# Patient Record
Sex: Female | Born: 1945 | Race: Black or African American | Hispanic: No | Smoking: Former smoker
Health system: Southern US, Community
[De-identification: ages and names within clinical notes are randomized; demographics above are authoritative.]

## PROBLEM LIST (undated history)

## (undated) DIAGNOSIS — M069 Rheumatoid arthritis, unspecified: Secondary | ICD-10-CM

## (undated) DIAGNOSIS — M0520 Rheumatoid vasculitis with rheumatoid arthritis of unspecified site: Secondary | ICD-10-CM

## (undated) DIAGNOSIS — I10 Essential (primary) hypertension: Secondary | ICD-10-CM

## (undated) DIAGNOSIS — M1990 Unspecified osteoarthritis, unspecified site: Secondary | ICD-10-CM

## (undated) DIAGNOSIS — I779 Disorder of arteries and arterioles, unspecified: Secondary | ICD-10-CM

## (undated) DIAGNOSIS — I739 Peripheral vascular disease, unspecified: Secondary | ICD-10-CM

## (undated) DIAGNOSIS — K922 Gastrointestinal hemorrhage, unspecified: Secondary | ICD-10-CM

## (undated) DIAGNOSIS — N329 Bladder disorder, unspecified: Secondary | ICD-10-CM

## (undated) DIAGNOSIS — L309 Dermatitis, unspecified: Secondary | ICD-10-CM

## (undated) DIAGNOSIS — I509 Heart failure, unspecified: Secondary | ICD-10-CM

## (undated) DIAGNOSIS — K635 Polyp of colon: Secondary | ICD-10-CM

## (undated) DIAGNOSIS — I2510 Atherosclerotic heart disease of native coronary artery without angina pectoris: Secondary | ICD-10-CM

## (undated) DIAGNOSIS — E7800 Pure hypercholesterolemia, unspecified: Secondary | ICD-10-CM

## (undated) HISTORY — DX: Dermatitis, unspecified: L30.9

## (undated) HISTORY — PX: CAROTID ENDARTERECTOMY: SUR193

## (undated) HISTORY — PX: CORONARY ANGIOPLASTY WITH STENT PLACEMENT: SHX49

---

## 2008-01-17 ENCOUNTER — Emergency Department (HOSPITAL_BASED_OUTPATIENT_CLINIC_OR_DEPARTMENT_OTHER): Admission: EM | Admit: 2008-01-17 | Discharge: 2008-01-17 | Payer: Self-pay | Admitting: Emergency Medicine

## 2008-06-09 ENCOUNTER — Emergency Department (HOSPITAL_BASED_OUTPATIENT_CLINIC_OR_DEPARTMENT_OTHER): Admission: EM | Admit: 2008-06-09 | Discharge: 2008-06-09 | Payer: Self-pay | Admitting: Emergency Medicine

## 2009-03-05 ENCOUNTER — Emergency Department (HOSPITAL_BASED_OUTPATIENT_CLINIC_OR_DEPARTMENT_OTHER): Admission: EM | Admit: 2009-03-05 | Discharge: 2009-03-05 | Payer: Self-pay | Admitting: Emergency Medicine

## 2009-09-29 ENCOUNTER — Emergency Department (HOSPITAL_BASED_OUTPATIENT_CLINIC_OR_DEPARTMENT_OTHER)
Admission: EM | Admit: 2009-09-29 | Discharge: 2009-09-29 | Payer: Self-pay | Source: Home / Self Care | Admitting: Emergency Medicine

## 2010-06-30 ENCOUNTER — Ambulatory Visit (INDEPENDENT_AMBULATORY_CARE_PROVIDER_SITE_OTHER)
Admission: RE | Admit: 2010-06-30 | Discharge: 2010-06-30 | Payer: Self-pay | Source: Home / Self Care | Attending: Obstetrics and Gynecology | Admitting: Obstetrics and Gynecology

## 2010-06-30 DIAGNOSIS — Z1231 Encounter for screening mammogram for malignant neoplasm of breast: Secondary | ICD-10-CM

## 2010-08-19 LAB — COMPREHENSIVE METABOLIC PANEL
ALT: 27 U/L (ref 0–35)
BUN: 16 mg/dL (ref 6–23)
CO2: 26 mEq/L (ref 19–32)
Calcium: 9.4 mg/dL (ref 8.4–10.5)
Creatinine, Ser: 0.9 mg/dL (ref 0.4–1.2)
GFR calc non Af Amer: >60 mL/min (ref >60)
Glucose, Bld: 83 mg/dL (ref 70–99)
Sodium: 142 mEq/L (ref 135–145)

## 2010-08-19 LAB — DIFFERENTIAL
Eosinophils Absolute: 0.1 10*3/uL (ref 0.0–0.7)
Lymphs Abs: 1.4 10*3/uL (ref 0.7–4.0)
Neutro Abs: 3 10*3/uL (ref 1.7–7.7)
Neutrophils Relative %: 59 % (ref 43–77)

## 2010-08-19 LAB — URINALYSIS, ROUTINE W REFLEX MICROSCOPIC
Bilirubin Urine: NEGATIVE
Glucose, UA: NEGATIVE mg/dL
Hgb urine dipstick: NEGATIVE
Ketones, ur: NEGATIVE mg/dL
Specific Gravity, Urine: 1.009 (ref 1.005–1.030)
pH: 7 (ref 5.0–8.0)

## 2010-08-19 LAB — CBC
HCT: 37.2 % (ref 36.0–46.0)
Hemoglobin: 12.4 g/dL (ref 12.0–15.0)
MCHC: 33.3 g/dL (ref 30.0–36.0)
MCV: 92.2 fL (ref 78.0–100.0)
RBC: 4.03 MIL/uL (ref 3.87–5.11)
RDW: 14.3 % (ref 11.5–15.5)

## 2010-08-19 LAB — POCT CARDIAC MARKERS
CKMB, poc: 2.8 ng/mL (ref 1.0–8.0)
Myoglobin, poc: 80.1 ng/mL (ref 12–200)

## 2010-09-04 LAB — URINE CULTURE
Colony Count: NO GROWTH
Culture: NO GROWTH

## 2010-09-04 LAB — URINALYSIS, ROUTINE W REFLEX MICROSCOPIC
Glucose, UA: NEGATIVE mg/dL
Hgb urine dipstick: NEGATIVE
Ketones, ur: NEGATIVE mg/dL
Protein, ur: NEGATIVE mg/dL

## 2010-09-04 LAB — URINE MICROSCOPIC-ADD ON

## 2011-05-21 ENCOUNTER — Other Ambulatory Visit: Payer: Self-pay | Admitting: Obstetrics and Gynecology

## 2011-05-21 ENCOUNTER — Other Ambulatory Visit (HOSPITAL_COMMUNITY)
Admission: RE | Admit: 2011-05-21 | Discharge: 2011-05-21 | Disposition: A | Discharge status: Home / Self Care | Payer: Medicare Other | Source: Ambulatory Visit | Attending: Obstetrics and Gynecology | Admitting: Obstetrics and Gynecology

## 2011-05-21 DIAGNOSIS — Z1231 Encounter for screening mammogram for malignant neoplasm of breast: Secondary | ICD-10-CM

## 2011-05-21 DIAGNOSIS — Z124 Encounter for screening for malignant neoplasm of cervix: Secondary | ICD-10-CM | POA: Insufficient documentation

## 2011-07-02 ENCOUNTER — Inpatient Hospital Stay (HOSPITAL_BASED_OUTPATIENT_CLINIC_OR_DEPARTMENT_OTHER): Admission: RE | Admit: 2011-07-02 | Payer: Medicare Other | Source: Ambulatory Visit

## 2011-07-08 ENCOUNTER — Ambulatory Visit (HOSPITAL_BASED_OUTPATIENT_CLINIC_OR_DEPARTMENT_OTHER)
Admission: RE | Admit: 2011-07-08 | Discharge: 2011-07-08 | Disposition: A | Discharge status: Home / Self Care | Payer: Medicare Other | Source: Ambulatory Visit | Attending: Obstetrics and Gynecology | Admitting: Obstetrics and Gynecology

## 2011-07-08 DIAGNOSIS — Z1231 Encounter for screening mammogram for malignant neoplasm of breast: Secondary | ICD-10-CM

## 2011-08-05 ENCOUNTER — Ambulatory Visit (HOSPITAL_BASED_OUTPATIENT_CLINIC_OR_DEPARTMENT_OTHER): Payer: Medicare Other

## 2011-10-28 ENCOUNTER — Encounter (HOSPITAL_BASED_OUTPATIENT_CLINIC_OR_DEPARTMENT_OTHER): Payer: Self-pay

## 2011-10-28 ENCOUNTER — Emergency Department (HOSPITAL_BASED_OUTPATIENT_CLINIC_OR_DEPARTMENT_OTHER)
Admission: EM | Admit: 2011-10-28 | Discharge: 2011-10-28 | Disposition: A | Discharge status: Home / Self Care | Payer: Medicare Other | Attending: Emergency Medicine | Admitting: Emergency Medicine

## 2011-10-28 ENCOUNTER — Emergency Department (HOSPITAL_BASED_OUTPATIENT_CLINIC_OR_DEPARTMENT_OTHER): Payer: Medicare Other

## 2011-10-28 DIAGNOSIS — I10 Essential (primary) hypertension: Secondary | ICD-10-CM | POA: Insufficient documentation

## 2011-10-28 DIAGNOSIS — I779 Disorder of arteries and arterioles, unspecified: Secondary | ICD-10-CM | POA: Insufficient documentation

## 2011-10-28 DIAGNOSIS — Z9861 Coronary angioplasty status: Secondary | ICD-10-CM | POA: Insufficient documentation

## 2011-10-28 DIAGNOSIS — J329 Chronic sinusitis, unspecified: Secondary | ICD-10-CM | POA: Insufficient documentation

## 2011-10-28 HISTORY — DX: Disorder of arteries and arterioles, unspecified: I77.9

## 2011-10-28 HISTORY — DX: Pure hypercholesterolemia, unspecified: E78.00

## 2011-10-28 HISTORY — DX: Essential (primary) hypertension: I10

## 2011-10-28 HISTORY — DX: Peripheral vascular disease, unspecified: I73.9

## 2011-10-28 NOTE — ED Notes (Signed)
C/o blurred vision, dizzy x 6-7 min this am 1130am-HA started after s/s-denies visual disturbance at present

## 2011-10-28 NOTE — ED Notes (Signed)
Patient transported to CT 

## 2011-10-28 NOTE — ED Notes (Signed)
Pt denies dizziness at present "my head just hurts"-fast paced steady gait to tx area-EDP Ray in to see pt upon arrival to tx #11

## 2011-10-28 NOTE — ED Provider Notes (Signed)
History     CSN: [id number]  Arrival date & time 10/28/11  1552   First MD Initiated Contact with Patient 10/28/11 1601      Chief Complaint  Patient presents with  . Headache    (Consider location/radiation/quality/duration/timing/severity/associated sxs/prior treatment) HPI  Patient with episode of blurred vision describes as being slightly off balance lasted about 6 minutes today at 1100.  Patient then began having headache behind forehead at about 6/10 without radiation.  Headache improved now to 3 without intervention.  No associated symptoms.  Patient had a similar episode last year but didn't find anything wrong.  PMD Dr. Jari Favre at [facility].  Patient has history of sinusitis, patient has had recent congestion and nasal discharge.  No fever.  Takes zyrtec daily.   Past Medical History  Diagnosis Date  . Carotid arterial disease   . Hypertension   . High cholesterol     Past Surgical History  Procedure Date  . Coronary angioplasty with stent placement     No family history on file.  History  Substance Use Topics  . Smoking status: Never Smoker   . Smokeless tobacco: Not on file  . Alcohol Use: No    OB History    Grav Para Term Preterm Abortions TAB SAB Ect Mult Living                  Review of Systems  Constitutional: Negative.   HENT: Positive for congestion.   Respiratory: Negative.   Cardiovascular: Negative.   Gastrointestinal: Negative.   Genitourinary: Negative.   Musculoskeletal: Negative.   Neurological: Positive for dizziness and headaches. Negative for tremors, seizures, syncope, facial asymmetry, speech difficulty, weakness, light-headedness and numbness.  Hematological: Negative.   Psychiatric/Behavioral: Negative.   All other systems reviewed and are negative.    Allergies  Review of patient's allergies indicates no known allergies.  Home Medications  No current outpatient prescriptions on file.  BP 132/50  Pulse 56   Temp(Src) 98.6 F (37 C) (Oral)  Resp 18  Ht 5\' 8"  (1.727 m)  Wt 175 lb (79.379 kg)  BMI 26.61 kg/m2  SpO2 99%  Physical Exam  Nursing note and vitals reviewed. Constitutional: She is oriented to person, place, and time. She appears well-developed and well-nourished.  HENT:  Head: Normocephalic and atraumatic.  Eyes: Conjunctivae and EOM are normal. Pupils are equal, round, and reactive to light.  Neck: Normal range of motion. Neck supple.       Bilateral bruits  Cardiovascular: Normal rate, regular rhythm, normal heart sounds and intact distal pulses.   Pulmonary/Chest: Effort normal and breath sounds normal.  Abdominal: Soft. Bowel sounds are normal.  Musculoskeletal: Normal range of motion.  Neurological: She is alert and oriented to person, place, and time. She has normal strength and normal reflexes. No sensory deficit. She displays a negative Romberg sign. GCS eye subscore is 4. GCS verbal subscore is 5. GCS motor subscore is 6.  Skin: Skin is warm and dry.  Psychiatric: She has a normal mood and affect. Thought content normal.    ED Course  Procedures (including critical care time)  Labs Reviewed - No data to display Ct Head Wo Contrast  10/28/2011  *RADIOLOGY REPORT*  Clinical Data: Frontal headache  CT HEAD WITHOUT CONTRAST  Technique:  Contiguous axial images were obtained from the base of the skull through the vertex without contrast.  Comparison: None.  Findings: No mass effect, midline shift, or acute intracranial hemorrhage.  Mild  chronic ischemic changes in the periventricular white matter.  Mild global atrophy.  Mastoid air cells are clear. Cranium is intact.  Visualized paranasal sinuses are clear.  IMPRESSION: No acute intracranial pathology.  Original Report Authenticated By: Donavan Burnet, M.D.     No diagnosis found.    MDM   Patient with known carotid disease. She is followed for this. Symptoms today do not appear to been consistent with stroke. They  resolved after 6 minutes and she has a normal neurological exam at this time. Symptoms associated with headache but without acute sinusitis on scan. I think her headache is likely associated with some sinus inflammation from her seasonal allergies. Patient is advised to return or she has any return of symptoms. CT scan results discussed with patient. She will call her primary care Dr.      Hilario Quarry, MD 10/28/11 [phone]

## 2011-10-28 NOTE — Discharge Instructions (Signed)
Headache and Allergies  The relationship between allergies and headaches is unclear. Many people with allergic or infectious nasal problems also have headaches (migraines or sinus headaches). However, sometimes allergies can cause pressure that feels like a headache, and sometimes headaches can cause allergy-like symptoms. It is not always clear whether your symptoms are caused by allergies or by a headache.  CAUSES    Migraine: The cause of a migraine is not always known.   Sinus Headache: The cause of a sinus headache may be a sinus infection. Other conditions that may be related to sinus headaches include:   Hay fever (allergic rhinitis).   Deviation of the nasal septum.   Swelling or clogging of the nasal passages.  SYMPTOMS   Migraine headache symptoms (which often last 4 to 72 hours) include:   Intense, throbbing pain on one or both sides of the head.   Nausea.   Vomiting.   Being extra sensitive to light.   Being extra sensitive to sound.   Nervous system reactions that appear similar to an allergic reaction:   Stuffy nose.   Runny nose.   Tearing.  Sinus headaches are felt as facial pain or pressure.   DIAGNOSIS   Because there is some overlap in symptoms, sinus and migraine headaches are often misdiagnosed. For example, a person with migraines may also feel facial pressure. Likewise, many people with hay fever may get migraine headaches rather than sinus headaches. These migraines can be triggered by the histamine release during an allergic reaction. An antihistamine medicine can eliminate this pain.  There are standard criteria that help clarify the difference between these headaches and related allergy or allergy-like symptoms. Your caregiver can use these criteria to determine the proper diagnosis and provide you the best care.  TREATMENT   Migraine medicine may help people who have persistent migraine headaches even though their hay fever is controlled. For some people, anti-inflammatory  treatments do not work to relieve migraines. Medicines called triptans (such as sumatriptan) can be helpful for those people.  Document Released: 08/08/2003 Document Revised: 05/07/2011 Document Reviewed: 08/30/2009  [employer] Patient Information 2012 [employer].

## 2011-12-01 ENCOUNTER — Emergency Department (HOSPITAL_BASED_OUTPATIENT_CLINIC_OR_DEPARTMENT_OTHER): Payer: Medicare Other

## 2011-12-01 ENCOUNTER — Emergency Department (HOSPITAL_BASED_OUTPATIENT_CLINIC_OR_DEPARTMENT_OTHER)
Admission: EM | Admit: 2011-12-01 | Discharge: 2011-12-01 | Disposition: A | Discharge status: Home / Self Care | Payer: Medicare Other | Attending: Emergency Medicine | Admitting: Emergency Medicine

## 2011-12-01 ENCOUNTER — Encounter (HOSPITAL_BASED_OUTPATIENT_CLINIC_OR_DEPARTMENT_OTHER): Payer: Self-pay | Admitting: Emergency Medicine

## 2011-12-01 DIAGNOSIS — I10 Essential (primary) hypertension: Secondary | ICD-10-CM | POA: Insufficient documentation

## 2011-12-01 DIAGNOSIS — E7800 Pure hypercholesterolemia, unspecified: Secondary | ICD-10-CM | POA: Insufficient documentation

## 2011-12-01 DIAGNOSIS — M25569 Pain in unspecified knee: Secondary | ICD-10-CM | POA: Insufficient documentation

## 2011-12-01 DIAGNOSIS — Z79899 Other long term (current) drug therapy: Secondary | ICD-10-CM | POA: Insufficient documentation

## 2011-12-01 DIAGNOSIS — M25562 Pain in left knee: Secondary | ICD-10-CM

## 2011-12-01 MED ORDER — HYDROCODONE-ACETAMINOPHEN 5-500 MG PO TABS: 1.0000 | ORAL_TABLET | Freq: Four times a day (QID) | ORAL | Status: AC | PRN

## 2011-12-01 NOTE — ED Provider Notes (Signed)
History     CSN: [id number]  Arrival date & time 12/01/11  0900   First MD Initiated Contact with Patient 12/01/11 [phone]      Chief Complaint  Patient presents with  . Knee Pain    (Consider location/radiation/quality/duration/timing/severity/associated sxs/prior treatment) HPI Comments: Started yesterday with knee pain.  No injury or trauma.  Does part-time work as in Microbiologist.  Went to work yesterday and started then.  Patient is a 66 y.o. female presenting with knee pain. The history is provided by the patient.  Knee Pain This is a new problem. The current episode started yesterday. The problem occurs constantly. The problem has been gradually worsening. The symptoms are aggravated by walking (range of motion). Nothing relieves the symptoms. Treatments tried: aleve. The treatment provided mild relief.    Past Medical History  Diagnosis Date  . Carotid arterial disease   . Hypertension   . High cholesterol     Past Surgical History  Procedure Date  . Coronary angioplasty with stent placement     No family history on file.  History  Substance Use Topics  . Smoking status: Never Smoker   . Smokeless tobacco: Not on file  . Alcohol Use: No    OB History    Grav Para Term Preterm Abortions TAB SAB Ect Mult Living                  Review of Systems  All other systems reviewed and are negative.    Allergies  Review of patient's allergies indicates no known allergies.  Home Medications   Current Outpatient Rx  Name Route Sig Dispense Refill  . CELECOXIB 50 MG PO CAPS Oral Take 50 mg by mouth daily.    . ATORVASTATIN CALCIUM 80 MG PO TABS Oral Take 80 mg by mouth daily.    Marland Kitchen ZYRTEC PO Oral Take 1 tablet by mouth daily as needed. Patient is using this medication for her allergies.    Marland Kitchen CLOPIDOGREL BISULFATE 75 MG PO TABS Oral Take 75 mg by mouth daily.    Marland Kitchen EZETIMIBE 10 MG PO TABS Oral Take 10 mg by mouth daily.    . ISOSORBIDE MONONITRATE ER 30 MG PO  TB24 Oral Take 30 mg by mouth daily.    Marland Kitchen METOPROLOL SUCCINATE ER 50 MG PO TB24 Oral Take 50 mg by mouth daily. Take with or immediately following a meal.      BP 150/62  Pulse 55  Temp 98.2 F (36.8 C) (Oral)  Resp 16  SpO2 100%  Physical Exam  Nursing note and vitals reviewed. Constitutional: She is oriented to person, place, and time. She appears well-developed and well-nourished. No distress.  HENT:  Head: Normocephalic and atraumatic.  Neck: Normal range of motion. Neck supple.  Musculoskeletal:       The left knee appears grossly normal.  There is no deformity or effusion.  There is pain with range of motion but no crepitus.  It is stable ap and laterally.  Neurological: She is alert and oriented to person, place, and time.  Skin: Skin is warm and dry. She is not diaphoretic.    ED Course  Procedures (including critical care time)  Labs Reviewed - No data to display No results found.   No diagnosis found.    MDM  The xrays show mild djd.  Will discharge to home with nsaids, lortab.  To follow up if not improving in the next week.  Geoffery Lyons, MD 12/01/11 1013

## 2011-12-01 NOTE — Discharge Instructions (Signed)
Knee Pain The knee is the complex joint between your thigh and your lower leg. It is made up of bones, tendons, ligaments, and cartilage. The bones that make up the knee are:  The femur in the thigh.   The tibia and fibula in the lower leg.   The patella or kneecap riding in the groove on the lower femur.  CAUSES  Knee pain is a common complaint with many causes. A few of these causes are:  Injury, such as:   A ruptured ligament or tendon injury.   Torn cartilage.   Medical conditions, such as:   Gout   Arthritis   Infections   Overuse, over training or overdoing a physical activity.  Knee pain can be minor or severe. Knee pain can accompany debilitating injury. Minor knee problems often respond well to self-care measures or get well on their own. More serious injuries may need medical intervention or even surgery. SYMPTOMS The knee is complex. Symptoms of knee problems can vary widely. Some of the problems are:  Pain with movement and weight bearing.   Swelling and tenderness.   Buckling of the knee.   Inability to straighten or extend your knee.   Your knee locks and you cannot straighten it.   Warmth and redness with pain and fever.   Deformity or dislocation of the kneecap.  DIAGNOSIS  Determining what is wrong may be very straight forward such as when there is an injury. It can also be challenging because of the complexity of the knee. Tests to make a diagnosis may include:  Your caregiver taking a history and doing a physical exam.   Routine X-rays can be used to rule out other problems. X-rays will not reveal a cartilage tear. Some injuries of the knee can be diagnosed by:   Arthroscopy a surgical technique by which a small video camera is inserted through tiny incisions on the sides of the knee. This procedure is used to examine and repair internal knee joint problems. Tiny instruments can be used during arthroscopy to repair the torn knee cartilage  (meniscus).   Arthrography is a radiology technique. A contrast liquid is directly injected into the knee joint. Internal structures of the knee joint then become visible on X-ray film.   An MRI scan is a non x-ray radiology procedure in which magnetic fields and a computer produce two- or three-dimensional images of the inside of the knee. Cartilage tears are often visible using an MRI scanner. MRI scans have largely replaced arthrography in diagnosing cartilage tears of the knee.   Blood work.   Examination of the fluid that helps to lubricate the knee joint (synovial fluid). This is done by taking a sample out using a needle and a syringe.  TREATMENT The treatment of knee problems depends on the cause. Some of these treatments are:  Depending on the injury, proper casting, splinting, surgery or physical therapy care will be needed.   Give yourself adequate recovery time. Do not overuse your joints. If you begin to get sore during workout routines, back off. Slow down or do fewer repetitions.   For repetitive activities such as cycling or running, maintain your strength and nutrition.   Alternate muscle groups. For example if you are a weight lifter, work the upper body on one day and the lower body the next.   Either tight or weak muscles do not give the proper support for your knee. Tight or weak muscles do not absorb the stress placed   on the knee joint. Keep the muscles surrounding the knee strong.   Take care of mechanical problems.   If you have flat feet, orthotics or special shoes may help. See your caregiver if you need help.   Arch supports, sometimes with wedges on the inner or outer aspect of the heel, can help. These can shift pressure away from the side of the knee most bothered by osteoarthritis.   A brace called an "unloader" brace also may be used to help ease the pressure on the most arthritic side of the knee.   If your caregiver has prescribed crutches, braces,  wraps or ice, use as directed. The acronym for this is PRICE. This means protection, rest, ice, compression and elevation.   Nonsteroidal anti-inflammatory drugs (NSAID's), can help relieve pain. But if taken immediately after an injury, they may actually increase swelling. Take NSAID's with food in your stomach. Stop them if you develop stomach problems. Do not take these if you have a history of ulcers, stomach pain or bleeding from the bowel. Do not take without your caregiver's approval if you have problems with fluid retention, heart failure, or kidney problems.   For ongoing knee problems, physical therapy may be helpful.   Glucosamine and chondroitin are over-the-counter dietary supplements. Both may help relieve the pain of osteoarthritis in the knee. These medicines are different from the usual anti-inflammatory drugs. Glucosamine may decrease the rate of cartilage destruction.   Injections of a corticosteroid drug into your knee joint may help reduce the symptoms of an arthritis flare-up. They may provide pain relief that lasts a few months. You may have to wait a few months between injections. The injections do have a small increased risk of infection, water retention and elevated blood sugar levels.   Hyaluronic acid injected into damaged joints may ease pain and provide lubrication. These injections may work by reducing inflammation. A series of shots may give relief for as long as 6 months.   Topical painkillers. Applying certain ointments to your skin may help relieve the pain and stiffness of osteoarthritis. Ask your pharmacist for suggestions. Many over the-counter products are approved for temporary relief of arthritis pain.   In some countries, doctors often prescribe topical NSAID's for relief of chronic conditions such as arthritis and tendinitis. A review of treatment with NSAID creams found that they worked as well as oral medications but without the serious side effects.    PREVENTION  Maintain a healthy weight. Extra pounds put more strain on your joints.   Get strong, stay limber. Weak muscles are a common cause of knee injuries. Stretching is important. Include flexibility exercises in your workouts.   Be smart about exercise. If you have osteoarthritis, chronic knee pain or recurring injuries, you may need to change the way you exercise. This does not mean you have to stop being active. If your knees ache after jogging or playing basketball, consider switching to swimming, water aerobics or other low-impact activities, at least for a few days a week. Sometimes limiting high-impact activities will provide relief.   Make sure your shoes fit well. Choose footwear that is right for your sport.   Protect your knees. Use the proper gear for knee-sensitive activities. Use kneepads when playing volleyball or laying carpet. Buckle your seat belt every time you drive. Most shattered kneecaps occur in car accidents.   Rest when you are tired.  SEEK MEDICAL CARE IF:  You have knee pain that is continual and does not   seem to be getting better.  SEEK IMMEDIATE MEDICAL CARE IF:  Your knee joint feels hot to the touch and you have a high fever. MAKE SURE YOU:   Understand these instructions.   Will watch your condition.   Will get help right away if you are not doing well or get worse.  Document Released: 03/15/2007 Document Revised: 05/07/2011 Document Reviewed: 03/15/2007 [employer] Patient Information 2012 [employer]. 

## 2011-12-01 NOTE — ED Notes (Signed)
Pt c/o LT knee pain since yest; no injury

## 2012-05-27 ENCOUNTER — Emergency Department (HOSPITAL_BASED_OUTPATIENT_CLINIC_OR_DEPARTMENT_OTHER)
Admission: EM | Admit: 2012-05-27 | Discharge: 2012-05-27 | Disposition: A | Discharge status: Home / Self Care | Payer: Medicare Other | Attending: Emergency Medicine | Admitting: Emergency Medicine

## 2012-05-27 ENCOUNTER — Encounter (HOSPITAL_BASED_OUTPATIENT_CLINIC_OR_DEPARTMENT_OTHER): Payer: Self-pay | Admitting: *Deleted

## 2012-05-27 ENCOUNTER — Emergency Department (HOSPITAL_BASED_OUTPATIENT_CLINIC_OR_DEPARTMENT_OTHER): Payer: Medicare Other

## 2012-05-27 DIAGNOSIS — R6883 Chills (without fever): Secondary | ICD-10-CM | POA: Insufficient documentation

## 2012-05-27 DIAGNOSIS — R5381 Other malaise: Secondary | ICD-10-CM | POA: Insufficient documentation

## 2012-05-27 DIAGNOSIS — R05 Cough: Secondary | ICD-10-CM | POA: Insufficient documentation

## 2012-05-27 DIAGNOSIS — J329 Chronic sinusitis, unspecified: Secondary | ICD-10-CM

## 2012-05-27 DIAGNOSIS — R059 Cough, unspecified: Secondary | ICD-10-CM | POA: Insufficient documentation

## 2012-05-27 DIAGNOSIS — Z7902 Long term (current) use of antithrombotics/antiplatelets: Secondary | ICD-10-CM | POA: Insufficient documentation

## 2012-05-27 DIAGNOSIS — N390 Urinary tract infection, site not specified: Secondary | ICD-10-CM

## 2012-05-27 DIAGNOSIS — E860 Dehydration: Secondary | ICD-10-CM | POA: Insufficient documentation

## 2012-05-27 DIAGNOSIS — J3489 Other specified disorders of nose and nasal sinuses: Secondary | ICD-10-CM | POA: Insufficient documentation

## 2012-05-27 DIAGNOSIS — J029 Acute pharyngitis, unspecified: Secondary | ICD-10-CM | POA: Insufficient documentation

## 2012-05-27 DIAGNOSIS — Z79899 Other long term (current) drug therapy: Secondary | ICD-10-CM | POA: Insufficient documentation

## 2012-05-27 DIAGNOSIS — I6529 Occlusion and stenosis of unspecified carotid artery: Secondary | ICD-10-CM | POA: Insufficient documentation

## 2012-05-27 DIAGNOSIS — E7800 Pure hypercholesterolemia, unspecified: Secondary | ICD-10-CM | POA: Insufficient documentation

## 2012-05-27 DIAGNOSIS — Z9861 Coronary angioplasty status: Secondary | ICD-10-CM | POA: Insufficient documentation

## 2012-05-27 DIAGNOSIS — R42 Dizziness and giddiness: Secondary | ICD-10-CM | POA: Insufficient documentation

## 2012-05-27 LAB — CBC WITH DIFFERENTIAL/PLATELET
Basophils Relative: 0 % (ref 0–1)
Eosinophils Absolute: 0.1 10*3/uL (ref 0.0–0.7)
MCH: 29.2 pg (ref 26.0–34.0)
MCHC: 32.2 g/dL (ref 30.0–36.0)
Neutrophils Relative %: 53 % (ref 43–77)
Platelets: 218 10*3/uL (ref 150–400)
RDW: 15.5 % (ref 11.5–15.5)

## 2012-05-27 LAB — URINALYSIS, ROUTINE W REFLEX MICROSCOPIC
Glucose, UA: NEGATIVE mg/dL
Ketones, ur: NEGATIVE mg/dL
pH: 6.5 (ref 5.0–8.0)

## 2012-05-27 LAB — COMPREHENSIVE METABOLIC PANEL
ALT: 45 U/L — ABNORMAL HIGH (ref 0–35)
Albumin: 3.1 g/dL — ABNORMAL LOW (ref 3.5–5.2)
Alkaline Phosphatase: 61 U/L (ref 39–117)
BUN: 17 mg/dL (ref 6–23)
Potassium: 4 mEq/L (ref 3.5–5.1)
Sodium: 137 mEq/L (ref 135–145)
Total Protein: 6.5 g/dL (ref 6.0–8.3)

## 2012-05-27 LAB — URINE MICROSCOPIC-ADD ON

## 2012-05-27 MED ORDER — SODIUM CHLORIDE 0.9 % IV BOLUS (SEPSIS)
1000.0000 mL | Freq: Once | INTRAVENOUS | Status: AC
  Administered 2012-05-27: 1000 mL via INTRAVENOUS

## 2012-05-27 MED ORDER — SODIUM CHLORIDE 0.9 % IV BOLUS (SEPSIS): 1000.0000 mL | Freq: Once | INTRAVENOUS | Status: DC

## 2012-05-27 MED ORDER — AMOXICILLIN-POT CLAVULANATE 875-125 MG PO TABS: 1.0000 | ORAL_TABLET | Freq: Two times a day (BID) | ORAL | Status: DC

## 2012-05-27 NOTE — ED Notes (Signed)
Pt amb to triage with quick steady gait in nad. Pt reports she checked her bp at home on her machine and it was 70/30. Pt states she has had a cold this week for which her pcp rx antibiotics, and that she has a headache.

## 2012-05-27 NOTE — ED Notes (Signed)
Pt. Has no complaints of nausea or vomiting or diarrhea.  Pt. Has cool dry skin and is in no distress.

## 2012-05-27 NOTE — ED Notes (Signed)
Pt. Daughter left to go out for food.  Pt. Daughter aware of Pt. Needs for fluid IV and will return for the Pt. Later..  Pt. Daughter given info for her to call and check on her mother if needed.

## 2012-05-27 NOTE — ED Notes (Signed)
Patient to restroom without difficulty or assistance

## 2012-05-27 NOTE — ED Provider Notes (Addendum)
History     CSN: [id number]  Arrival date & time 05/27/12  1521   First MD Initiated Contact with Patient 05/27/12 1534      Chief Complaint  Patient presents with  . Hypotension    (Consider location/radiation/quality/duration/timing/severity/associated sxs/prior treatment) Patient is a 66 y.o. female presenting with weakness. The history is provided by the patient.  Weakness The primary symptoms include dizziness. Primary symptoms do not include nausea or vomiting. The symptoms began 6 to 12 hours ago. The symptoms are improving. The neurological symptoms are diffuse. Context: States she was sitting in a chair today when she started feeling dizzy. She took her blood pressure and it was 70/30. She did take her Imdur were and Toprol-XL today like normal.  She describes the dizziness as faintness. The dizziness began today. The dizziness has been gradually improving since its onset. It is a new problem. Dizziness also occurs with weakness. Dizziness does not occur with blurred vision, nausea or vomiting.  Additional symptoms include weakness. Additional symptoms do not include loss of balance. Medical issues also include hypertension. Associated medical issues comments: States she took her blood pressure medications today but this has happened in the past when her medications were too high. She states over last week she's had URI symptoms and was placed on penicillin.    Past Medical History  Diagnosis Date  . Carotid arterial disease   . Hypertension   . High cholesterol     Past Surgical History  Procedure Date  . Coronary angioplasty with stent placement     History reviewed. No pertinent family history.  History  Substance Use Topics  . Smoking status: Never Smoker   . Smokeless tobacco: Not on file  . Alcohol Use: No    OB History    Grav Para Term Preterm Abortions TAB SAB Ect Mult Living                  Review of Systems  Constitutional: Positive for chills.  Negative for appetite change.  HENT: Positive for congestion, sore throat, rhinorrhea and sinus pressure.   Eyes: Negative for blurred vision.  Respiratory: Positive for cough. Negative for shortness of breath.   Gastrointestinal: Negative for nausea and vomiting.  Neurological: Positive for dizziness and weakness. Negative for loss of balance.  All other systems reviewed and are negative.    Allergies  Review of patient's allergies indicates no known allergies.  Home Medications   Current Outpatient Rx  Name  Route  Sig  Dispense  Refill  . ATORVASTATIN CALCIUM 80 MG PO TABS   Oral   Take 80 mg by mouth daily.         . CELECOXIB 50 MG PO CAPS   Oral   Take 50 mg by mouth daily.         Marland Kitchen ZYRTEC PO   Oral   Take 1 tablet by mouth daily as needed. Patient is using this medication for her allergies.         Marland Kitchen CLOPIDOGREL BISULFATE 75 MG PO TABS   Oral   Take 75 mg by mouth daily.         Marland Kitchen EZETIMIBE 10 MG PO TABS   Oral   Take 10 mg by mouth daily.         . ISOSORBIDE MONONITRATE ER 30 MG PO TB24   Oral   Take 30 mg by mouth daily.         Marland Kitchen METOPROLOL SUCCINATE  ER 50 MG PO TB24   Oral   Take 50 mg by mouth daily. Take with or immediately following a meal.           BP 92/63  Pulse 62  Temp 99 F (37.2 C) (Oral)  Resp 18  SpO2 99%  Physical Exam  Nursing note and vitals reviewed. Constitutional: She is oriented to person, place, and time. She appears well-developed and well-nourished. No distress.  HENT:  Head: Normocephalic and atraumatic.  Nose: Mucosal edema and rhinorrhea present.  Mouth/Throat: Oropharynx is clear and moist.  Eyes: Conjunctivae normal and EOM are normal. Pupils are equal, round, and reactive to light.  Neck: Normal range of motion. Neck supple.  Cardiovascular: Normal rate, regular rhythm and intact distal pulses.   Murmur heard.  Crescendo systolic murmur is present with a grade of 2/6  Pulmonary/Chest: Effort  normal and breath sounds normal. No respiratory distress. She has no wheezes. She has no rales.  Abdominal: Soft. She exhibits no distension. There is no tenderness. There is no rebound and no guarding.  Musculoskeletal: Normal range of motion. She exhibits no edema and no tenderness.  Neurological: She is alert and oriented to person, place, and time.  Skin: Skin is warm and dry. No rash noted. No erythema.  Psychiatric: She has a normal mood and affect. Her behavior is normal.    ED Course  Procedures (including critical care time)  Labs Reviewed  CBC WITH DIFFERENTIAL - Abnormal; Notable for the following:    Monocytes Relative 16 (*)     All other components within normal limits  COMPREHENSIVE METABOLIC PANEL - Abnormal; Notable for the following:    Albumin 3.1 (*)     ALT 45 (*)     GFR calc non Af Amer 51 (*)     GFR calc Af Amer 59 (*)     All other components within normal limits  URINALYSIS, ROUTINE W REFLEX MICROSCOPIC - Abnormal; Notable for the following:    APPearance CLOUDY (*)     Leukocytes, UA SMALL (*)     All other components within normal limits  URINE MICROSCOPIC-ADD ON - Abnormal; Notable for the following:    Squamous Epithelial / LPF FEW (*)     All other components within normal limits  LACTIC ACID, PLASMA   Dg Chest 2 View  05/27/2012  *RADIOLOGY REPORT*  Clinical Data: Cough and fever.  CHEST - 2 VIEW  Comparison: No priors.  Findings: Lung volumes are normal.  No consolidative airspace disease.  No pleural effusions.  No pneumothorax.  No pulmonary nodule or mass noted.  Pulmonary vasculature and the cardiomediastinal silhouette are within normal limits. Atherosclerosis in the thoracic aorta.  IMPRESSION: 1. No radiographic evidence of acute cardiopulmonary disease. 2.  Atherosclerosis.   Original Report Authenticated By: Trudie Reed, M.D.      1. Dehydration   2. Sinusitis   3. UTI (lower urinary tract infection)       MDM   Patient  states today that she has felt dizzy and checked her blood pressure and it was 70/30. She states for the last week she's had URI symptoms with cough, nasal congestion. She denies fever, shortness of breath, chest pain, abdominal pain, vomiting or diarrhea.  She is well-appearing on exam with a blood pressure of 92/63 with a normal pulse and respiratory rate. She has a low-grade temp of 99.  Patient has taken both of her blood pressure medications today which is most  likely the reason for her hypotension and she has not been drinking as much as she normally does. CBC, CMP, UA, lactic acid, chest x-ray pending. Patient given 1 L of IV fluid to see if blood pressure response and improvement in symptoms. Patient is asymptomatic while laying in bed and was able to walk in without any issues.  5:59 PM Labs are within normal limits. Except for a UA that shows an early urinary tract infection to 3-6 white blood cells and small leukocytes. Patient feels much better after IV fluids and blood pressure has not changed significantly. She has a normal lactate and she is well-appearing. Will switch from penicillin to Augmentin to cover her sinus symptoms as well as urinary tract infection. Will have patient hold her dose of metoprolol tonight and hold all blood pressure medications until tomorrow and then have her check her blood pressure before she takes them. Will have her followup with her PCP on Monday      Gwyneth Sprout, MD 05/27/12 1800  Gwyneth Sprout, MD 05/27/12 1844

## 2012-05-31 ENCOUNTER — Other Ambulatory Visit: Payer: Self-pay | Admitting: Obstetrics and Gynecology

## 2012-05-31 DIAGNOSIS — Z1231 Encounter for screening mammogram for malignant neoplasm of breast: Secondary | ICD-10-CM

## 2012-07-11 ENCOUNTER — Ambulatory Visit (HOSPITAL_BASED_OUTPATIENT_CLINIC_OR_DEPARTMENT_OTHER)
Admission: RE | Admit: 2012-07-11 | Discharge: 2012-07-11 | Disposition: A | Discharge status: Home / Self Care | Payer: Medicare Other | Source: Ambulatory Visit | Attending: Obstetrics and Gynecology | Admitting: Obstetrics and Gynecology

## 2012-07-11 DIAGNOSIS — Z1231 Encounter for screening mammogram for malignant neoplasm of breast: Secondary | ICD-10-CM

## 2012-09-20 ENCOUNTER — Emergency Department (HOSPITAL_BASED_OUTPATIENT_CLINIC_OR_DEPARTMENT_OTHER)
Admission: EM | Admit: 2012-09-20 | Discharge: 2012-09-20 | Disposition: A | Discharge status: Home / Self Care | Payer: Medicare Other | Attending: Emergency Medicine | Admitting: Emergency Medicine

## 2012-09-20 ENCOUNTER — Emergency Department (HOSPITAL_BASED_OUTPATIENT_CLINIC_OR_DEPARTMENT_OTHER): Payer: Medicare Other

## 2012-09-20 ENCOUNTER — Encounter (HOSPITAL_BASED_OUTPATIENT_CLINIC_OR_DEPARTMENT_OTHER): Payer: Self-pay

## 2012-09-20 DIAGNOSIS — I10 Essential (primary) hypertension: Secondary | ICD-10-CM | POA: Insufficient documentation

## 2012-09-20 DIAGNOSIS — E7800 Pure hypercholesterolemia, unspecified: Secondary | ICD-10-CM | POA: Insufficient documentation

## 2012-09-20 DIAGNOSIS — Z7902 Long term (current) use of antithrombotics/antiplatelets: Secondary | ICD-10-CM | POA: Insufficient documentation

## 2012-09-20 DIAGNOSIS — Z8679 Personal history of other diseases of the circulatory system: Secondary | ICD-10-CM | POA: Insufficient documentation

## 2012-09-20 DIAGNOSIS — I951 Orthostatic hypotension: Secondary | ICD-10-CM | POA: Insufficient documentation

## 2012-09-20 DIAGNOSIS — R42 Dizziness and giddiness: Secondary | ICD-10-CM | POA: Insufficient documentation

## 2012-09-20 DIAGNOSIS — R51 Headache: Secondary | ICD-10-CM | POA: Insufficient documentation

## 2012-09-20 DIAGNOSIS — Z79899 Other long term (current) drug therapy: Secondary | ICD-10-CM | POA: Insufficient documentation

## 2012-09-20 LAB — CBC
HCT: 34.2 % — ABNORMAL LOW (ref 36.0–46.0)
MCH: 29.3 pg (ref 26.0–34.0)
MCV: 90.2 fL (ref 78.0–100.0)
Platelets: 224 10*3/uL (ref 150–400)
RBC: 3.79 MIL/uL — ABNORMAL LOW (ref 3.87–5.11)
WBC: 5.5 10*3/uL (ref 4.0–10.5)

## 2012-09-20 LAB — URINALYSIS, ROUTINE W REFLEX MICROSCOPIC
Bilirubin Urine: NEGATIVE
Glucose, UA: NEGATIVE mg/dL
Hgb urine dipstick: NEGATIVE
Ketones, ur: NEGATIVE mg/dL
Nitrite: NEGATIVE
Specific Gravity, Urine: 1.017 (ref 1.005–1.030)
pH: 6 (ref 5.0–8.0)

## 2012-09-20 LAB — BASIC METABOLIC PANEL
BUN: 14 mg/dL (ref 6–23)
CO2: 22 mEq/L (ref 19–32)
Calcium: 9.3 mg/dL (ref 8.4–10.5)
Chloride: 104 mEq/L (ref 96–112)
Creatinine, Ser: 1.1 mg/dL (ref 0.50–1.10)
Glucose, Bld: 126 mg/dL — ABNORMAL HIGH (ref 70–99)

## 2012-09-20 LAB — PRO B NATRIURETIC PEPTIDE: Pro B Natriuretic peptide (BNP): 110.5 pg/mL (ref 0–125)

## 2012-09-20 LAB — TROPONIN I: Troponin I: <0.3 ng/mL (ref <0.30)

## 2012-09-20 MED ORDER — ASPIRIN 81 MG PO CHEW: 324.0000 mg | CHEWABLE_TABLET | Freq: Once | ORAL | Status: DC

## 2012-09-20 MED ORDER — SODIUM CHLORIDE 0.9 % IV BOLUS (SEPSIS)
1000.0000 mL | Freq: Once | INTRAVENOUS | Status: AC
  Administered 2012-09-20: 1000 mL via INTRAVENOUS

## 2012-09-20 MED ORDER — ASPIRIN 325 MG PO TABS: 325.0000 mg | ORAL_TABLET | ORAL | Status: DC

## 2012-09-20 MED ORDER — ASPIRIN 81 MG PO CHEW
CHEWABLE_TABLET | ORAL | Status: AC
  Filled 2012-09-20: qty 4

## 2012-09-20 MED ORDER — IOHEXOL 350 MG/ML SOLN
100.0000 mL | Freq: Once | INTRAVENOUS | Status: AC | PRN
Start: 2012-09-20 — End: 2012-09-20
  Administered 2012-09-20: 100 mL via INTRAVENOUS

## 2012-09-20 MED ORDER — SODIUM CHLORIDE 0.9 % IV BOLUS (SEPSIS): 1000.0000 mL | Freq: Once | INTRAVENOUS | Status: DC

## 2012-09-20 NOTE — ED Notes (Signed)
Dr. Manus Gunning was in the room and knows about the low blood pressure.

## 2012-09-20 NOTE — ED Notes (Signed)
Patient transported to X-ray 

## 2012-09-20 NOTE — ED Notes (Signed)
MD at bedside. 

## 2012-09-20 NOTE — ED Notes (Signed)
Pt states that she been feeling dizzy since Saturday like she was about to pass out.  Pt states that she has also been experiencing low BPs at home (95/42 at one point).  Pt states that she became dizzy and diaphoretic about 1 hour ago.

## 2012-09-20 NOTE — ED Provider Notes (Signed)
History  This chart was scribed for Glynn Octave, MD by Bennett Scrape, ED Scribe. This patient was seen in room MH09/MH09 and the patient's care was started at 3:01 PM.  CSN: [id number]  Arrival date & time 09/20/12  1435   First MD Initiated Contact with Patient 09/20/12 1501      Chief Complaint  Patient presents with  . Near Syncope     The history is provided by the patient. No language interpreter was used.    Sara Villanueva is a 67 y.o. female who presents to the Emergency Department complaining of 2 episodes of near syncope with associated dizziness described as the room spinning and lightheadedness that both occured with changing positons. She states that 3 days ago she felt dizzy and lightheaded while walking around in her bedroom. She sat down with resolvement in the symptoms and had no recurrence of symptoms until about one hour ago. She states that the dizziness is worse with going from sitting position to standing but improves with standing for a few minutes. She denies feeling dizzy or lightheaded currently. She states that she has experienced one prior episode earlier this week and was told by her PCP to decrease her daily HTN medications when she experiences the symptoms. She reports that she has been eating and drinking normally since the onset. She denies fever, neck pain, sore throat, visual disturbance, CP, cough, SOB, abdominal pain, nausea, emesis, diarrhea, urinary symptoms, back pain, HA, weakness, numbness and rash as associated symptoms. She has a h/o CAD with 2 stents (2003, 2007), bilateral carotid blockages (50% on one side, 65% on the other), HTN and HLD.  She denies any prior diagnoses of CHF. Last stress was last year and pt reports that it was normal. She denies smoking and alcohol use.  PCP is Dr. Katrinka Blazing with [facility] 6 months for the carotid blockages  Past Medical History  Diagnosis Date  . Carotid arterial  disease   . Hypertension   . High cholesterol     Past Surgical History  Procedure Laterality Date  . Coronary angioplasty with stent placement      History reviewed. No pertinent family history.  History  Substance Use Topics  . Smoking status: Never Smoker   . Smokeless tobacco: Never Used  . Alcohol Use: No    No OB history provided.  Review of Systems  A complete 10 system review of systems was obtained and all systems are negative except as noted in the HPI and PMH.   Allergies  Review of patient's allergies indicates no known allergies.  Home Medications   Current Outpatient Rx  Name  Route  Sig  Dispense  Refill  . atorvastatin (LIPITOR) 80 MG tablet   Oral   Take 80 mg by mouth daily.         . Cetirizine HCl (ZYRTEC PO)   Oral   Take 1 tablet by mouth daily as needed. Patient is using this medication for her allergies.         Marland Kitchen clopidogrel (PLAVIX) 75 MG tablet   Oral   Take 75 mg by mouth daily.         Marland Kitchen ezetimibe (ZETIA) 10 MG tablet   Oral   Take 10 mg by mouth daily.         . isosorbide mononitrate (IMDUR) 30 MG 24 hr tablet   Oral   Take 30 mg by mouth daily.         Marland Kitchen  METOPROLOL SUCCINATE ER PO   Oral   Take 30 mg by mouth daily.         Marland Kitchen amoxicillin-clavulanate (AUGMENTIN) 875-125 MG per tablet   Oral   Take 1 tablet by mouth every 12 (twelve) hours.   14 tablet   0   . celecoxib (CELEBREX) 50 MG capsule   Oral   Take 50 mg by mouth daily.         . metoprolol succinate (TOPROL-XL) 50 MG 24 hr tablet   Oral   Take 50 mg by mouth daily. Take with or immediately following a meal.           Triage Vitals: BP 105/62  Pulse 88  Temp(Src) 98.3 F (36.8 C) (Oral)  Resp 20  Ht 5\' 8"  (1.727 m)  Wt 175 lb (79.379 kg)  BMI 26.61 kg/m2  SpO2 98%  Physical Exam  Nursing note and vitals reviewed. Constitutional: She is oriented to person, place, and time. She appears well-developed and well-nourished. No  distress.  HENT:  Head: Normocephalic and atraumatic.  Mouth/Throat: Oropharynx is clear and moist.  Eyes: Conjunctivae and EOM are normal. Pupils are equal, round, and reactive to light.  Neck: Neck supple. Carotid bruit is present (right). No tracheal deviation present.  Cardiovascular: Normal rate and regular rhythm.   No murmur heard. Pulmonary/Chest: Effort normal and breath sounds normal. No respiratory distress.  Abdominal: Soft. There is no tenderness.  Musculoskeletal: Normal range of motion. She exhibits no edema.  Neurological: She is alert and oriented to person, place, and time.  No ataxia on finger to nose. Equal grip strengths. 5/5 strength in bilateral lower extremities. Ankle plantar and dorsiflexion intact. Great toe extension intact bilaterally. +2 DP and PT pulses. +2 patellar reflexes bilaterally. Normal gait. Negative Romberg's. No pronator's drift.    Skin: Skin is warm and dry.  Psychiatric: She has a normal mood and affect. Her behavior is normal.    ED Course  Procedures (including critical care time)  Medications  sodium chloride 0.9 % bolus 1,000 mL (1,000 mLs Intravenous New Bag/Given 09/20/12 1647)  sodium chloride 0.9 % bolus 1,000 mL (not administered)  sodium chloride 0.9 % bolus 1,000 mL (0 mLs Intravenous Stopped 09/20/12 1656)  iohexol (OMNIPAQUE) 350 MG/ML injection 100 mL (100 mLs Intravenous Contrast Given 09/20/12 1549)    DIAGNOSTIC STUDIES: Oxygen Saturation is 98% on RA, normal by my interpretation.    COORDINATION OF CARE: 3:17 PM-When pt stood up from a resting position, HR increased to 102. BP was 89/66. Discussed treatment plan which includes CXR, CBC panel, BMP, ASA and troponin with pt at bedside and pt agreed to plan.   4:13 PM-Consult complete with Dr. Katrinka Blazing, pt's PCP. Patient case explained and discussed. Dr. Katrinka Blazing agrees to the symptoms are most likely orthostasis. Advised to have pt call for an appointment tomorrow. Call ended at  4:16 PM.  Labs Reviewed  CBC - Abnormal; Notable for the following:    RBC 3.79 (*)    Hemoglobin 11.1 (*)    HCT 34.2 (*)    All other components within normal limits  BASIC METABOLIC PANEL - Abnormal; Notable for the following:    Glucose, Bld 126 (*)    GFR calc non Af Amer 50 (*)    GFR calc Af Amer 58 (*)    All other components within normal limits  D-DIMER, QUANTITATIVE - Abnormal; Notable for the following:    D-Dimer, Quant 2.49 (*)  All other components within normal limits  TROPONIN I  URINALYSIS, ROUTINE W REFLEX MICROSCOPIC  PRO B NATRIURETIC PEPTIDE  TROPONIN I   Dg Chest 2 View  09/20/2012  *RADIOLOGY REPORT*  Clinical Data: Near syncopal episodes.  Dizziness and diaphoresis.  CHEST - 2 VIEW  Comparison: Two-view chest 05/27/2012  Findings: The heart size is normal.  The lungs are clear.  Mild hyperexpansion is stable.  There is some interstitial coarsening which appears chronic.  The visualized soft tissues and bony thorax are unremarkable.  Atherosclerotic changes are noted.  IMPRESSION:  1.  No acute cardiopulmonary disease or significant interval change. 2.  Stable mild emphysematous disease.   Original Report Authenticated By: Marin Roberts, M.D.    Ct Angio Chest Pe W/cm &/or Wo Cm  09/20/2012  *RADIOLOGY REPORT*  Clinical Data: Chest pain and shortness of breath  CT ANGIOGRAPHY CHEST  Technique:  Multidetector CT imaging of the chest using the standard protocol during bolus administration of intravenous contrast. Multiplanar reconstructed images including MIPs were obtained and reviewed to evaluate the vascular anatomy.  Contrast: OMNIPAQUE IOHEXOL 350 MG/ML SOLN  Comparison: None.  Findings: The lungs are well-aerated bilaterally and demonstrate some emphysematous changes.  No focal infiltrate or sizable effusion is seen.  The thoracic aorta and origins of the great vessels are within normal limits.  No dissection is seen.  The pulmonary artery  demonstrates a normal branching pattern and shows no evidence of pulmonary emboli. Some lymph nodes are noted in the aorticopulmonary window but appeared have normal fatty hila.  The largest of these measures 13 mm in short axis best seen on image number 104 of series 5.  No significant hilar adenopathy is noted. Heavy coronary calcifications are seen  The visualized upper abdomen is within normal limits.  IMPRESSION: No evidence of pulmonary emboli.  Emphysematous changes.   Original Report Authenticated By: Alcide Clever, M.D.      No diagnosis found.    MDM  2 near syncopal episodes the last 3 days with lightheadedness, dizziness diaphoresis and vision darkening. No chest pain or shortness of breath. History of CAD with stents, known carotid occlusions, hypertension and hyperlipidemia.  Labs reviewed unremarkable. Positive orthostatics.  EKG normal sinus rhythm and unchanged. Troponin negative. Initial hypoxia appears to be spurious. CT negative for pulmonary embolism.  Case discussed with patient's PCP Dr. Jari Favre. He agrees she is having orthostasis. Recommends holding her blood pressure medications until tomorrow. She is instructed to call first thing in the morning for an appointment.  After fluid hydration in the ED her blood pressure has much improved to the 130s. Ambulatory in the ED without assistance and tolerating PO.   Date: 09/20/2012  Rate: 82  Rhythm: normal sinus rhythm  QRS Axis: normal  Intervals: normal  ST/T Wave abnormalities: normal  Conduction Disutrbances:none  Narrative Interpretation:   Old EKG Reviewed: unchanged   Date: 09/20/2012  Rate: 70  Rhythm: normal sinus rhythm  QRS Axis: normal  Intervals: normal  ST/T Wave abnormalities: normal  Conduction Disutrbances:none  Narrative Interpretation:   Old EKG Reviewed: unchanged     I personally performed the services described in this documentation, which was scribed in my presence. The recorded  information has been reviewed and is accurate.    Glynn Octave, MD 09/21/12 [phone]

## 2012-09-20 NOTE — ED Notes (Signed)
Attempt blood draw for repeat troponin x 2 RN-M Lottie Rater, RN at [facility]

## 2013-03-21 ENCOUNTER — Emergency Department (HOSPITAL_BASED_OUTPATIENT_CLINIC_OR_DEPARTMENT_OTHER)
Admission: EM | Admit: 2013-03-21 | Discharge: 2013-03-21 | Disposition: A | Discharge status: Home / Self Care | Payer: Medicare Other | Attending: Emergency Medicine | Admitting: Emergency Medicine

## 2013-03-21 ENCOUNTER — Encounter (HOSPITAL_BASED_OUTPATIENT_CLINIC_OR_DEPARTMENT_OTHER): Payer: Self-pay | Admitting: Emergency Medicine

## 2013-03-21 DIAGNOSIS — E7800 Pure hypercholesterolemia, unspecified: Secondary | ICD-10-CM | POA: Insufficient documentation

## 2013-03-21 DIAGNOSIS — Z9861 Coronary angioplasty status: Secondary | ICD-10-CM | POA: Insufficient documentation

## 2013-03-21 DIAGNOSIS — Y939 Activity, unspecified: Secondary | ICD-10-CM | POA: Insufficient documentation

## 2013-03-21 DIAGNOSIS — X12XXXA Contact with other hot fluids, initial encounter: Secondary | ICD-10-CM | POA: Insufficient documentation

## 2013-03-21 DIAGNOSIS — T23209A Burn of second degree of unspecified hand, unspecified site, initial encounter: Secondary | ICD-10-CM | POA: Insufficient documentation

## 2013-03-21 DIAGNOSIS — I10 Essential (primary) hypertension: Secondary | ICD-10-CM | POA: Insufficient documentation

## 2013-03-21 DIAGNOSIS — Z79899 Other long term (current) drug therapy: Secondary | ICD-10-CM | POA: Insufficient documentation

## 2013-03-21 DIAGNOSIS — T23202A Burn of second degree of left hand, unspecified site, initial encounter: Secondary | ICD-10-CM

## 2013-03-21 DIAGNOSIS — R55 Syncope and collapse: Secondary | ICD-10-CM | POA: Insufficient documentation

## 2013-03-21 DIAGNOSIS — Y929 Unspecified place or not applicable: Secondary | ICD-10-CM | POA: Insufficient documentation

## 2013-03-21 DIAGNOSIS — R42 Dizziness and giddiness: Secondary | ICD-10-CM | POA: Insufficient documentation

## 2013-03-21 LAB — URINALYSIS, ROUTINE W REFLEX MICROSCOPIC
Bilirubin Urine: NEGATIVE
Hgb urine dipstick: NEGATIVE
Nitrite: NEGATIVE
Specific Gravity, Urine: 1.011 (ref 1.005–1.030)
Urobilinogen, UA: 0.2 mg/dL (ref 0.0–1.0)
pH: 6 (ref 5.0–8.0)

## 2013-03-21 LAB — CBC WITH DIFFERENTIAL/PLATELET
Eosinophils Relative: 3 % (ref 0–5)
Hemoglobin: 11.1 g/dL — ABNORMAL LOW (ref 12.0–15.0)
Lymphocytes Relative: 23 % (ref 12–46)
Lymphs Abs: 1.1 10*3/uL (ref 0.7–4.0)
MCV: 91 fL (ref 78.0–100.0)
Monocytes Absolute: 0.8 10*3/uL (ref 0.1–1.0)
Monocytes Relative: 17 % — ABNORMAL HIGH (ref 3–12)
Neutro Abs: 2.7 10*3/uL (ref 1.7–7.7)
Neutrophils Relative %: 57 % (ref 43–77)
RBC: 3.79 MIL/uL — ABNORMAL LOW (ref 3.87–5.11)
RDW: 15.1 % (ref 11.5–15.5)

## 2013-03-21 LAB — BASIC METABOLIC PANEL
BUN: 16 mg/dL (ref 6–23)
Chloride: 107 mEq/L (ref 96–112)
Creatinine, Ser: 1.1 mg/dL (ref 0.50–1.10)
GFR calc Af Amer: 58 mL/min — ABNORMAL LOW (ref >90)
Glucose, Bld: 90 mg/dL (ref 70–99)
Potassium: 3.6 mEq/L (ref 3.5–5.1)

## 2013-03-21 MED ORDER — TRAMADOL HCL 50 MG PO TABS: 50.0000 mg | ORAL_TABLET | Freq: Four times a day (QID) | ORAL | Status: DC | PRN

## 2013-03-21 MED ORDER — SODIUM CHLORIDE 0.9 % IV BOLUS (SEPSIS)
1000.0000 mL | Freq: Once | INTRAVENOUS | Status: AC
  Administered 2013-03-21: 1000 mL via INTRAVENOUS

## 2013-03-21 NOTE — ED Notes (Signed)
Pt to ED via EMS with c/o near syncope at home. Family was helping her to bathroom when she became "lethargic;" per family pt never lost consciousness. Pt A & O x 4 upon arrival.
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
## 2013-03-21 NOTE — ED Notes (Signed)
Pt drinking water in order to provide urine sample
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
## 2013-06-28 ENCOUNTER — Other Ambulatory Visit: Payer: Self-pay | Admitting: Obstetrics and Gynecology

## 2013-06-28 DIAGNOSIS — Z1231 Encounter for screening mammogram for malignant neoplasm of breast: Secondary | ICD-10-CM

## 2013-07-07 ENCOUNTER — Encounter (HOSPITAL_BASED_OUTPATIENT_CLINIC_OR_DEPARTMENT_OTHER): Payer: Self-pay | Admitting: Emergency Medicine

## 2013-07-07 ENCOUNTER — Emergency Department (HOSPITAL_BASED_OUTPATIENT_CLINIC_OR_DEPARTMENT_OTHER)
Admission: EM | Admit: 2013-07-07 | Discharge: 2013-07-07 | Disposition: A | Discharge status: Home / Self Care | Payer: Medicare Other | Attending: Emergency Medicine | Admitting: Emergency Medicine

## 2013-07-07 DIAGNOSIS — E7800 Pure hypercholesterolemia, unspecified: Secondary | ICD-10-CM | POA: Insufficient documentation

## 2013-07-07 DIAGNOSIS — Z7982 Long term (current) use of aspirin: Secondary | ICD-10-CM | POA: Insufficient documentation

## 2013-07-07 DIAGNOSIS — Z79899 Other long term (current) drug therapy: Secondary | ICD-10-CM | POA: Insufficient documentation

## 2013-07-07 DIAGNOSIS — Z7902 Long term (current) use of antithrombotics/antiplatelets: Secondary | ICD-10-CM | POA: Insufficient documentation

## 2013-07-07 DIAGNOSIS — Z9861 Coronary angioplasty status: Secondary | ICD-10-CM | POA: Insufficient documentation

## 2013-07-07 DIAGNOSIS — I10 Essential (primary) hypertension: Secondary | ICD-10-CM | POA: Insufficient documentation

## 2013-07-07 LAB — BASIC METABOLIC PANEL
BUN: 15 mg/dL (ref 6–23)
CO2: 23 meq/L (ref 19–32)
CREATININE: 0.9 mg/dL (ref 0.50–1.10)
Calcium: 9.2 mg/dL (ref 8.4–10.5)
Chloride: 106 mEq/L (ref 96–112)
GFR calc Af Amer: 74 mL/min — ABNORMAL LOW (ref >90)
GFR calc non Af Amer: 64 mL/min — ABNORMAL LOW (ref >90)
GLUCOSE: 126 mg/dL — AB (ref 70–99)
Potassium: 4 mEq/L (ref 3.7–5.3)
SODIUM: 142 meq/L (ref 137–147)

## 2013-07-07 MED ORDER — METOPROLOL TARTRATE 50 MG PO TABS
25.0000 mg | ORAL_TABLET | Freq: Once | ORAL | Status: AC
  Administered 2013-07-07: 25 mg via ORAL
  Filled 2013-07-07: qty 1

## 2013-07-07 NOTE — ED Provider Notes (Signed)
CSN: [id number]     Arrival date & time 07/07/13  2117 History  This chart was scribed for Shanna CiscoMegan E Riordan Walle, MD by Nicholos Johnsenise Iheanachor, ED scribe. This patient was seen in room MH10/MH10 and the patient's care was started at 10:23 PM.  Chief Complaint  Patient presents with  . Hypertension   Patient is a 68 y.o. female presenting with hypertension. The history is provided by the patient. No language interpreter was used.  Hypertension This is a chronic problem. The current episode started 2 days ago. The problem has not changed since onset.Pertinent negatives include no chest pain, no abdominal pain, no headaches and no shortness of breath. The symptoms are relieved by medications.  HPI Comments: Sara Villanueva is a 68 y.o. female who presents to the Emergency Department complaining of high BP for the past couple of days. Pt has been taking Fosinopril and Metoprolol for the last couple of years and has been consistent in taking them. Denies any changes in regular routine. No changes in diet; eating or drink normal. States she is not having trouble sleeping. Pt denies any bowel/bladder incontinence, HA, blurry vision, numbness, tingling, leg swelling, trouble walking.  PCP: Dr. Theodoro Gristho   Past Medical History  Diagnosis Date  . Carotid arterial disease   . Hypertension   . High cholesterol    Past Surgical History  Procedure Laterality Date  . Coronary angioplasty with stent placement     No family history on file. History  Substance Use Topics  . Smoking status: Never Smoker   . Smokeless tobacco: Never Used  . Alcohol Use: No   OB History   Grav Para Term Preterm Abortions TAB SAB Ect Mult Living                 Review of Systems  Constitutional: Negative for fever, chills, diaphoresis, activity change, appetite change and fatigue.  HENT: Negative for congestion, facial swelling, rhinorrhea and sore throat.   Eyes: Negative for photophobia and discharge.  Respiratory: Negative for  cough, chest tightness and shortness of breath.   Cardiovascular: Negative for chest pain, palpitations and leg swelling.  Gastrointestinal: Negative for nausea, vomiting, abdominal pain and diarrhea.  Endocrine: Negative for polydipsia and polyuria.  Genitourinary: Negative for dysuria, frequency, difficulty urinating and pelvic pain.  Musculoskeletal: Negative for arthralgias, back pain, neck pain and neck stiffness.  Skin: Negative for color change and wound.  Allergic/Immunologic: Negative for immunocompromised state.  Neurological: Negative for facial asymmetry, weakness, numbness and headaches.  Hematological: Does not bruise/bleed easily.  Psychiatric/Behavioral: Negative for confusion and agitation.    Allergies  Review of patient's allergies indicates no known allergies.  Home Medications   Current Outpatient Rx  Name  Route  Sig  Dispense  Refill  . aspirin 81 MG tablet   Oral   Take 81 mg by mouth daily.         . FOSINOPRIL SODIUM PO   Oral   Take by mouth daily.         Marland Kitchen. amoxicillin-clavulanate (AUGMENTIN) 875-125 MG per tablet   Oral   Take 1 tablet by mouth every 12 (twelve) hours.   14 tablet   0   . atorvastatin (LIPITOR) 80 MG tablet   Oral   Take 80 mg by mouth daily.         . celecoxib (CELEBREX) 50 MG capsule   Oral   Take 50 mg by mouth daily.         .Marland Kitchen  Cetirizine HCl (ZYRTEC PO)   Oral   Take 1 tablet by mouth daily as needed. Patient is using this medication for her allergies.         Marland Kitchen clopidogrel (PLAVIX) 75 MG tablet   Oral   Take 75 mg by mouth daily.         Marland Kitchen ezetimibe (ZETIA) 10 MG tablet   Oral   Take 10 mg by mouth daily.         . isosorbide mononitrate (IMDUR) 30 MG 24 hr tablet   Oral   Take 30 mg by mouth daily.         . metoprolol succinate (TOPROL-XL) 50 MG 24 hr tablet   Oral   Take 50 mg by mouth daily. Take with or immediately following a meal.         . METOPROLOL SUCCINATE ER PO   Oral    Take 30 mg by mouth daily.         . traMADol (ULTRAM) 50 MG tablet   Oral   Take 1 tablet (50 mg total) by mouth every 6 (six) hours as needed for pain.   15 tablet   0    Triage Vitals: BP 177/67  Pulse 73  Temp(Src) 98 F (36.7 C) (Oral)  Resp 16  Ht 5\' 8"  (1.727 m)  Wt 170 lb (77.111 kg)  BMI 25.85 kg/m2  SpO2 100% Physical Exam  Nursing note and vitals reviewed. Constitutional: She is oriented to person, place, and time. She appears well-developed and well-nourished. No distress.  HENT:  Head: Normocephalic.  Mouth/Throat: Oropharynx is clear and moist.  Eyes: Pupils are equal, round, and reactive to light.  Neck: Neck supple.  Cardiovascular: Normal rate, regular rhythm and normal heart sounds.   Pulmonary/Chest: Effort normal and breath sounds normal. No respiratory distress. She has no wheezes.  Abdominal: Soft. She exhibits no distension. There is no tenderness. There is no rebound and no guarding.  Musculoskeletal: She exhibits no edema and no tenderness.  Neurological: She is alert and oriented to person, place, and time. No cranial nerve deficit or sensory deficit. She exhibits normal muscle tone. Coordination normal. GCS eye subscore is 4. GCS verbal subscore is 5. GCS motor subscore is 6.  Skin: Skin is warm and dry.  Psychiatric: She has a normal mood and affect.   ED Course  Procedures (including critical care time) DIAGNOSTIC STUDIES: Oxygen Saturation is 100% on room air, normal by my interpretation.    COORDINATION OF CARE: At 10:27 PM: Discussed treatment plan with patient which includes EKG and blood work. Patient agrees.   Labs Review Labs Reviewed  BASIC METABOLIC PANEL - Abnormal; Notable for the following:    Glucose, Bld 126 (*)    GFR calc non Af Amer 64 (*)    GFR calc Af Amer 74 (*)    All other components within normal limits   Imaging Review No results found.  EKG Interpretation    Date/Time:  Friday July 07 2013 22:32:24  EST Ventricular Rate:  65 PR Interval:  166 QRS Duration: 84 QT Interval:  420 QTC Calculation: 436 R Axis:   64 Text Interpretation:  Normal sinus rhythm with sinus arrhythmia Possible Left atrial enlargement unchanged from prior Confirmed by Halsey Hammen  MD, Moiz Ryant ([id number]) on 07/07/2013 10:49:39 PM            MDM   1. Hypertension    Pt is a 68 y.o. female with Pmhx as  above who presents with asymptomatic HTN since yesterday, as high as 200 systolic.  Denies h/a, blurry vision, numbness, weakness, CP, SOB, ab pain, urinary symptoms. BP here 177/67.  Cardiopulm exam benign. EKG w/o signs of acute ischemia.  Cr nml.  Doubt end organ damage due to HTN.  Extra dose of PO metoprolol given.  Pt can f/u with PCP on Monday, continue to trend BP.  Return precautions given for new or worsening symptoms including CP, SOB, focal neuro complaints.    I personally performed the services described in this documentation, which was scribed in my presence. The recorded information has been reviewed and is accurate.   Shanna Cisco, MD 07/08/13 [phone]

## 2013-07-07 NOTE — Discharge Instructions (Signed)
Arterial Hypertension °Arterial hypertension (high blood pressure) is a condition of elevated pressure in your blood vessels. Hypertension over a long period of time is a risk factor for strokes, heart attacks, and heart failure. It is also the leading cause of kidney (renal) failure.  °CAUSES  °· In Adults -- Over 90% of all hypertension has no known cause. This is called essential or primary hypertension. In the other 10% of people with hypertension, the increase in blood pressure is caused by another disorder. This is called secondary hypertension. Important causes of secondary hypertension are: °· Heavy alcohol use. °· Obstructive sleep apnea. °· Hyperaldosterosim (Conn's syndrome). °· Steroid use. °· Chronic kidney failure. °· Hyperparathyroidism. °· Medications. °· Renal artery stenosis. °· Pheochromocytoma. °· Cushing's disease. °· Coarctation of the aorta. °· Scleroderma renal crisis. °· Licorice (in excessive amounts). °· Drugs (cocaine, methamphetamine). °Your caregiver can explain any items above that apply to you. °· In Children -- Secondary hypertension is more common and should always be considered. °· Pregnancy -- Few women of childbearing age have high blood pressure. However, up to 10% of them develop hypertension of pregnancy. Generally, this will not harm the woman. It may be a sign of 3 complications of pregnancy: preeclampsia, HELLP syndrome, and eclampsia. Follow up and control with medication is necessary. °SYMPTOMS  °· This condition normally does not produce any noticeable symptoms. It is usually found during a routine exam. °· Malignant hypertension is a late problem of high blood pressure. It may have the following symptoms: °· Headaches. °· Blurred vision. °· End-organ damage (this means your kidneys, heart, lungs, and other organs are being damaged). °· Stressful situations can increase the blood pressure. If a person with normal blood pressure has their blood pressure go up while being  seen by their caregiver, this is often termed "white coat hypertension." Its importance is not known. It may be related with eventually developing hypertension or complications of hypertension. °· Hypertension is often confused with mental tension, stress, and anxiety. °DIAGNOSIS  °The diagnosis is made by 3 separate blood pressure measurements. They are taken at least 1 week apart from each other. If there is organ damage from hypertension, the diagnosis may be made without repeat measurements. °Hypertension is usually identified by having blood pressure readings: °· Above 140/90 mmHg measured in both arms, at 3 separate times, over a couple weeks. °· Over 130/80 mmHg should be considered a risk factor and may require treatment in patients with diabetes. °Blood pressure readings over 120/80 mmHg are called "pre-hypertension" even in non-diabetic patients. °To get a true blood pressure measurement, use the following guidelines. Be aware of the factors that can alter blood pressure readings. °· Take measurements at least 1 hour after caffeine. °· Take measurements 30 minutes after smoking and without any stress. This is another reason to quit smoking  it raises your blood pressure. °· Use a proper cuff size. Ask your caregiver if you are not sure about your cuff size. °· Most home blood pressure cuffs are automatic. They will measure systolic and diastolic pressures. The systolic pressure is the pressure reading at the start of sounds. Diastolic pressure is the pressure at which the sounds disappear. If you are elderly, measure pressures in multiple postures. Try sitting, lying or standing. °· Sit at rest for a minimum of 5 minutes before taking measurements. °· You should not be on any medications like decongestants. These are found in many cold medications. °· Record your blood pressure readings and review   them with your caregiver. °If you have hypertension: °· Your caregiver may do tests to be sure you do not have  secondary hypertension (see "causes" above). °· Your caregiver may also look for signs of metabolic syndrome. This is also called Syndrome X or Insulin Resistance Syndrome. You may have this syndrome if you have type 2 diabetes, abdominal obesity, and abnormal blood lipids in addition to hypertension. °· Your caregiver will take your medical and family history and perform a physical exam. °· Diagnostic tests may include blood tests (for glucose, cholesterol, potassium, and kidney function), a urinalysis, or an EKG. Other tests may also be necessary depending on your condition. °PREVENTION  °There are important lifestyle issues that you can adopt to reduce your chance of developing hypertension: °· Maintain a normal weight. °· Limit the amount of salt (sodium) in your diet. °· Exercise often. °· Limit alcohol intake. °· Get enough potassium in your diet. Discuss specific advice with your caregiver. °· Follow a DASH diet (dietary approaches to stop hypertension). This diet is rich in fruits, vegetables, and low-fat dairy products, and avoids certain fats. °PROGNOSIS  °Essential hypertension cannot be cured. Lifestyle changes and medical treatment can lower blood pressure and reduce complications. The prognosis of secondary hypertension depends on the underlying cause. Many people whose hypertension is controlled with medicine or lifestyle changes can live a normal, healthy life.  °RISKS AND COMPLICATIONS  °While high blood pressure alone is not an illness, it often requires treatment due to its short- and long-term effects on many organs. Hypertension increases your risk for: °· CVAs or strokes (cerebrovascular accident). °· Heart failure due to chronically high blood pressure (hypertensive cardiomyopathy). °· Heart attack (myocardial infarction). °· Damage to the retina (hypertensive retinopathy). °· Kidney failure (hypertensive nephropathy). °Your caregiver can explain list items above that apply to you. Treatment  of hypertension can significantly reduce the risk of complications. °TREATMENT  °· For overweight patients, weight loss and regular exercise are recommended. Physical fitness lowers blood pressure. °· Mild hypertension is usually treated with diet and exercise. A diet rich in fruits and vegetables, fat-free dairy products, and foods low in fat and salt (sodium) can help lower blood pressure. Decreasing salt intake decreases blood pressure in a 1/3 of people. °· Stop smoking if you are a smoker. °The steps above are highly effective in reducing blood pressure. While these actions are easy to suggest, they are difficult to achieve. Most patients with moderate or severe hypertension end up requiring medications to bring their blood pressure down to a normal level. There are several classes of medications for treatment. Blood pressure pills (antihypertensives) will lower blood pressure by their different actions. Lowering the blood pressure by 10 mmHg may decrease the risk of complications by as much as 25%. °The goal of treatment is effective blood pressure control. This will reduce your risk for complications. Your caregiver will help you determine the best treatment for you according to your lifestyle. What is excellent treatment for one person, may not be for you. °HOME CARE INSTRUCTIONS  °· Do not smoke. °· Follow the lifestyle changes outlined in the "Prevention" section. °· If you are on medications, follow the directions carefully. Blood pressure medications must be taken as prescribed. Skipping doses reduces their benefit. It also puts you at risk for problems. °· Follow up with your caregiver, as directed. °· If you are asked to monitor your blood pressure at home, follow the guidelines in the "Diagnosis" section above. °SEEK MEDICAL CARE   IF:  °· You think you are having medication side effects. °· You have recurrent headaches or lightheadedness. °· You have swelling in your ankles. °· You have trouble with  your vision. °SEEK IMMEDIATE MEDICAL CARE IF:  °· You have sudden onset of chest pain or pressure, difficulty breathing, or other symptoms of a heart attack. °· You have a severe headache. °· You have symptoms of a stroke (such as sudden weakness, difficulty speaking, difficulty walking). °MAKE SURE YOU:  °· Understand these instructions. °· Will watch your condition. °· Will get help right away if you are not doing well or get worse. °Document Released: 05/18/2005 Document Revised: 08/10/2011 Document Reviewed: 12/16/2006 °[employer]® Patient Information ©2014 [employer]. ° °

## 2013-07-07 NOTE — ED Notes (Signed)
Systolic bp 200 yesterday at MD visit. Was told by pmd to monitor her bp for a few days and if it stayed high to come see him Monday.  Pt checked her bp at home and systolic was 200 so she decided to come "see about it."

## 2013-07-09 ENCOUNTER — Emergency Department (HOSPITAL_COMMUNITY)
Admission: EM | Admit: 2013-07-09 | Discharge: 2013-07-09 | Disposition: A | Discharge status: Home / Self Care | Payer: Medicare Other | Attending: Emergency Medicine | Admitting: Emergency Medicine

## 2013-07-09 ENCOUNTER — Encounter (HOSPITAL_COMMUNITY): Payer: Self-pay | Admitting: Emergency Medicine

## 2013-07-09 DIAGNOSIS — Z9861 Coronary angioplasty status: Secondary | ICD-10-CM | POA: Insufficient documentation

## 2013-07-09 DIAGNOSIS — Z7902 Long term (current) use of antithrombotics/antiplatelets: Secondary | ICD-10-CM | POA: Insufficient documentation

## 2013-07-09 DIAGNOSIS — Z7982 Long term (current) use of aspirin: Secondary | ICD-10-CM | POA: Insufficient documentation

## 2013-07-09 DIAGNOSIS — Z79899 Other long term (current) drug therapy: Secondary | ICD-10-CM | POA: Insufficient documentation

## 2013-07-09 DIAGNOSIS — I959 Hypotension, unspecified: Secondary | ICD-10-CM | POA: Insufficient documentation

## 2013-07-09 DIAGNOSIS — I10 Essential (primary) hypertension: Secondary | ICD-10-CM | POA: Insufficient documentation

## 2013-07-09 DIAGNOSIS — E7800 Pure hypercholesterolemia, unspecified: Secondary | ICD-10-CM | POA: Insufficient documentation

## 2013-07-09 DIAGNOSIS — R42 Dizziness and giddiness: Secondary | ICD-10-CM | POA: Insufficient documentation

## 2013-07-09 LAB — BASIC METABOLIC PANEL
BUN: 18 mg/dL (ref 6–23)
CALCIUM: 8.9 mg/dL (ref 8.4–10.5)
CO2: 19 mEq/L (ref 19–32)
Chloride: 105 mEq/L (ref 96–112)
Creatinine, Ser: 0.93 mg/dL (ref 0.50–1.10)
GFR, EST AFRICAN AMERICAN: 72 mL/min — AB (ref >90)
GFR, EST NON AFRICAN AMERICAN: 62 mL/min — AB (ref >90)
Glucose, Bld: 115 mg/dL — ABNORMAL HIGH (ref 70–99)
POTASSIUM: 4 meq/L (ref 3.7–5.3)
SODIUM: 138 meq/L (ref 137–147)

## 2013-07-09 LAB — CBC WITH DIFFERENTIAL/PLATELET
BASOS ABS: 0 10*3/uL (ref 0.0–0.1)
BASOS PCT: 1 % (ref 0–1)
EOS PCT: 5 % (ref 0–5)
Eosinophils Absolute: 0.3 10*3/uL (ref 0.0–0.7)
HCT: 34.9 % — ABNORMAL LOW (ref 36.0–46.0)
Hemoglobin: 11.5 g/dL — ABNORMAL LOW (ref 12.0–15.0)
Lymphocytes Relative: 24 % (ref 12–46)
Lymphs Abs: 1.3 10*3/uL (ref 0.7–4.0)
MCH: 29.8 pg (ref 26.0–34.0)
MCHC: 33 g/dL (ref 30.0–36.0)
MCV: 90.4 fL (ref 78.0–100.0)
Monocytes Absolute: 0.4 10*3/uL (ref 0.1–1.0)
Monocytes Relative: 6 % (ref 3–12)
NEUTROS PCT: 64 % (ref 43–77)
Neutro Abs: 3.6 10*3/uL (ref 1.7–7.7)
Platelets: 191 10*3/uL (ref 150–400)
RBC: 3.86 MIL/uL — ABNORMAL LOW (ref 3.87–5.11)
RDW: 15.3 % (ref 11.5–15.5)
WBC: 5.6 10*3/uL (ref 4.0–10.5)

## 2013-07-09 NOTE — ED Provider Notes (Signed)
CSN: [id number]     Arrival date & time 07/09/13  1247 History   First MD Initiated Contact with Patient 07/09/13 1249     Chief Complaint  Patient presents with  . Hypotension  . Dizziness    HPI The patient presents emergent complaints of low blood pressure. Patient has a history of hypertension. She was recently seen and [facility] yesterday for evaluation of her high blood pressure. The patient has been rather asymptomatic. She has not any trouble with chest pain, headache, shortness of breath, vomiting or diarrhea. She has not noticed any blood in her stool. The patient was given an extra dose of metoprolol yesterday in the emergency room after an unremarkable evaluation. She's told to follow up with her primary Dr. And monitor her blood pressure closely.  The patient was feeling fine today when she checked her blood pressure and noted it to be low with a systolic in the 90s. The patient called EMS to be brought to the hospital for evaluation. Patient states EMS gave her approximately 200 cc of IV fluids and she is feeling better now. Patient denies any symptoms at this point  Past Medical History  Diagnosis Date  . Carotid arterial disease   . Hypertension   . High cholesterol    Past Surgical History  Procedure Laterality Date  . Coronary angioplasty with stent placement     History reviewed. No pertinent family history. History  Substance Use Topics  . Smoking status: Never Smoker   . Smokeless tobacco: Never Used  . Alcohol Use: No   OB History   Grav Para Term Preterm Abortions TAB SAB Ect Mult Living                 Review of Systems  Respiratory: Negative for cough and shortness of breath.   Cardiovascular: Negative for chest pain.  All other systems reviewed and are negative.    Allergies  Review of patient's allergies indicates no known allergies.  Home Medications   Current Outpatient Rx  Name  Route  Sig  Dispense  Refill  . aspirin 81 MG  tablet   Oral   Take 81 mg by mouth at bedtime.          Marland Kitchen atorvastatin (LIPITOR) 80 MG tablet   Oral   Take 80 mg by mouth daily at 6 PM.          . cetirizine (ZYRTEC) 10 MG tablet   Oral   Take 10 mg by mouth at bedtime.         . clopidogrel (PLAVIX) 75 MG tablet   Oral   Take 75 mg by mouth daily.         Marland Kitchen ezetimibe (ZETIA) 10 MG tablet   Oral   Take 10 mg by mouth at bedtime.          . fosinopril (MONOPRIL) 20 MG tablet   Oral   Take 20 mg by mouth daily.         . isosorbide mononitrate (IMDUR) 30 MG 24 hr tablet   Oral   Take 30 mg by mouth daily.         . metoprolol succinate (TOPROL-XL) 50 MG 24 hr tablet   Oral   Take 50 mg by mouth daily. Take with or immediately following a meal.          BP 110/55  Pulse 63  Temp(Src) 97 F (36.1 C) (Oral)  Resp 16  SpO2 100% Physical Exam  Nursing note and vitals reviewed. Constitutional: She appears well-developed and well-nourished. No distress.  HENT:  Head: Normocephalic and atraumatic.  Right Ear: External ear normal.  Left Ear: External ear normal.  Eyes: Conjunctivae are normal. Right eye exhibits no discharge. Left eye exhibits no discharge. No scleral icterus.  Neck: Neck supple. No tracheal deviation present.  Cardiovascular: Normal rate, regular rhythm and intact distal pulses.   Pulmonary/Chest: Effort normal and breath sounds normal. No stridor. No respiratory distress. She has no wheezes. She has no rales.  Abdominal: Soft. Bowel sounds are normal. She exhibits no distension. There is no tenderness. There is no rebound and no guarding.  Musculoskeletal: She exhibits no edema and no tenderness.  Neurological: She is alert. She has normal strength. No cranial nerve deficit (no facial droop, extraocular movements intact, no slurred speech) or sensory deficit. She exhibits normal muscle tone. She displays no seizure activity. Coordination normal.  Skin: Skin is warm and dry. No rash  noted.  Psychiatric: She has a normal mood and affect.    ED Course  Procedures (including critical care time) Labs Review Labs Reviewed  CBC WITH DIFFERENTIAL - Abnormal; Notable for the following:    RBC 3.86 (*)    Hemoglobin 11.5 (*)    HCT 34.9 (*)    All other components within normal limits  BASIC METABOLIC PANEL - Abnormal; Notable for the following:    Glucose, Bld 115 (*)    GFR calc non Af Amer 62 (*)    GFR calc Af Amer 72 (*)    All other components within normal limits   Imaging Review No results found.  EKG Interpretation   None       MDM   1. Hypotension     Patient's vitals here are normal. She is not hypotensive. Patient was slightly orthostatic with her blood pressure decreased but she was not symptomatic. The patient has had trouble with hypertension in fact yesterday was in the emergency department for elevated blood pressure. r. The patient mentioned to me that she's had this trouble in the past of having labile blood pressures. She is otherwise stable. Her electrolytes and CBC are normal.  At this time there does not appear to be any evidence of an acute emergency medical condition and the patient appears stable for discharge with appropriate outpatient follow up.    Celene Kras, MD 07/09/13 1537

## 2013-07-09 NOTE — ED Notes (Signed)
Patient complained of dizziness and hypotension. Patient reports blood pressure being "in the 200s yesterday."  Patient is on a blood pressure medication that has been the same for two years. Patient reports past history of episodes where blood pressure will "get really high one day and then it will drop really low the next day." Patients states her PCP normally changes the does when this happens. Patient denies nausea, dizziness, seeing spots, headache, or any shortness of breath. Patient is comfortable laying in the bed.

## 2013-07-09 NOTE — ED Notes (Addendum)
Pt to department via EMS- pt reports that she was seen at [facility] for HTN on Friday and had a work up. Pt reports that she was told to monitor her BP and today noticed that she was hypotensive. Pt was orthostatic with PTAR. Pt has had a liter of NS. Bp-90 Hr-54 CBG-85 20g RAC.

## 2013-07-09 NOTE — Discharge Instructions (Signed)
Hypotension As your heart beats, it forces blood through your arteries. This force is your blood pressure. If your blood pressure is too low for you to go about your normal activities or to support the organs of your body, you have hypotension. Hypotension is also referred to as low blood pressure. When your blood pressure becomes too low, you may not get enough blood to your brain. As a result, you may feel weak, feel lightheaded, or develop a rapid heart rate. In a more severe case, you may faint. CAUSES Various conditions can cause hypotension. These include:  Blood loss.  Dehydration.  Heart or endocrine problems.  Pregnancy.  Severe infection.  Not having a well-balanced diet filled with needed nutrients.  Severe allergic reactions (anaphylaxis). Some medicines, such as blood pressure medicine or water pills (diuretics), may lower your blood pressure below normal. Sometimes taking too much medicine or taking medicine not as directed can cause hypotension. TREATMENT  Hospitalization is sometimes required for hypotension if fluid or blood replacement is needed, if time is needed for medicines to wear off, or if further monitoring is needed. Treatment might include changing your diet, changing your medicines (including medicines aimed at raising your blood pressure), and use of support stockings. HOME CARE INSTRUCTIONS   Drink enough fluids to keep your urine clear or pale yellow.  Take your medicines as directed by your health care provider.  Get up slowly from reclining or sitting positions. This gives your blood pressure a chance to adjust.  Wear support stockings as directed by your health care provider.  Maintain a healthy diet by including nutritious food, such as fruits, vegetables, nuts, whole grains, and lean meats. SEEK MEDICAL CARE IF:  You have vomiting or diarrhea.  You have a fever for more than 2 3 days.  You feel more thirsty than usual.  You feel weak and  tired. SEEK IMMEDIATE MEDICAL CARE IF:   You have chest pain or a fast or irregular heartbeat.  You have a loss of feeling in some part of your body, or you lose movement in your arms or legs.  You have trouble speaking.  You become sweaty or feel lightheaded.  You faint. MAKE SURE YOU:   Understand these instructions.  Will watch your condition.  Will get help right away if you are not doing well or get worse. Document Released: 05/18/2005 Document Revised: 03/08/2013 Document Reviewed: 11/18/2012 [employer] Patient Information 2014 [employer].  

## 2013-07-24 ENCOUNTER — Ambulatory Visit (HOSPITAL_BASED_OUTPATIENT_CLINIC_OR_DEPARTMENT_OTHER)
Admission: RE | Admit: 2013-07-24 | Discharge: 2013-07-24 | Disposition: A | Discharge status: Home / Self Care | Payer: Medicare Other | Source: Ambulatory Visit | Attending: Obstetrics and Gynecology | Admitting: Obstetrics and Gynecology

## 2013-07-24 DIAGNOSIS — Z1231 Encounter for screening mammogram for malignant neoplasm of breast: Secondary | ICD-10-CM | POA: Insufficient documentation

## 2013-09-29 ENCOUNTER — Emergency Department (HOSPITAL_BASED_OUTPATIENT_CLINIC_OR_DEPARTMENT_OTHER)
Admission: EM | Admit: 2013-09-29 | Discharge: 2013-09-29 | Disposition: A | Discharge status: Home / Self Care | Payer: Medicare Other | Attending: Emergency Medicine | Admitting: Emergency Medicine

## 2013-09-29 ENCOUNTER — Encounter (HOSPITAL_BASED_OUTPATIENT_CLINIC_OR_DEPARTMENT_OTHER): Payer: Self-pay | Admitting: Emergency Medicine

## 2013-09-29 DIAGNOSIS — Z7902 Long term (current) use of antithrombotics/antiplatelets: Secondary | ICD-10-CM | POA: Insufficient documentation

## 2013-09-29 DIAGNOSIS — Z9861 Coronary angioplasty status: Secondary | ICD-10-CM | POA: Insufficient documentation

## 2013-09-29 DIAGNOSIS — Z79899 Other long term (current) drug therapy: Secondary | ICD-10-CM | POA: Insufficient documentation

## 2013-09-29 DIAGNOSIS — I10 Essential (primary) hypertension: Secondary | ICD-10-CM | POA: Insufficient documentation

## 2013-09-29 DIAGNOSIS — E7800 Pure hypercholesterolemia, unspecified: Secondary | ICD-10-CM | POA: Insufficient documentation

## 2013-09-29 DIAGNOSIS — M069 Rheumatoid arthritis, unspecified: Secondary | ICD-10-CM | POA: Insufficient documentation

## 2013-09-29 DIAGNOSIS — Z7982 Long term (current) use of aspirin: Secondary | ICD-10-CM | POA: Insufficient documentation

## 2013-09-29 MED ORDER — TRAMADOL HCL 50 MG PO TABS: 50.0000 mg | ORAL_TABLET | Freq: Four times a day (QID) | ORAL | Status: DC | PRN

## 2013-09-29 MED ORDER — PREDNISONE 20 MG PO TABS: ORAL_TABLET | ORAL | Status: DC

## 2013-09-29 NOTE — Discharge Instructions (Signed)
Rheumatoid Arthritis Rheumatoid arthritis is a long-term (chronic) inflammatory disease that causes pain, swelling, and stiffness of the joints. It can affect the entire body, including the eyes and lungs. The effects of rheumatoid arthritis vary widely among those with the condition. CAUSES  The cause of rheumatoid arthritis is not known. It tends to run in families and is more common in women. Certain cells of the body's natural defense system (immune system) do not work properly and begin to attack healthy joints. It primarily involves the connective tissue that lines the joints (synovial membrane). This can cause damage to the joint. SYMPTOMS   Pain, stiffness, swelling, and decreased motion of many joints, especially in the hands and feet.  Stiffness that is worse in the morning. It may last 1 2 hours or longer.  Numbness and tingling in the hands.  Fatigue.  Loss of appetite.  Weight loss.  Low-grade fever.  Dry eyes and mouth.  Firm lumps (rheumatoid nodules) that grow beneath the skin in areas such as the elbows and hands. DIAGNOSIS  Diagnosis is based on the symptoms described, an exam, and blood tests. Sometimes, X-rays are helpful. TREATMENT  The goals of treatment are to relieve pain, reduce inflammation, and to slow down or stop joint damage and disability. Methods vary and may include:  Maintaining a balance of rest, exercise, and proper nutrition.  Medicines:  Pain relievers (analgesics).  Corticosteroids and nonsteroidal anti-inflammatory drugs (NSAIDs) to reduce inflammation.  Disease-modifying antirheumatic drugs (DMARDs) to try to slow the course of the disease.  Biologic response modifiers to reduce inflammation and damage.  Physical therapy and occupational therapy.  Surgery for patients with severe joint damage. Joint replacement or fusing of joints may be needed.  Routine monitoring and ongoing care, such as office visits, blood and urine tests, and  X-rays. HOME CARE INSTRUCTIONS   Remain physically active and reduce activity when the disease gets worse.  Eat a well-balanced diet.  Put heat on affected joints when you wake up and before activities. Keep the heat on the affected joint for as long as directed by your caregiver.  Put ice on affected joints following activities or exercising.  Put ice in a plastic bag.  Place a towel between your skin and the bag.  Leave the ice on for 15-20 minutes, 03-04 times a day.  Take all medicines and supplements as directed by your caregiver.  Use splints as directed by your caregiver. Splints help maintain joint position and function.  Do not sleep with pillows under your knees. This may lead to spasms.  Participate in a self-management program to keep current with the latest treatment and coping skills. SEEK IMMEDIATE MEDICAL CARE IF:  You have fainting episodes.  You have periods of extreme weakness.  You rapidly develop a hot, painful joint that is more severe than usual joint aches.  You have chills.  You have a fever. MAKE SURE YOU:  Understand these instructions.  Will watch your condition.  Will get help right away if you are not doing well or get worse. FOR MORE INFORMATION  American College [employer] Rheumatology: www.rheumatology.org Arthritis Foundation: www.arthritis.org Document Released: 05/15/2000 Document Revised: 11/17/2011 Document Reviewed: 06/24/2011 [employer] Patient Information 2014 [employer].  

## 2013-09-29 NOTE — ED Notes (Signed)
Pt c/o joint pain "all over" x 1 month and has appt with rheumatologist end of June. Pt sts reports elevated ESR on 04/18 with Dr. Lajuan Lines.

## 2013-09-29 NOTE — ED Provider Notes (Signed)
CSN: [id number]     Arrival date & time 09/29/13  0808 History   First MD Initiated Contact with Patient 09/29/13 [phone]     Chief Complaint  Patient presents with  . Joint Pain     (Consider location/radiation/quality/duration/timing/severity/associated sxs/prior Treatment) HPI Comments: Patient presents with joint pain. She states about 1-1/2 months ago she started having some joint pain. She describes throbbing pain to both of her wrist, her shoulders, her knees, and her ankles. She does have some tenderness in her left hip. She has some pain in her left shoulder however she's not sure if it's related other joints or from her rotator cuff. She has a history of a rotator cuff tear in the shoulder. She's been followed by her doctor with [facility], Dr. Theodoro Grist. Initially it was felt that the joint pain might be related to her cholesterol medicine. This was stopped however the joint pain did not improve. She had labs done which showed an elevated rheumatoid factor. She's been referred to a rheumatologist however she doesn't have appointment until the end of June. She's noted some swelling in her right wrists however most of her other joints are just throbbing and not swollen. She denies any fevers or chills. She denies any other recent illnesses. There is no urinary symptoms. She's currently not taking any medications for the joint pain other than Aleve and ibuprofen.   Past Medical History  Diagnosis Date  . Carotid arterial disease   . Hypertension   . High cholesterol    Past Surgical History  Procedure Laterality Date  . Coronary angioplasty with stent placement     No family history on file. History  Substance Use Topics  . Smoking status: Never Smoker   . Smokeless tobacco: Never Used  . Alcohol Use: No   OB History   Grav Para Term Preterm Abortions TAB SAB Ect Mult Living                 Review of Systems  Constitutional: Negative for fever, chills, diaphoresis and fatigue.   HENT: Negative for congestion, rhinorrhea and sneezing.   Eyes: Negative.   Respiratory: Negative for cough, chest tightness and shortness of breath.   Cardiovascular: Negative for chest pain and leg swelling.  Gastrointestinal: Negative for nausea, vomiting, abdominal pain, diarrhea and blood in stool.  Genitourinary: Negative for frequency, hematuria, flank pain and difficulty urinating.  Musculoskeletal: Positive for arthralgias and joint swelling. Negative for back pain.  Skin: Negative for rash.  Neurological: Negative for dizziness, speech difficulty, weakness, numbness and headaches.      Allergies  Review of patient's allergies indicates no known allergies.  Home Medications   Prior to Admission medications   Medication Sig Start Date End Date Taking? Authorizing Provider  GABAPENTIN, PHN, PO Take by mouth.   Yes Historical Provider, MD  aspirin 81 MG tablet Take 81 mg by mouth at bedtime.     Historical Provider, MD  atorvastatin (LIPITOR) 80 MG tablet Take 80 mg by mouth daily at 6 PM.     Historical Provider, MD  cetirizine (ZYRTEC) 10 MG tablet Take 10 mg by mouth at bedtime.    Historical Provider, MD  clopidogrel (PLAVIX) 75 MG tablet Take 75 mg by mouth daily.    Historical Provider, MD  ezetimibe (ZETIA) 10 MG tablet Take 10 mg by mouth at bedtime.     Historical Provider, MD  fosinopril (MONOPRIL) 20 MG tablet Take 20 mg by mouth daily.  Historical Provider, MD  isosorbide mononitrate (IMDUR) 30 MG 24 hr tablet Take 30 mg by mouth daily.    Historical Provider, MD  metoprolol succinate (TOPROL-XL) 50 MG 24 hr tablet Take 50 mg by mouth daily. Take with or immediately following a meal.    Historical Provider, MD  predniSONE (DELTASONE) 20 MG tablet 3 tabs po day one, then 2 tabs daily x 2 days, then one tablet daily for 5 days, then 1/2 tablet daily for 5 days 09/29/13   Rolan Bucco, MD  traMADol (ULTRAM) 50 MG tablet Take 1 tablet (50 mg total) by mouth every 6  (six) hours as needed. 09/29/13   Rolan Bucco, MD   BP 174/99  Pulse 78  Temp(Src) 97.3 F (36.3 C) (Oral)  Resp 18  SpO2 100% Physical Exam  Constitutional: She is oriented to person, place, and time. She appears well-developed and well-nourished.  HENT:  Head: Normocephalic and atraumatic.  Eyes: Pupils are equal, round, and reactive to light.  Neck: Normal range of motion. Neck supple.  Cardiovascular: Normal rate, regular rhythm and normal heart sounds.   Pulmonary/Chest: Effort normal and breath sounds normal. No respiratory distress. She has no wheezes. She has no rales. She exhibits no tenderness.  Abdominal: Soft. Bowel sounds are normal. There is no tenderness. There is no rebound and no guarding.  Musculoskeletal: Normal range of motion. She exhibits edema.  She has tenderness on palpation and range of motion multiple joints. She has some mild swelling over her right wrist. There is no erythema or warmth in any of the joints.  Lymphadenopathy:    She has no cervical adenopathy.  Neurological: She is alert and oriented to person, place, and time.  Skin: Skin is warm and dry. No rash noted.  Psychiatric: She has a normal mood and affect.    ED Course  Procedures (including critical care time) Labs Review Labs Reviewed - No data to display  Imaging Review No results found.   EKG Interpretation None      MDM   Final diagnoses:  Rheumatoid arthritis    I reviewed the labs with the patient brought in with her. She had CMP, uric acid, rheumatoid factor that was done on April 18. These were unremarkable other than markedly elevated rheumatoid factor. Given this I did not feel that she needed further blood work today. I do feel that it would be helpful to put her on a course of prednisone. I also gave her prescription for tramadol. She states she's taken prednisone before and hasn't had stomach issues with it. I gave her another rheumatologist to potentially followup  with it as she may be able to get in there sooner. Otherwise I advised her followup with her primary care physician.    Rolan Bucco, MD 09/29/13 [phone]

## 2013-09-29 NOTE — ED Notes (Signed)
MD at bedside. 

## 2013-12-24 ENCOUNTER — Encounter (HOSPITAL_BASED_OUTPATIENT_CLINIC_OR_DEPARTMENT_OTHER): Payer: Self-pay | Admitting: Emergency Medicine

## 2013-12-24 ENCOUNTER — Emergency Department (HOSPITAL_BASED_OUTPATIENT_CLINIC_OR_DEPARTMENT_OTHER)
Admission: EM | Admit: 2013-12-24 | Discharge: 2013-12-24 | Disposition: A | Discharge status: Home / Self Care | Payer: Medicare Other | Attending: Emergency Medicine | Admitting: Emergency Medicine

## 2013-12-24 DIAGNOSIS — E7800 Pure hypercholesterolemia, unspecified: Secondary | ICD-10-CM | POA: Insufficient documentation

## 2013-12-24 DIAGNOSIS — M79605 Pain in left leg: Secondary | ICD-10-CM

## 2013-12-24 DIAGNOSIS — Z7982 Long term (current) use of aspirin: Secondary | ICD-10-CM | POA: Diagnosis not present

## 2013-12-24 DIAGNOSIS — M129 Arthropathy, unspecified: Secondary | ICD-10-CM | POA: Diagnosis not present

## 2013-12-24 DIAGNOSIS — I10 Essential (primary) hypertension: Secondary | ICD-10-CM | POA: Diagnosis not present

## 2013-12-24 DIAGNOSIS — Z79899 Other long term (current) drug therapy: Secondary | ICD-10-CM | POA: Diagnosis not present

## 2013-12-24 DIAGNOSIS — M25579 Pain in unspecified ankle and joints of unspecified foot: Secondary | ICD-10-CM | POA: Diagnosis present

## 2013-12-24 DIAGNOSIS — Z7902 Long term (current) use of antithrombotics/antiplatelets: Secondary | ICD-10-CM | POA: Diagnosis not present

## 2013-12-24 DIAGNOSIS — Z9861 Coronary angioplasty status: Secondary | ICD-10-CM | POA: Insufficient documentation

## 2013-12-24 HISTORY — DX: Unspecified osteoarthritis, unspecified site: M19.90

## 2013-12-24 MED ORDER — TRAMADOL HCL 50 MG PO TABS: 50.0000 mg | ORAL_TABLET | Freq: Four times a day (QID) | ORAL | Status: DC | PRN

## 2013-12-24 MED ORDER — PREDNISONE 20 MG PO TABS: ORAL_TABLET | ORAL | Status: DC

## 2013-12-24 NOTE — ED Provider Notes (Signed)
CSN: [id number]     Arrival date & time 12/24/13  1215 History   First MD Initiated Contact with Patient 12/24/13 1231     Chief Complaint  Patient presents with  . Foot Pain    HPI Pt has history of RA.  When she woke up she experienced pain in her left foot.  She has not had any recent falls or injury. Her foot also feels numb.  The pain in her foot also seems to extend up towards her leg. She's not having any trouble with back pain. She's not having any trouble with any swelling. She denies any fevers. Patient feels that these symptoms are very similar to when she's had flares associated with rheumatoid arthritis in the past. Previously she has taken oral steroids with good results. Patient requests a prescription for steroids. Past Medical History  Diagnosis Date  . Carotid arterial disease   . Hypertension   . High cholesterol   . Arthritis    Past Surgical History  Procedure Laterality Date  . Coronary angioplasty with stent placement     No family history on file. History  Substance Use Topics  . Smoking status: Never Smoker   . Smokeless tobacco: Never Used  . Alcohol Use: No   OB History   Grav Para Term Preterm Abortions TAB SAB Ect Mult Living                 Review of Systems  All other systems reviewed and are negative.     Allergies  Review of patient's allergies indicates no known allergies.  Home Medications   Prior to Admission medications   Medication Sig Start Date End Date Taking? Authorizing Provider  fosinopril (MONOPRIL) 40 MG tablet Take 40 mg by mouth daily.   Yes Historical Provider, MD  aspirin 81 MG tablet Take 81 mg by mouth at bedtime.     Historical Provider, MD  atorvastatin (LIPITOR) 80 MG tablet Take 80 mg by mouth daily at 6 PM.     Historical Provider, MD  cetirizine (ZYRTEC) 10 MG tablet Take 10 mg by mouth at bedtime.    Historical Provider, MD  clopidogrel (PLAVIX) 75 MG tablet Take 75 mg by mouth daily.    Historical Provider,  MD  ezetimibe (ZETIA) 10 MG tablet Take 10 mg by mouth at bedtime.     Historical Provider, MD  fosinopril (MONOPRIL) 20 MG tablet Take 20 mg by mouth daily.    Historical Provider, MD  GABAPENTIN, PHN, PO Take by mouth.    Historical Provider, MD  isosorbide mononitrate (IMDUR) 30 MG 24 hr tablet Take 30 mg by mouth daily.    Historical Provider, MD  metoprolol succinate (TOPROL-XL) 50 MG 24 hr tablet Take 50 mg by mouth daily. Take with or immediately following a meal.    Historical Provider, MD  predniSONE (DELTASONE) 20 MG tablet 3 tabs po day one, then 2 tabs daily x 2 days, then one tablet daily for 5 days, then 1/2 tablet daily for 5 days 12/24/13   Linwood Dibbles, MD  traMADol (ULTRAM) 50 MG tablet Take 1 tablet (50 mg total) by mouth every 6 (six) hours as needed. 12/24/13   Linwood Dibbles, MD   BP 118/64  Pulse 70  Temp(Src) 98.1 F (36.7 C) (Oral)  Resp 18  Ht 5\' 8"  (1.727 m)  Wt 168 lb (76.204 kg)  BMI 25.55 kg/m2  SpO2 98% Physical Exam  Nursing note and vitals reviewed. Constitutional: She  appears well-developed and well-nourished. No distress.  HENT:  Head: Normocephalic and atraumatic.  Right Ear: External ear normal.  Left Ear: External ear normal.  Eyes: Conjunctivae are normal. Right eye exhibits no discharge. Left eye exhibits no discharge. No scleral icterus.  Neck: Neck supple. No tracheal deviation present.  Cardiovascular: Normal rate, regular rhythm and intact distal pulses.   Pulmonary/Chest: Effort normal and breath sounds normal. No stridor. No respiratory distress. She has no wheezes. She has no rales.  Abdominal: Soft. Bowel sounds are normal. She exhibits no distension. There is no tenderness. There is no rebound and no guarding.  Musculoskeletal: She exhibits no edema.       Left knee: Normal.       Left ankle: Normal.       Lumbar back: She exhibits normal range of motion, no tenderness, no bony tenderness and no swelling.       Left foot: She exhibits  tenderness. She exhibits no swelling.  Neurological: She is alert. She has normal strength. No cranial nerve deficit (no facial droop, extraocular movements intact, no slurred speech) or sensory deficit. She exhibits normal muscle tone. She displays no seizure activity. Coordination normal.  Skin: Skin is warm and dry. No rash noted.  Psychiatric: She has a normal mood and affect.    ED Course  Procedures (including critical care time)   MDM   Final diagnoses:  Pain of left lower extremity    There is no evidence of infection. She has normal pulses without evidence of decreased blood flow. Not certain that her symptoms are a definite rheumatoid flare however the patient states this is very similar and she's had good results with steroids. Do not think it is unreasonable to try a course of oral steroids for this..  I considered radiculopathy associated with lumbar nerve root compression. No acute neuro findings.  The steroids may help with this as well.  Follow up with PCP  Linwood Dibbles, MD 12/24/13 [phone]

## 2013-12-24 NOTE — ED Notes (Signed)
Pt has hx of RA. She sts she woke up this morning with left foot pain radiating up her left leg. She sts that she usually has the RA pain in her hands and wrists. Pt denies any injury.

## 2014-02-02 ENCOUNTER — Emergency Department (HOSPITAL_BASED_OUTPATIENT_CLINIC_OR_DEPARTMENT_OTHER): Payer: Medicare Other

## 2014-02-02 ENCOUNTER — Encounter (HOSPITAL_BASED_OUTPATIENT_CLINIC_OR_DEPARTMENT_OTHER): Payer: Self-pay | Admitting: Emergency Medicine

## 2014-02-02 ENCOUNTER — Emergency Department (HOSPITAL_BASED_OUTPATIENT_CLINIC_OR_DEPARTMENT_OTHER)
Admission: EM | Admit: 2014-02-02 | Discharge: 2014-02-02 | Disposition: A | Discharge status: Home / Self Care | Payer: Medicare Other | Attending: Emergency Medicine | Admitting: Emergency Medicine

## 2014-02-02 DIAGNOSIS — E7800 Pure hypercholesterolemia, unspecified: Secondary | ICD-10-CM | POA: Insufficient documentation

## 2014-02-02 DIAGNOSIS — IMO0002 Reserved for concepts with insufficient information to code with codable children: Secondary | ICD-10-CM | POA: Diagnosis not present

## 2014-02-02 DIAGNOSIS — M129 Arthropathy, unspecified: Secondary | ICD-10-CM | POA: Diagnosis not present

## 2014-02-02 DIAGNOSIS — M79671 Pain in right foot: Secondary | ICD-10-CM

## 2014-02-02 DIAGNOSIS — I10 Essential (primary) hypertension: Secondary | ICD-10-CM | POA: Insufficient documentation

## 2014-02-02 DIAGNOSIS — M79609 Pain in unspecified limb: Secondary | ICD-10-CM | POA: Insufficient documentation

## 2014-02-02 DIAGNOSIS — Z79899 Other long term (current) drug therapy: Secondary | ICD-10-CM | POA: Insufficient documentation

## 2014-02-02 MED ORDER — HYDROCODONE-ACETAMINOPHEN 5-325 MG PO TABS
1.0000 | ORAL_TABLET | Freq: Once | ORAL | Status: AC
  Administered 2014-02-02: 1 via ORAL
  Filled 2014-02-02: qty 1

## 2014-02-02 MED ORDER — HYDROCODONE-ACETAMINOPHEN 5-325 MG PO TABS: 1.0000 | ORAL_TABLET | Freq: Four times a day (QID) | ORAL | Status: DC | PRN

## 2014-02-02 NOTE — Discharge Instructions (Signed)
Foot Sprain The muscles and cord like structures which attach muscle to bone (tendons) that surround the feet are made up of units. A foot sprain can occur at the weakest spot in any of these units. This condition is most often caused by injury to or overuse of the foot, as from playing contact sports, or aggravating a previous injury, or from poor conditioning, or obesity. SYMPTOMS  Pain with movement of the foot.  Tenderness and swelling at the injury site.  Loss of strength is present in moderate or severe sprains. THE THREE GRADES OR SEVERITY OF FOOT SPRAIN ARE:  Mild (Grade I): Slightly pulled muscle without tearing of muscle or tendon fibers or loss of strength.  Moderate (Grade II): Tearing of fibers in a muscle, tendon, or at the attachment to bone, with small decrease in strength.  Severe (Grade III): Rupture of the muscle-tendon-bone attachment, with separation of fibers. Severe sprain requires surgical repair. Often repeating (chronic) sprains are caused by overuse. Sudden (acute) sprains are caused by direct injury or over-use. DIAGNOSIS  Diagnosis of this condition is usually by your own observation. If problems continue, a caregiver may be required for further evaluation and treatment. X-rays may be required to make sure there are not breaks in the bones (fractures) present. Continued problems may require physical therapy for treatment. PREVENTION  Use strength and conditioning exercises appropriate for your sport.  Warm up properly prior to working out.  Use athletic shoes that are made for the sport you are participating in.  Allow adequate time for healing. Early return to activities makes repeat injury more likely, and can lead to an unstable arthritic foot that can result in prolonged disability. Mild sprains generally heal in 3 to 10 days, with moderate and severe sprains taking 2 to 10 weeks. Your caregiver can help you determine the proper time required for  healing. HOME CARE INSTRUCTIONS   Apply ice to the injury for 15-20 minutes, 03-04 times per day. Put the ice in a plastic bag and place a towel between the bag of ice and your skin.  An elastic wrap (like an Ace bandage) may be used to keep swelling down.  Keep foot above the level of the heart, or at least raised on a footstool, when swelling and pain are present.  Try to avoid use other than gentle range of motion while the foot is painful. Do not resume use until instructed by your caregiver. Then begin use gradually, not increasing use to the point of pain. If pain does develop, decrease use and continue the above measures, gradually increasing activities that do not cause discomfort, until you gradually achieve normal use.  Use crutches if and as instructed, and for the length of time instructed.  Keep injured foot and ankle wrapped between treatments.  Massage foot and ankle for comfort and to keep swelling down. Massage from the toes up towards the knee.  Only take over-the-counter or prescription medicines for pain, discomfort, or fever as directed by your caregiver. SEEK IMMEDIATE MEDICAL CARE IF:   Your pain and swelling increase, or pain is not controlled with medications.  You have loss of feeling in your foot or your foot turns cold or blue.  You develop new, unexplained symptoms, or an increase of the symptoms that brought you to your caregiver. MAKE SURE YOU:   Understand these instructions.  Will watch your condition.  Will get help right away if you are not doing well or get worse. Document Released:   11/07/2001 Document Revised: 08/10/2011 Document Reviewed: 01/05/2008 [employer] Patient Information 2015 [employer]. This information is not intended to replace advice given to you by your health care provider. Make sure you discuss any questions you have with your health care provider.  

## 2014-02-02 NOTE — ED Provider Notes (Signed)
CSN: [id number]     Arrival date & time 02/02/14  2202 History   First MD Initiated Contact with Patient 02/02/14 [phone]     Chief Complaint  Patient presents with  . Foot Pain     (Consider location/radiation/quality/duration/timing/severity/associated sxs/prior Treatment) HPI  This is a 68 year old female with a history of rheumatoid arthritis, hypertension, hypercholesterolemia who presents with right foot pain. Patient reports that she exercise on her treadmill on Sunday. Since that time she's been having right midfoot pain. She thought it was associated with her arthritis and what her rheumatologist. She is currently on prednisone. She reports that she's continuing to have pain. She is taking Tylenol without relief.  She is also use ice and elevation which has helped some.  She describes the pain as achy. It is currently 8/10. It is worse with ambulation. She denies any known injury.  Past Medical History  Diagnosis Date  . Carotid arterial disease   . Hypertension   . High cholesterol   . Arthritis    Past Surgical History  Procedure Laterality Date  . Coronary angioplasty with stent placement     History reviewed. No pertinent family history. History  Substance Use Topics  . Smoking status: Never Smoker   . Smokeless tobacco: Never Used  . Alcohol Use: No   OB History   Grav Para Term Preterm Abortions TAB SAB Ect Mult Living                 Review of Systems  Musculoskeletal:       Right foot pain  Skin: Negative for color change and wound.  All other systems reviewed and are negative.     Allergies  Review of patient's allergies indicates no known allergies.  Home Medications   Prior to Admission medications   Medication Sig Start Date End Date Taking? Authorizing Provider  aspirin 81 MG tablet Take 81 mg by mouth at bedtime.     Historical Provider, MD  atorvastatin (LIPITOR) 80 MG tablet Take 80 mg by mouth daily at 6 PM.     Historical Provider, MD   cetirizine (ZYRTEC) 10 MG tablet Take 10 mg by mouth at bedtime.    Historical Provider, MD  clopidogrel (PLAVIX) 75 MG tablet Take 75 mg by mouth daily.    Historical Provider, MD  ezetimibe (ZETIA) 10 MG tablet Take 10 mg by mouth at bedtime.     Historical Provider, MD  fosinopril (MONOPRIL) 20 MG tablet Take 20 mg by mouth daily.    Historical Provider, MD  fosinopril (MONOPRIL) 40 MG tablet Take 40 mg by mouth daily.    Historical Provider, MD  GABAPENTIN, PHN, PO Take by mouth.    Historical Provider, MD  HYDROcodone-acetaminophen (NORCO/VICODIN) 5-325 MG per tablet Take 1 tablet by mouth every 6 (six) hours as needed for moderate pain or severe pain. 02/02/14   Shon Baton, MD  isosorbide mononitrate (IMDUR) 30 MG 24 hr tablet Take 30 mg by mouth daily.    Historical Provider, MD  metoprolol succinate (TOPROL-XL) 50 MG 24 hr tablet Take 50 mg by mouth daily. Take with or immediately following a meal.    Historical Provider, MD  predniSONE (DELTASONE) 20 MG tablet 3 tabs po day one, then 2 tabs daily x 2 days, then one tablet daily for 5 days, then 1/2 tablet daily for 5 days 12/24/13   Linwood Dibbles, MD  traMADol (ULTRAM) 50 MG tablet Take 1 tablet (50 mg total) by  mouth every 6 (six) hours as needed. 12/24/13   Linwood Dibbles, MD   BP 154/74  Pulse 76  Temp(Src) 98 F (36.7 C) (Oral)  Resp 16  Ht 5' 8.5" (1.74 m)  Wt 170 lb (77.111 kg)  BMI 25.47 kg/m2  SpO2 100% Physical Exam  Nursing note and vitals reviewed. Constitutional: She is oriented to person, place, and time. She appears well-developed and well-nourished. No distress.  HENT:  Head: Normocephalic and atraumatic.  Cardiovascular: Normal rate and regular rhythm.   Pulmonary/Chest: Effort normal. No respiratory distress.  Musculoskeletal:  Tenderness to palpation over the distal fifth metatarsal, no obvious swelling or deformity, no overlying skin changes  Neurological: She is alert and oriented to person, place, and time.   Skin: Skin is warm and dry.  Psychiatric: She has a normal mood and affect.    ED Course  Procedures (including critical care time) Labs Review Labs Reviewed - No data to display  Imaging Review Dg Foot Complete Right  02/02/2014   CLINICAL DATA:  Right foot pain status post over exertion.  EXAM: RIGHT FOOT COMPLETE - 3+ VIEW  COMPARISON:  None.  FINDINGS: The bones of the right foot are adequately mineralized. There is no periosteal reaction. There is no acute fracture nor dislocation. There is no significant degenerative change. The overlying soft tissues are unremarkable.  IMPRESSION: There is no acute bony abnormality of the right foot.   Electronically Signed   By: David  [place]   On: 02/02/2014 10:05     EKG Interpretation None      MDM   Final diagnoses:  Right foot pain    Patient presents with right foot pain. He is nontoxic on exam. Denies injury. She is tenderness to palpation of the metatarsal without any overlying skin changes or obvious deformity. X-rays negative for fracture. Discussed with patient continued supportive care at home and making sure she had good fitting shoes. Patient was given Norco and will be given a short course of pain medication at discharge. She is to followup with her primary care physician and rheumatologist. At this time the etiology of her pain is unknown; however, there is not appear to be any emergent condition.  After history, exam, and medical workup I feel the patient has been appropriately medically screened and is safe for discharge home. Pertinent diagnoses were discussed with the patient. Patient was given return precautions.    Shon Baton, MD 02/02/14 [phone]

## 2014-02-02 NOTE — ED Notes (Signed)
Pt amb to room 4 with slow, steady gait. Pt reports she walked on the treadmill for exercise on Sunday, has had swelling and pain to top of right foot since then. Saw her rheumatologist on Monday, rx prednisone, but pt states she is not feeling any better.  Pt declines gown, assisted to position of comfort.

## 2014-06-10 ENCOUNTER — Emergency Department (HOSPITAL_BASED_OUTPATIENT_CLINIC_OR_DEPARTMENT_OTHER)
Admission: EM | Admit: 2014-06-10 | Discharge: 2014-06-10 | Disposition: A | Discharge status: Home / Self Care | Payer: Medicare Other | Attending: Emergency Medicine | Admitting: Emergency Medicine

## 2014-06-10 ENCOUNTER — Emergency Department (HOSPITAL_BASED_OUTPATIENT_CLINIC_OR_DEPARTMENT_OTHER): Payer: Medicare Other

## 2014-06-10 ENCOUNTER — Encounter (HOSPITAL_BASED_OUTPATIENT_CLINIC_OR_DEPARTMENT_OTHER): Payer: Self-pay | Admitting: *Deleted

## 2014-06-10 DIAGNOSIS — Z7952 Long term (current) use of systemic steroids: Secondary | ICD-10-CM | POA: Insufficient documentation

## 2014-06-10 DIAGNOSIS — E780 Pure hypercholesterolemia: Secondary | ICD-10-CM | POA: Diagnosis not present

## 2014-06-10 DIAGNOSIS — I2510 Atherosclerotic heart disease of native coronary artery without angina pectoris: Secondary | ICD-10-CM | POA: Insufficient documentation

## 2014-06-10 DIAGNOSIS — M549 Dorsalgia, unspecified: Secondary | ICD-10-CM

## 2014-06-10 DIAGNOSIS — I10 Essential (primary) hypertension: Secondary | ICD-10-CM | POA: Diagnosis not present

## 2014-06-10 DIAGNOSIS — M545 Low back pain, unspecified: Secondary | ICD-10-CM

## 2014-06-10 DIAGNOSIS — Z79899 Other long term (current) drug therapy: Secondary | ICD-10-CM | POA: Insufficient documentation

## 2014-06-10 DIAGNOSIS — N390 Urinary tract infection, site not specified: Secondary | ICD-10-CM

## 2014-06-10 DIAGNOSIS — Z9861 Coronary angioplasty status: Secondary | ICD-10-CM | POA: Diagnosis not present

## 2014-06-10 DIAGNOSIS — Z7902 Long term (current) use of antithrombotics/antiplatelets: Secondary | ICD-10-CM | POA: Diagnosis not present

## 2014-06-10 DIAGNOSIS — M1990 Unspecified osteoarthritis, unspecified site: Secondary | ICD-10-CM | POA: Insufficient documentation

## 2014-06-10 DIAGNOSIS — Z7982 Long term (current) use of aspirin: Secondary | ICD-10-CM | POA: Diagnosis not present

## 2014-06-10 LAB — URINALYSIS, ROUTINE W REFLEX MICROSCOPIC
Bilirubin Urine: NEGATIVE
GLUCOSE, UA: NEGATIVE mg/dL
Hgb urine dipstick: NEGATIVE
KETONES UR: NEGATIVE mg/dL
Nitrite: NEGATIVE
PH: 5.5 (ref 5.0–8.0)
PROTEIN: NEGATIVE mg/dL
Specific Gravity, Urine: 1.017 (ref 1.005–1.030)
Urobilinogen, UA: 0.2 mg/dL (ref 0.0–1.0)

## 2014-06-10 LAB — URINE MICROSCOPIC-ADD ON

## 2014-06-10 MED ORDER — CEPHALEXIN 500 MG PO CAPS: 500.0000 mg | ORAL_CAPSULE | Freq: Four times a day (QID) | ORAL | Status: DC

## 2014-06-10 MED ORDER — TRAMADOL HCL 50 MG PO TABS
50.0000 mg | ORAL_TABLET | Freq: Once | ORAL | Status: DC
  Filled 2014-06-10: qty 1

## 2014-06-10 NOTE — ED Notes (Addendum)
Pt refused tramadol states she took it yesterday and did not help. States she took ibuprofen prior to coming in. Pt is driving herself. Pt states she can not void at this time.

## 2014-06-10 NOTE — ED Provider Notes (Signed)
CSN: [id number]     Arrival date & time 06/10/14  0906 History   First MD Initiated Contact with Patient 06/10/14 [phone]     Chief Complaint  Patient presents with  . Back Pain     (Consider location/radiation/quality/duration/timing/severity/associated sxs/prior Treatment) HPI 69 year old female who presents today complaining of low back pain. She states that she feels that she may have injured it on Wednesday with lifting at work. Pain has been present in the mid low back area without radiation. It is worsened with movement. She denies any lateralized weakness, numbness, urinary retention symptoms or problems with bowels or sensation. Had no direct trauma such as a fall. The manipulation to her back such as injections or surgery. She has a history of rheumatoid arthritis but is not currently on any immunosuppressants nor has been during the immediate preceding weeks to month.. Past Medical History  Diagnosis Date  . Carotid arterial disease   . Hypertension   . High cholesterol   . Arthritis    Past Surgical History  Procedure Laterality Date  . Coronary angioplasty with stent placement     No family history on file. History  Substance Use Topics  . Smoking status: Never Smoker   . Smokeless tobacco: Never Used  . Alcohol Use: No   OB History    No data available     Review of Systems  All other systems reviewed and are negative.     Allergies  Review of patient's allergies indicates no known allergies.  Home Medications   Prior to Admission medications   Medication Sig Start Date End Date Taking? Authorizing Provider  aspirin 81 MG tablet Take 81 mg by mouth at bedtime.    Yes Historical Provider, MD  clopidogrel (PLAVIX) 75 MG tablet Take 75 mg by mouth daily.   Yes Historical Provider, MD  ezetimibe (ZETIA) 10 MG tablet Take 10 mg by mouth at bedtime.    Yes Historical Provider, MD  metoprolol succinate (TOPROL-XL) 50 MG 24 hr tablet Take 50 mg by mouth daily.  Take with or immediately following a meal.   Yes Historical Provider, MD  atorvastatin (LIPITOR) 80 MG tablet Take 80 mg by mouth daily at 6 PM.     Historical Provider, MD  cetirizine (ZYRTEC) 10 MG tablet Take 10 mg by mouth at bedtime.    Historical Provider, MD  fosinopril (MONOPRIL) 20 MG tablet Take 20 mg by mouth daily.    Historical Provider, MD  fosinopril (MONOPRIL) 40 MG tablet Take 40 mg by mouth daily.    Historical Provider, MD  GABAPENTIN, PHN, PO Take by mouth.    Historical Provider, MD  HYDROcodone-acetaminophen (NORCO/VICODIN) 5-325 MG per tablet Take 1 tablet by mouth every 6 (six) hours as needed for moderate pain or severe pain. 02/02/14   Shon Baton, MD  isosorbide mononitrate (IMDUR) 30 MG 24 hr tablet Take 30 mg by mouth daily.    Historical Provider, MD  predniSONE (DELTASONE) 20 MG tablet 3 tabs po day one, then 2 tabs daily x 2 days, then one tablet daily for 5 days, then 1/2 tablet daily for 5 days 12/24/13   Linwood Dibbles, MD  traMADol (ULTRAM) 50 MG tablet Take 1 tablet (50 mg total) by mouth every 6 (six) hours as needed. 12/24/13   Linwood Dibbles, MD   BP 96/58 mmHg  Pulse 83  Temp(Src) 98.2 F (36.8 C) (Oral)  Resp 18  Ht 5\' 8"  (1.727 m)  Wt 165 lb (  74.844 kg)  BMI 25.09 kg/m2  SpO2 97% Physical Exam  Constitutional: She is oriented to person, place, and time. She appears well-developed and well-nourished.  HENT:  Head: Normocephalic and atraumatic.  Right Ear: Tympanic membrane and external ear normal.  Left Ear: Tympanic membrane and external ear normal.  Nose: Nose normal. Right sinus exhibits no maxillary sinus tenderness and no frontal sinus tenderness. Left sinus exhibits no maxillary sinus tenderness and no frontal sinus tenderness.  Eyes: Conjunctivae and EOM are normal. Pupils are equal, round, and reactive to light. Right eye exhibits no nystagmus. Left eye exhibits no nystagmus.  Neck: Normal range of motion. Neck supple.  Cardiovascular: Normal  rate, regular rhythm, normal heart sounds and intact distal pulses.   Pulmonary/Chest: Effort normal and breath sounds normal. No respiratory distress. She exhibits no tenderness.  Abdominal: Soft. Bowel sounds are normal. She exhibits no distension and no mass. There is no tenderness.  Musculoskeletal: Normal range of motion. She exhibits no edema or tenderness.  Neurological: She is alert and oriented to person, place, and time. She has normal strength and normal reflexes. No sensory deficit. She displays a negative Romberg sign. GCS eye subscore is 4. GCS verbal subscore is 5. GCS motor subscore is 6.  Reflex Scores:      Tricep reflexes are 2+ on the right side and 2+ on the left side.      Bicep reflexes are 2+ on the right side and 2+ on the left side.      Brachioradialis reflexes are 2+ on the right side and 2+ on the left side.      Patellar reflexes are 2+ on the right side and 2+ on the left side.      Achilles reflexes are 2+ on the right side and 2+ on the left side. Patient with normal gait without ataxia, shuffling, spasm, or antalgia. Speech is normal without dysarthria, dysphasia, or aphasia. Muscle strength is 5/5 in bilateral shoulders, elbow flexor and extensors, wrist flexor and extensors, and intrinsic hand muscles. 5/5 bilateral lower extremity hip flexors, extensors, knee flexors and extensors, and ankle dorsi and plantar flexors.    Skin: Skin is warm and dry. No rash noted.  Psychiatric: She has a normal mood and affect. Her behavior is normal. Judgment and thought content normal.  Nursing note and vitals reviewed.   ED Course  Procedures (including critical care time) Labs Review Labs Reviewed  URINALYSIS, ROUTINE W REFLEX MICROSCOPIC - Abnormal; Notable for the following:    APPearance CLOUDY (*)    Leukocytes, UA MODERATE (*)    All other components within normal limits  URINE MICROSCOPIC-ADD ON - Abnormal; Notable for the following:    Squamous Epithelial /  LPF MANY (*)    Bacteria, UA MANY (*)    Casts HYALINE CASTS (*)    All other components within normal limits    Imaging Review Dg Lumbar Spine Complete  06/10/2014   CLINICAL DATA:  Low back pain  EXAM: LUMBAR SPINE - COMPLETE 4+ VIEW  COMPARISON:  None.  FINDINGS: Five lumbar type vertebral bodies.  Normal lumbar lordosis.  No evidence of acute fracture. Vertebral body heights are maintained.  Grade 1 spondylolisthesis at L5-S1.  Visualized bony pelvis appears intact.  Aorto bi-iliac stents.  Additional vascular stents at the L2 level.  IMPRESSION: No evidence of acute fracture.  Grade 1 spondylolisthesis at L5-S1.   Electronically Signed   By: Charline Bills M.D.   On: 06/10/2014 10:09  EKG Interpretation None      MDM   Final diagnoses:  Back pain  Midline low back pain without sciatica  UTI (lower urinary tract infection)    69 year old female with complaints of low back pain with no neurological deficits and normal exam. Lumbosacral x-rays do not show any evidence of acute fracture but did show a grade 1 spondylolisthesis at L5-S1. She also has a urinalysis that is equivocal for urinary tract infection. This will be cultured and she will be started on Keflex. I discussed return precautions and need for follow-up with the patient and she voices understanding.    Hilario Quarry, MD 06/10/14 [phone]

## 2014-06-10 NOTE — ED Notes (Signed)
Pt encouraged to get urine specimen. Pt states she will go in few minutes. Family at bedside.

## 2014-06-10 NOTE — ED Notes (Signed)
C/o back pain onset yesterday am. 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 C/o low mid back pain. No other sx. No other injury.

## 2014-06-10 NOTE — Discharge Instructions (Signed)
Urinary Tract Infection A urinary tract infection (UTI) can occur any place along the urinary tract. The tract includes the kidneys, ureters, bladder, and urethra. A type of germ called bacteria often causes a UTI. UTIs are often helped with antibiotic medicine.  HOME CARE   If given, take antibiotics as told by your doctor. Finish them even if you start to feel better.  Drink enough fluids to keep your pee (urine) clear or pale yellow.  Avoid tea, drinks with caffeine, and bubbly (carbonated) drinks.  Pee often. Avoid holding your pee in for a long time.  Pee before and after having sex (intercourse).  Wipe from front to back after you poop (bowel movement) if you are a woman. Use each tissue only once. GET HELP RIGHT AWAY IF:   You have back pain.  You have lower belly (abdominal) pain.  You have chills.  You feel sick to your stomach (nauseous).  You throw up (vomit).  Your burning or discomfort with peeing does not go away.  You have a fever.  Your symptoms are not better in 3 days. MAKE SURE YOU:   Understand these instructions.  Will watch your condition.  Will get help right away if you are not doing well or get worse. Document Released: 11/04/2007 Document Revised: 02/10/2012 Document Reviewed: 12/17/2011 [facility] Patient Information 2015 [place], [place]. This information is not intended to replace advice given to you by your health care provider. Make sure you discuss any questions you have with your health care provider. Back Pain, Adult Low back pain is very common. About 1 in 5 people have back pain.The cause of low back pain is rarely dangerous. The pain often gets better over time.About half of people with a sudden onset of back pain feel better in just 2 weeks. About 8 in 10 people feel better by 6 weeks.  CAUSES Some common causes of back pain include:  Strain of the muscles or ligaments supporting the spine.  Wear and tear (degeneration) of the  spinal discs.  Arthritis.  Direct injury to the back. DIAGNOSIS Most of the time, the direct cause of low back pain is not known.However, back pain can be treated effectively even when the exact cause of the pain is unknown.Answering your caregiver's questions about your overall health and symptoms is one of the most accurate ways to make sure the cause of your pain is not dangerous. If your caregiver needs more information, he or she may order lab work or imaging tests (X-rays or MRIs).However, even if imaging tests show changes in your back, this usually does not require surgery. HOME CARE INSTRUCTIONS For many people, back pain returns.Since low back pain is rarely dangerous, it is often a condition that people can learn to [facility] their own.   Remain active. It is stressful on the back to sit or stand in one place. Do not sit, drive, or stand in one place for more than 30 minutes at a time. Take short walks on level surfaces as soon as pain allows.Try to increase the length of time you walk each day.  Do not stay in bed.Resting more than 1 or 2 days can delay your recovery.  Do not avoid exercise or work.Your body is made to move.It is not dangerous to be active, even though your back may hurt.Your back will likely heal faster if you return to being active before your pain is gone.  Pay attention to your body when you bend and lift. Many people  have less discomfortwhen lifting if they bend their knees, keep the load close to their bodies,and avoid twisting. Often, the most comfortable positions are those that put less stress on your recovering back.  Find a comfortable position to sleep. Use a firm mattress and lie on your side with your knees slightly bent. If you lie on your back, put a pillow under your knees.  Only take over-the-counter or prescription medicines as directed by your caregiver. Over-the-counter medicines to reduce pain and inflammation are often the most  helpful.Your caregiver may prescribe muscle relaxant drugs.These medicines help dull your pain so you can more quickly return to your normal activities and healthy exercise.  Put ice on the injured area.  Put ice in a plastic bag.  Place a towel between your skin and the bag.  Leave the ice on for 15-20 minutes, 03-04 times a day for the first 2 to 3 days. After that, ice and heat may be alternated to reduce pain and spasms.  Ask your caregiver about trying back exercises and gentle massage. This may be of some benefit.  Avoid feeling anxious or stressed.Stress increases muscle tension and can worsen back pain.It is important to recognize when you are anxious or stressed and learn ways to manage it.Exercise is a great option. SEEK MEDICAL CARE IF:  You have pain that is not relieved with rest or medicine.  You have pain that does not improve in 1 week.  You have new symptoms.  You are generally not feeling well. SEEK IMMEDIATE MEDICAL CARE IF:   You have pain that radiates from your back into your legs.  You develop new bowel or bladder control problems.  You have unusual weakness or numbness in your arms or legs.  You develop nausea or vomiting.  You develop abdominal pain.  You feel faint. Document Released: 05/18/2005 Document Revised: 11/17/2011 Document Reviewed: 09/19/2013 [facility] Patient Information 2015 [place], [place]. This information is not intended to replace advice given to you by your health care provider. Make sure you discuss any questions you have with your health care provider.

## 2014-06-14 ENCOUNTER — Emergency Department (HOSPITAL_BASED_OUTPATIENT_CLINIC_OR_DEPARTMENT_OTHER)
Admission: EM | Admit: 2014-06-14 | Discharge: 2014-06-14 | Disposition: A | Discharge status: Home / Self Care | Payer: Medicare Other | Attending: Emergency Medicine | Admitting: Emergency Medicine

## 2014-06-14 ENCOUNTER — Encounter (HOSPITAL_BASED_OUTPATIENT_CLINIC_OR_DEPARTMENT_OTHER): Payer: Self-pay | Admitting: *Deleted

## 2014-06-14 ENCOUNTER — Emergency Department (HOSPITAL_BASED_OUTPATIENT_CLINIC_OR_DEPARTMENT_OTHER): Payer: Medicare Other

## 2014-06-14 DIAGNOSIS — M1990 Unspecified osteoarthritis, unspecified site: Secondary | ICD-10-CM | POA: Diagnosis not present

## 2014-06-14 DIAGNOSIS — M545 Low back pain, unspecified: Secondary | ICD-10-CM

## 2014-06-14 DIAGNOSIS — R1013 Epigastric pain: Secondary | ICD-10-CM | POA: Insufficient documentation

## 2014-06-14 DIAGNOSIS — Z792 Long term (current) use of antibiotics: Secondary | ICD-10-CM | POA: Insufficient documentation

## 2014-06-14 DIAGNOSIS — E876 Hypokalemia: Secondary | ICD-10-CM | POA: Insufficient documentation

## 2014-06-14 DIAGNOSIS — I10 Essential (primary) hypertension: Secondary | ICD-10-CM | POA: Diagnosis not present

## 2014-06-14 DIAGNOSIS — Z7952 Long term (current) use of systemic steroids: Secondary | ICD-10-CM | POA: Insufficient documentation

## 2014-06-14 DIAGNOSIS — Z79899 Other long term (current) drug therapy: Secondary | ICD-10-CM | POA: Diagnosis not present

## 2014-06-14 DIAGNOSIS — Z7982 Long term (current) use of aspirin: Secondary | ICD-10-CM | POA: Diagnosis not present

## 2014-06-14 DIAGNOSIS — I779 Disorder of arteries and arterioles, unspecified: Secondary | ICD-10-CM | POA: Diagnosis not present

## 2014-06-14 DIAGNOSIS — E780 Pure hypercholesterolemia: Secondary | ICD-10-CM | POA: Diagnosis not present

## 2014-06-14 LAB — COMPREHENSIVE METABOLIC PANEL
ALT: 27 U/L (ref 0–35)
ANION GAP: 9 (ref 5–15)
AST: 40 U/L — ABNORMAL HIGH (ref 0–37)
Albumin: 3.3 g/dL — ABNORMAL LOW (ref 3.5–5.2)
Alkaline Phosphatase: 60 U/L (ref 39–117)
BUN: 17 mg/dL (ref 6–23)
CO2: 22 mmol/L (ref 19–32)
Calcium: 9.4 mg/dL (ref 8.4–10.5)
Chloride: 100 mEq/L (ref 96–112)
Creatinine, Ser: 0.93 mg/dL (ref 0.50–1.10)
GFR calc Af Amer: 71 mL/min — ABNORMAL LOW (ref >90)
GFR calc non Af Amer: 61 mL/min — ABNORMAL LOW (ref >90)
Glucose, Bld: 105 mg/dL — ABNORMAL HIGH (ref 70–99)
Potassium: 3.2 mmol/L — ABNORMAL LOW (ref 3.5–5.1)
Sodium: 131 mmol/L — ABNORMAL LOW (ref 135–145)
TOTAL PROTEIN: 7.2 g/dL (ref 6.0–8.3)
Total Bilirubin: 0.5 mg/dL (ref 0.3–1.2)

## 2014-06-14 LAB — CBC WITH DIFFERENTIAL/PLATELET
Basophils Absolute: 0 10*3/uL (ref 0.0–0.1)
Basophils Relative: 0 % (ref 0–1)
EOS PCT: 5 % (ref 0–5)
Eosinophils Absolute: 0.3 10*3/uL (ref 0.0–0.7)
HCT: 35.1 % — ABNORMAL LOW (ref 36.0–46.0)
Hemoglobin: 11.4 g/dL — ABNORMAL LOW (ref 12.0–15.0)
LYMPHS ABS: 1.3 10*3/uL (ref 0.7–4.0)
Lymphocytes Relative: 21 % (ref 12–46)
MCH: 28.9 pg (ref 26.0–34.0)
MCHC: 32.5 g/dL (ref 30.0–36.0)
MCV: 89.1 fL (ref 78.0–100.0)
MONOS PCT: 8 % (ref 3–12)
Monocytes Absolute: 0.5 10*3/uL (ref 0.1–1.0)
Neutro Abs: 4.2 10*3/uL (ref 1.7–7.7)
Neutrophils Relative %: 66 % (ref 43–77)
PLATELETS: 346 10*3/uL (ref 150–400)
RBC: 3.94 MIL/uL (ref 3.87–5.11)
RDW: 14.5 % (ref 11.5–15.5)
WBC: 6.3 10*3/uL (ref 4.0–10.5)

## 2014-06-14 LAB — URINALYSIS, ROUTINE W REFLEX MICROSCOPIC
Bilirubin Urine: NEGATIVE
Glucose, UA: NEGATIVE mg/dL
Hgb urine dipstick: NEGATIVE
KETONES UR: NEGATIVE mg/dL
Nitrite: NEGATIVE
Protein, ur: NEGATIVE mg/dL
SPECIFIC GRAVITY, URINE: 1.011 (ref 1.005–1.030)
Urobilinogen, UA: 0.2 mg/dL (ref 0.0–1.0)
pH: 5.5 (ref 5.0–8.0)

## 2014-06-14 LAB — URINE MICROSCOPIC-ADD ON

## 2014-06-14 LAB — LIPASE, BLOOD: LIPASE: 23 U/L (ref 11–59)

## 2014-06-14 MED ORDER — POTASSIUM CHLORIDE CRYS ER 20 MEQ PO TBCR
20.0000 meq | EXTENDED_RELEASE_TABLET | Freq: Once | ORAL | Status: AC
  Administered 2014-06-14: 20 meq via ORAL
  Filled 2014-06-14: qty 1

## 2014-06-14 MED ORDER — IOHEXOL 300 MG/ML  SOLN
100.0000 mL | Freq: Once | INTRAMUSCULAR | Status: AC | PRN
  Administered 2014-06-14: 100 mL via INTRAVENOUS

## 2014-06-14 MED ORDER — IOHEXOL 300 MG/ML  SOLN
25.0000 mL | Freq: Once | INTRAMUSCULAR | Status: AC | PRN
  Administered 2014-06-14: 25 mL via ORAL

## 2014-06-14 MED ORDER — ONDANSETRON HCL 4 MG/2ML IJ SOLN
4.0000 mg | Freq: Once | INTRAMUSCULAR | Status: AC
  Administered 2014-06-14: 4 mg via INTRAVENOUS
  Filled 2014-06-14: qty 2

## 2014-06-14 NOTE — ED Notes (Signed)
Patient transported to CT 

## 2014-06-14 NOTE — ED Notes (Signed)
Abdominal and back pain. States she was treated for a UTI 4 days ago but the pain is no better.

## 2014-06-14 NOTE — Discharge Instructions (Signed)
You may have some mild colitis on ct scan but it is otherwise without abnormality to explain your back pain.  This needs to be followed up by your primary doctor.  You are given a referral for your back pain.  Please use proper lifting techniques, continue to walk and move about, and use ibuprofen and tylenol for pain.

## 2014-06-14 NOTE — ED Notes (Signed)
C/o back  Was seen here Sunday for same  tx for uti  Pain went away and returned 1-2 hours ago

## 2014-06-14 NOTE — ED Provider Notes (Signed)
CSN: [id number]     Arrival date & time 06/14/14  1922 History  This chart was scribed for Hilario Quarry, MD by Bronson Curb, ED Scribe. This patient was seen in room MH12/MH12 and the patient's care was started at 7:43 PM.   Chief Complaint  Patient presents with  . Abdominal Pain  . Back Pain   Patient is a 69 y.o. female presenting with abdominal pain and back pain. The history is provided by the patient. No language interpreter was used.  Abdominal Pain Pain location:  Epigastric Pain radiates to:  Does not radiate Pain severity:  Moderate Onset quality:  Sudden Duration:  2 hours Timing:  Constant Progression:  Unchanged Chronicity:  New Relieved by:  None tried Worsened by:  Nothing tried Ineffective treatments:  None tried Associated symptoms: no chills, no fever, no nausea and no vomiting   Back Pain Location:  Thoracic spine Radiates to:  Does not radiate Pain severity:  Moderate Pain is:  Same all the time Onset quality:  Sudden Duration:  2 hours Timing:  Constant Progression:  Unchanged Chronicity:  Recurrent Relieved by:  None tried Worsened by:  Nothing tried Ineffective treatments:  None tried Associated symptoms: abdominal pain   Associated symptoms: no bladder incontinence, no bowel incontinence, no fever, no numbness and no weakness      HPI Comments: Grover Zook is a 69 y.o. female, with history of CAD, HTN, and arthritis, who presents to the Emergency Department complaining of sudden mid back pain and upper abdominal pain that began 1.5 hours ago. She denies any injury, falls, trauma, or unfamiliar foods. Patient was seen here 4 days ago for the same back pain where x-rays were taken which revealed spondylolisthesis at the L5-S1. She was prescribed medications and told to follow up with PCP. She states the pain had resolved since her last visit and reports that she was pain-free unitl 1.5-2 hours ago.  Patient is established with a PCP but has not  followed up since her last visit to the ED. She denies nausea or vomiting. Patient denies history of pancreatitis.  PCP: Olivia Mackie  Past Medical History  Diagnosis Date  . Carotid arterial disease   . Hypertension   . High cholesterol   . Arthritis    Past Surgical History  Procedure Laterality Date  . Coronary angioplasty with stent placement     No family history on file. History  Substance Use Topics  . Smoking status: Never Smoker   . Smokeless tobacco: Never Used  . Alcohol Use: No   OB History    No data available     Review of Systems  Constitutional: Negative for fever and chills.  Gastrointestinal: Positive for abdominal pain. Negative for nausea, vomiting and bowel incontinence.  Genitourinary: Negative for bladder incontinence.  Musculoskeletal: Positive for back pain.  Neurological: Negative for weakness and numbness.  All other systems reviewed and are negative.     Allergies  Review of patient's allergies indicates no known allergies.  Home Medications   Prior to Admission medications   Medication Sig Start Date End Date Taking? Authorizing Provider  aspirin 81 MG tablet Take 81 mg by mouth at bedtime.     Historical Provider, MD  atorvastatin (LIPITOR) 80 MG tablet Take 80 mg by mouth daily at 6 PM.     Historical Provider, MD  cephALEXin (KEFLEX) 500 MG capsule Take 1 capsule (500 mg total) by mouth 4 (four) times daily. 06/10/14   Shelby Anderle  Denny Levy, MD  cetirizine (ZYRTEC) 10 MG tablet Take 10 mg by mouth at bedtime.    Historical Provider, MD  clopidogrel (PLAVIX) 75 MG tablet Take 75 mg by mouth daily.    Historical Provider, MD  ezetimibe (ZETIA) 10 MG tablet Take 10 mg by mouth at bedtime.     Historical Provider, MD  fosinopril (MONOPRIL) 20 MG tablet Take 20 mg by mouth daily.    Historical Provider, MD  fosinopril (MONOPRIL) 40 MG tablet Take 40 mg by mouth daily.    Historical Provider, MD  GABAPENTIN, PHN, PO Take by mouth.    Historical  Provider, MD  HYDROcodone-acetaminophen (NORCO/VICODIN) 5-325 MG per tablet Take 1 tablet by mouth every 6 (six) hours as needed for moderate pain or severe pain. 02/02/14   Shon Baton, MD  isosorbide mononitrate (IMDUR) 30 MG 24 hr tablet Take 30 mg by mouth daily.    Historical Provider, MD  metoprolol succinate (TOPROL-XL) 50 MG 24 hr tablet Take 50 mg by mouth daily. Take with or immediately following a meal.    Historical Provider, MD  predniSONE (DELTASONE) 20 MG tablet 3 tabs po day one, then 2 tabs daily x 2 days, then one tablet daily for 5 days, then 1/2 tablet daily for 5 days 12/24/13   Linwood Dibbles, MD  traMADol (ULTRAM) 50 MG tablet Take 1 tablet (50 mg total) by mouth every 6 (six) hours as needed. 12/24/13   Linwood Dibbles, MD   Triage Vitals: BP 98/60 mmHg  Pulse 97  Temp(Src) 98.6 F (37 C) (Oral)  Resp 20  Ht 5\' 8"  (1.727 m)  Wt 165 lb (74.844 kg)  BMI 25.09 kg/m2  SpO2 98%  Physical Exam  Constitutional: She is oriented to person, place, and time. She appears well-developed and well-nourished.  HENT:  Head: Normocephalic and atraumatic.  Right Ear: External ear normal.  Left Ear: External ear normal.  Nose: Nose normal.  Mouth/Throat: Oropharynx is clear and moist.  Eyes: Conjunctivae and EOM are normal. Pupils are equal, round, and reactive to light.  Neck: Normal range of motion. Neck supple. No JVD present. No tracheal deviation present. No thyromegaly present.  Cardiovascular: Normal rate, regular rhythm, normal heart sounds and intact distal pulses.   Pulmonary/Chest: Effort normal and breath sounds normal. She has no wheezes.  Abdominal: Soft. Bowel sounds are normal. She exhibits no mass. There is tenderness in the epigastric area. There is no guarding.  Musculoskeletal: Normal range of motion.  Back is normal.  Lymphadenopathy:    She has no cervical adenopathy.  Neurological: She is alert and oriented to person, place, and time. She has normal reflexes. No  cranial nerve deficit or sensory deficit. Gait normal. GCS eye subscore is 4. GCS verbal subscore is 5. GCS motor subscore is 6.  Reflex Scores:      Bicep reflexes are 2+ on the right side and 2+ on the left side.      Patellar reflexes are 2+ on the right side and 2+ on the left side. Strength is normal and equal throughout. Cranial nerves grossly intact. Patient fluent. No gross ataxia and patient able to ambulate without difficulty.  Skin: Skin is warm and dry.  Psychiatric: She has a normal mood and affect. Her behavior is normal. Judgment and thought content normal.  Nursing note and vitals reviewed.   ED Course  Procedures (including critical care time)  DIAGNOSTIC STUDIES: Oxygen Saturation is 98% on room air, normal by my  interpretation.    COORDINATION OF CARE: At 1946 Discussed treatment plan with patient which includes CT scan of abdomen and labs. Patient agrees.   At 2219 Discussed with patient the results of the CT scan and advised to f/u with specialist and PCP. Patient agrees.  Labs Review Labs Reviewed  URINALYSIS, ROUTINE W REFLEX MICROSCOPIC - Abnormal; Notable for the following:    Leukocytes, UA SMALL (*)    All other components within normal limits  CBC WITH DIFFERENTIAL - Abnormal; Notable for the following:    Hemoglobin 11.4 (*)    HCT 35.1 (*)    All other components within normal limits  COMPREHENSIVE METABOLIC PANEL - Abnormal; Notable for the following:    Sodium 131 (*)    Potassium 3.2 (*)    Glucose, Bld 105 (*)    Albumin 3.3 (*)    AST 40 (*)    GFR calc non Af Amer 61 (*)    GFR calc Af Amer 71 (*)    All other components within normal limits  URINE MICROSCOPIC-ADD ON - Abnormal; Notable for the following:    Squamous Epithelial / LPF FEW (*)    All other components within normal limits  LIPASE, BLOOD    Imaging Review Ct Abdomen Pelvis W Contrast  06/14/2014   CLINICAL DATA:  Lower back and abdominal pain.  EXAM: CT ABDOMEN AND  PELVIS WITH CONTRAST  TECHNIQUE: Multidetector CT imaging of the abdomen and pelvis was performed using the standard protocol following bolus administration of intravenous contrast.  CONTRAST:  71mL OMNIPAQUE IOHEXOL 300 MG/ML  SOLN  COMPARISON:  There are ground-glass opacities within the right and left lower lobes with mild dependent atelectasis. Coronary artery calcifications are seen. There is a small hiatal hernia. There is atherosclerosis of the descending thoracic aorta which is tortuous.  There is mild focal fatty infiltration adjacent to the falciform ligament. The liver is otherwise normal. Gallbladder is physiologically distended. The spleen, and adrenal glands are normal. There is pancreatic atrophy without peripancreatic inflammatory change or ductal dilatation.  There is asymmetry in renal size with right smaller than left. There are right and left renal artery stents in place. 14 mm cyst in the upper left kidney. No hydronephrosis. No perinephric stranding.  No small bowel distention. The cecum and ascending colon are fluid-filled but without colonic wall thickening or perienteric inflammatory change. The appendix is normal. Small volume of stool throughout the descending colon.  The abdominal aorta is normal in caliber with aorto bi-iliac stents in place. There is dense atherosclerosis of the abdominal aorta and its branches.  The urinary bladder is physiologically distended. The uterus and adnexa are normal for age. There is no pelvic free fluid.  There is multilevel facet arthropathy in the lumbar spine. Grade 1 anterolisthesis of L5 on S1. There is hemi transitional lumbosacral anatomy. 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  FINDINGS: 1. Fluid-filled ascending colon without perienteric inflammatory change. This is nonspecific, however may reflect minimal colitis versus related tube diarrhea. 2. Diffuse atherosclerosis. Asymmetry in renal size, right smaller  than left, this appears chronic, with renal artery stents in place. Iliac artery stents in place. 3. Facet arthropathy in the lower lumbar spine with anterolisthesis of L5 on S1. 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 This could be confirmed with nonemergent MRI of the hips.   Electronically Signed   By: Rubye Oaks M.D.   On: 06/14/2014 22:04  MDM   Final diagnoses:  Midline low back pain without sciatica  Epigastric pain  Hypokalemia    This is a 69 year old female who has presented twice in the past 2 weeks with back pain. Today she also complains of some pain in her mid back. On her exam she had some tenderness to palpation in the epigastric area. She has had no complaints of vomiting or diarrhea. CT is equivocal for some possible. Enteric inflammatory changes in her ascending colon which could reflect some minimal colitis. She does not have any other symptoms of colitis such as diarrhea or pain with eating. I have advised her of these findings and advised her to follow-up with her primary care physician.   I personally performed the services described in this documentation, which was scribed in my presence. The recorded information has been reviewed and considered.   Hilario Quarry, MD 06/18/14 1352

## 2014-06-25 ENCOUNTER — Other Ambulatory Visit (HOSPITAL_BASED_OUTPATIENT_CLINIC_OR_DEPARTMENT_OTHER): Payer: Self-pay | Admitting: Internal Medicine

## 2014-06-25 DIAGNOSIS — Z1231 Encounter for screening mammogram for malignant neoplasm of breast: Secondary | ICD-10-CM

## 2014-07-26 ENCOUNTER — Encounter (HOSPITAL_BASED_OUTPATIENT_CLINIC_OR_DEPARTMENT_OTHER): Payer: Self-pay | Admitting: Emergency Medicine

## 2014-07-26 ENCOUNTER — Emergency Department (HOSPITAL_BASED_OUTPATIENT_CLINIC_OR_DEPARTMENT_OTHER)
Admission: EM | Admit: 2014-07-26 | Discharge: 2014-07-26 | Disposition: A | Discharge status: Home / Self Care | Payer: Medicare Other | Attending: Emergency Medicine | Admitting: Emergency Medicine

## 2014-07-26 DIAGNOSIS — I10 Essential (primary) hypertension: Secondary | ICD-10-CM | POA: Diagnosis not present

## 2014-07-26 DIAGNOSIS — Z8639 Personal history of other endocrine, nutritional and metabolic disease: Secondary | ICD-10-CM | POA: Diagnosis not present

## 2014-07-26 DIAGNOSIS — Z79899 Other long term (current) drug therapy: Secondary | ICD-10-CM | POA: Diagnosis not present

## 2014-07-26 DIAGNOSIS — Z9861 Coronary angioplasty status: Secondary | ICD-10-CM | POA: Diagnosis not present

## 2014-07-26 DIAGNOSIS — Z7982 Long term (current) use of aspirin: Secondary | ICD-10-CM | POA: Diagnosis not present

## 2014-07-26 DIAGNOSIS — M069 Rheumatoid arthritis, unspecified: Secondary | ICD-10-CM | POA: Diagnosis not present

## 2014-07-26 DIAGNOSIS — Z7952 Long term (current) use of systemic steroids: Secondary | ICD-10-CM | POA: Diagnosis not present

## 2014-07-26 DIAGNOSIS — Z792 Long term (current) use of antibiotics: Secondary | ICD-10-CM | POA: Diagnosis not present

## 2014-07-26 DIAGNOSIS — M25552 Pain in left hip: Secondary | ICD-10-CM | POA: Diagnosis not present

## 2014-07-26 DIAGNOSIS — Z7902 Long term (current) use of antithrombotics/antiplatelets: Secondary | ICD-10-CM | POA: Diagnosis not present

## 2014-07-26 DIAGNOSIS — M2550 Pain in unspecified joint: Secondary | ICD-10-CM

## 2014-07-26 DIAGNOSIS — M25559 Pain in unspecified hip: Secondary | ICD-10-CM | POA: Diagnosis present

## 2014-07-26 HISTORY — DX: Rheumatoid arthritis, unspecified: M06.9

## 2014-07-26 MED ORDER — HYDROMORPHONE HCL 1 MG/ML IJ SOLN
1.0000 mg | Freq: Once | INTRAMUSCULAR | Status: AC
  Administered 2014-07-26: 1 mg via INTRAMUSCULAR
  Filled 2014-07-26: qty 1

## 2014-07-26 MED ORDER — HYDROCODONE-ACETAMINOPHEN 5-325 MG PO TABS: 1.0000 | ORAL_TABLET | ORAL | Status: DC | PRN

## 2014-07-26 MED ORDER — ONDANSETRON 8 MG PO TBDP
8.0000 mg | ORAL_TABLET | Freq: Once | ORAL | Status: AC
  Administered 2014-07-26: 8 mg via ORAL
  Filled 2014-07-26: qty 1

## 2014-07-26 NOTE — ED Provider Notes (Signed)
CSN: [id number]     Arrival date & time 07/26/14  9417 History   First MD Initiated Contact with Patient 07/26/14 [phone]     No chief complaint on file.    (Consider location/radiation/quality/duration/timing/severity/associated sxs/prior Treatment) HPI Comments: Patient presents to the ER for evaluation of hip pain. Patient reports that her pain began approximately a week ago. It has been progressively worsening. She denies injury. She has a history of rheumatoid arthritis and this feels similar to problems she has had previously and the hip. Her rheumatologist just called in a prescription for prednisone which she just started today, but she is having trouble walking because of the increased pain. She does not currently have any pain medication at home.   Past Medical History  Diagnosis Date  . Carotid arterial disease   . Hypertension   . High cholesterol   . Arthritis    Past Surgical History  Procedure Laterality Date  . Coronary angioplasty with stent placement     No family history on file. History  Substance Use Topics  . Smoking status: Never Smoker   . Smokeless tobacco: Never Used  . Alcohol Use: No   OB History    No data available     Review of Systems  Constitutional: Negative for fever.  Musculoskeletal: Positive for arthralgias.  All other systems reviewed and are negative.     Allergies  Review of patient's allergies indicates no known allergies.  Home Medications   Prior to Admission medications   Medication Sig Start Date End Date Taking? Authorizing Provider  aspirin 81 MG tablet Take 81 mg by mouth at bedtime.     Historical Provider, MD  atorvastatin (LIPITOR) 80 MG tablet Take 80 mg by mouth daily at 6 PM.     Historical Provider, MD  cephALEXin (KEFLEX) 500 MG capsule Take 1 capsule (500 mg total) by mouth 4 (four) times daily. 06/10/14   Hilario Quarry, MD  cetirizine (ZYRTEC) 10 MG tablet Take 10 mg by mouth at bedtime.    Historical Provider,  MD  clopidogrel (PLAVIX) 75 MG tablet Take 75 mg by mouth daily.    Historical Provider, MD  ezetimibe (ZETIA) 10 MG tablet Take 10 mg by mouth at bedtime.     Historical Provider, MD  fosinopril (MONOPRIL) 20 MG tablet Take 20 mg by mouth daily.    Historical Provider, MD  fosinopril (MONOPRIL) 40 MG tablet Take 40 mg by mouth daily.    Historical Provider, MD  GABAPENTIN, PHN, PO Take by mouth.    Historical Provider, MD  HYDROcodone-acetaminophen (NORCO/VICODIN) 5-325 MG per tablet Take 1 tablet by mouth every 6 (six) hours as needed for moderate pain or severe pain. 02/02/14   Shon Baton, MD  isosorbide mononitrate (IMDUR) 30 MG 24 hr tablet Take 30 mg by mouth daily.    Historical Provider, MD  metoprolol succinate (TOPROL-XL) 50 MG 24 hr tablet Take 50 mg by mouth daily. Take with or immediately following a meal.    Historical Provider, MD  predniSONE (DELTASONE) 20 MG tablet 3 tabs po day one, then 2 tabs daily x 2 days, then one tablet daily for 5 days, then 1/2 tablet daily for 5 days 12/24/13   Linwood Dibbles, MD  traMADol (ULTRAM) 50 MG tablet Take 1 tablet (50 mg total) by mouth every 6 (six) hours as needed. 12/24/13   Linwood Dibbles, MD   BP 180/70 mmHg  Pulse 72  Temp(Src) 98.4 F (36.9 C) (  Oral)  Resp 18  SpO2 98% Physical Exam  Constitutional: She is oriented to person, place, and time. She appears well-developed and well-nourished. No distress.  HENT:  Head: Normocephalic and atraumatic.  Right Ear: Hearing normal.  Left Ear: Hearing normal.  Nose: Nose normal.  Mouth/Throat: Oropharynx is clear and moist and mucous membranes are normal.  Eyes: Conjunctivae and EOM are normal. Pupils are equal, round, and reactive to light.  Neck: Normal range of motion. Neck supple.  Cardiovascular: Regular rhythm, S1 normal and S2 normal.  Exam reveals no gallop and no friction rub.   No murmur heard. Pulmonary/Chest: Effort normal and breath sounds normal. No respiratory distress. She  exhibits no tenderness.  Abdominal: Soft. Normal appearance and bowel sounds are normal. There is no hepatosplenomegaly. There is no tenderness. There is no rebound, no guarding, no tenderness at McBurney's point and negative Murphy's sign. No hernia.  Musculoskeletal:       Left hip: She exhibits decreased range of motion and tenderness. She exhibits no swelling.  No warmth or erythema of left hip  Neurological: She is alert and oriented to person, place, and time. She has normal strength. No cranial nerve deficit or sensory deficit. Coordination normal. GCS eye subscore is 4. GCS verbal subscore is 5. GCS motor subscore is 6.  Skin: Skin is warm, dry and intact. No rash noted. No cyanosis.  Psychiatric: She has a normal mood and affect. Her speech is normal and behavior is normal. Thought content normal.  Nursing note and vitals reviewed.   ED Course  Procedures (including critical care time) Labs Review Labs Reviewed - No data to display  Imaging Review No results found.   EKG Interpretation None      MDM   Final diagnoses:  None   arthralgia  Rheumatoid arthritis  Patient presents to the ER for evaluation of increased pain in the left hip consistent with rheumatoid arthritis. No signs of infection. Patient is on methotrexate for her RA, but does not take any chronic pain meds. She was just initiated on prednisone which is appropriate, but will require analgesia. Patient administered IM Dilaudid here in the ER and will be prescribed hydrocodone.    Gilda Crease, MD 07/26/14 [phone]

## 2014-07-26 NOTE — Discharge Instructions (Signed)
Arthralgia Arthralgia is joint pain. A joint is a place where two bones meet. Joint pain can happen for many reasons. The joint can be bruised, stiff, infected, or weak from aging. Pain usually goes away after resting and taking medicine for soreness.  HOME CARE  Rest the joint as told by your doctor.  Keep the sore joint raised (elevated) for the first 24 hours.  Put ice on the joint area.  Put ice in a plastic bag.  Place a towel between your skin and the bag.  Leave the ice on for 15-20 minutes, 03-04 times a day.  Wear your splint, casting, elastic bandage, or sling as told by your doctor.  Only take medicine as told by your doctor. Do not take aspirin.  Use crutches as told by your doctor. Do not put weight on the joint until told to by your doctor. GET HELP RIGHT AWAY IF:   You have bruising, puffiness (swelling), or more pain.  Your fingers or toes turn blue or start to lose feeling (numb).  Your medicine does not lessen the pain.  Your pain becomes severe.  You have a temperature by mouth above 102 F (38.9 C), not controlled by medicine.  You cannot move or use the joint. MAKE SURE YOU:   Understand these instructions.  Will watch your condition.  Will get help right away if you are not doing well or get worse. Document Released: 05/06/2009 Document Revised: 08/10/2011 Document Reviewed: 05/06/2009 [facility] Patient Information 2015 [place], [place]. This information is not intended to replace advice given to you by your health care provider. Make sure you discuss any questions you have with your health care provider.  Rheumatoid Arthritis Rheumatoid arthritis is a disease that causes pain, puffiness (swelling), and stiffness of the joints. It is a long-term (chronic) disease. It can affect the whole body, even the eyes and lungs.  HOME CARE  Stay active, but lessen activity when the disease gets worse.  Eat healthy foods.  Put heat on the affected joints  when you wake up and before activity. Keep the heat on for as long as told by your doctor.  Put ice on the affected joints after activity or exercise.  Put ice in a plastic bag.  Place a towel between your skin and the bag.  Leave the ice on for 15-20 minutes, 03-04 times a day.  Take all medicine and other dietary pills (supplements) as told by your doctor.  Use a splint as told by your doctor. Splints help keep joints in a certain position to keep the joint working right.  Do not sleep with pillows under your knees.  Go to programs that can keep you updated on treatments and ways to deal with your disease. GET HELP RIGHT AWAY IF:  You have times where you pass out (faint).  You have times where you are really weak.  You suddenly have a hot, painful joint that feels worse than your normal joint ache.  You have chills.  You have a fever. Document Released: 08/10/2011 Document Revised: 10/02/2013 Document Reviewed: 08/10/2011 [facility] Patient Information 2015 [place], [place]. This information is not intended to replace advice given to you by your health care provider. Make sure you discuss any questions you have with your health care provider. Hypertension Hypertension, commonly called high blood pressure, is when the force of blood pumping through your arteries is too strong. Your arteries are the blood vessels that carry blood from your heart throughout your body. A  blood pressure reading consists of a higher number over a lower number, such as 110/72. The higher number (systolic) is the pressure inside your arteries when your heart pumps. The lower number (diastolic) is the pressure inside your arteries when your heart relaxes. Ideally you want your blood pressure below 120/80. Hypertension forces your heart to work harder to pump blood. Your arteries may become narrow or stiff. Having hypertension puts you at risk for heart disease, stroke, and other problems.  RISK FACTORS Some  risk factors for high blood pressure are controllable. Others are not.  Risk factors you cannot control include:   Race. You may be at higher risk if you are African American.  Age. Risk increases with age.  Gender. Men are at higher risk than women before age 38 years. After age 18, women are at higher risk than men. Risk factors you can control include:  Not getting enough exercise or physical activity.  Being overweight.  Getting too much fat, sugar, calories, or salt in your diet.  Drinking too much alcohol. SIGNS AND SYMPTOMS Hypertension does not usually cause signs or symptoms. Extremely high blood pressure (hypertensive crisis) may cause headache, anxiety, shortness of breath, and nosebleed. DIAGNOSIS  To check if you have hypertension, your health care provider will measure your blood pressure while you are seated, with your arm held at the level of your heart. It should be measured at least twice using the same arm. Certain conditions can cause a difference in blood pressure between your right and left arms. A blood pressure reading that is higher than normal on one occasion does not mean that you need treatment. If one blood pressure reading is high, ask your health care provider about having it checked again. TREATMENT  Treating high blood pressure includes making lifestyle changes and possibly taking medicine. Living a healthy lifestyle can help lower high blood pressure. You may need to change some of your habits. Lifestyle changes may include:  Following the DASH diet. This diet is high in fruits, vegetables, and whole grains. It is low in salt, red meat, and added sugars.  Getting at least 2 hours of brisk physical activity every week.  Losing weight if necessary.  Not smoking.  Limiting alcoholic beverages.  Learning ways to reduce stress. If lifestyle changes are not enough to get your blood pressure under control, your health care provider may prescribe  medicine. You may need to take more than one. Work closely with your health care provider to understand the risks and benefits. HOME CARE INSTRUCTIONS  Have your blood pressure rechecked as directed by your health care provider.   Take medicines only as directed by your health care provider. Follow the directions carefully. Blood pressure medicines must be taken as prescribed. The medicine does not work as well when you skip doses. Skipping doses also puts you at risk for problems.   Do not smoke.   Monitor your blood pressure at home as directed by your health care provider. SEEK MEDICAL CARE IF:   You think you are having a reaction to medicines taken.  You have recurrent headaches or feel dizzy.  You have swelling in your ankles.  You have trouble with your vision. SEEK IMMEDIATE MEDICAL CARE IF:  You develop a severe headache or confusion.  You have unusual weakness, numbness, or feel faint.  You have severe chest or abdominal pain.  You vomit repeatedly.  You have trouble breathing. MAKE SURE YOU:   Understand these instructions.  Will watch your condition.  Will get help right away if you are not doing well or get worse. Document Released: 05/18/2005 Document Revised: 10/02/2013 Document Reviewed: 03/10/2013 [facility] Patient Information 2015 [place], [place]. This information is not intended to replace advice given to you by your health care provider. Make sure you discuss any questions you have with your health care provider.

## 2014-07-26 NOTE — ED Notes (Signed)
Left hip pain for over one week.  Worse this am.  Denies injury.

## 2014-07-27 ENCOUNTER — Ambulatory Visit (HOSPITAL_BASED_OUTPATIENT_CLINIC_OR_DEPARTMENT_OTHER): Payer: Medicare Other

## 2014-07-30 ENCOUNTER — Other Ambulatory Visit: Payer: Self-pay | Admitting: Internal Medicine

## 2014-07-30 DIAGNOSIS — M25552 Pain in left hip: Secondary | ICD-10-CM

## 2014-08-03 DIAGNOSIS — I209 Angina pectoris, unspecified: Secondary | ICD-10-CM | POA: Insufficient documentation

## 2014-08-03 DIAGNOSIS — M8700 Idiopathic aseptic necrosis of unspecified bone: Secondary | ICD-10-CM | POA: Insufficient documentation

## 2014-08-03 DIAGNOSIS — M5430 Sciatica, unspecified side: Secondary | ICD-10-CM | POA: Insufficient documentation

## 2014-08-07 ENCOUNTER — Ambulatory Visit (INDEPENDENT_AMBULATORY_CARE_PROVIDER_SITE_OTHER): Payer: Medicare Other | Admitting: Neurology

## 2014-08-07 ENCOUNTER — Encounter: Payer: Self-pay | Admitting: Neurology

## 2014-08-07 VITALS — BP 150/90 | HR 64 | Resp 14 | Ht 68.0 in | Wt 163.8 lb

## 2014-08-07 DIAGNOSIS — M5432 Sciatica, left side: Secondary | ICD-10-CM

## 2014-08-07 DIAGNOSIS — M879 Osteonecrosis, unspecified: Secondary | ICD-10-CM | POA: Diagnosis not present

## 2014-08-07 DIAGNOSIS — M069 Rheumatoid arthritis, unspecified: Secondary | ICD-10-CM | POA: Insufficient documentation

## 2014-08-07 DIAGNOSIS — M4317 Spondylolisthesis, lumbosacral region: Secondary | ICD-10-CM

## 2014-08-07 DIAGNOSIS — M8700 Idiopathic aseptic necrosis of unspecified bone: Secondary | ICD-10-CM

## 2014-08-07 DIAGNOSIS — M542 Cervicalgia: Secondary | ICD-10-CM

## 2014-08-07 MED ORDER — GABAPENTIN 300 MG PO CAPS: 300.0000 mg | ORAL_CAPSULE | Freq: Three times a day (TID) | ORAL | Status: DC

## 2014-08-07 NOTE — Progress Notes (Signed)
[facility]  PATIENT: Sara Villanueva DOB: Aug 22, 1945  REFERRING DOCTOR OR PCP:  Olivia Mackie SOURCE: patient  _________________________________   HISTORICAL  CHIEF COMPLAINT:  Chief Complaint  Patient presents with  . Back Pain    Sts. lbp radiating down posterior aspect of left leg to heel is worse over the last 3 weeks.  Denies new injury.  Sts. she had an mri of her lower back at [facility] last week--sts. she was then referred to [employer], [place], with [facility]--and sts. he told her she has sciatica.  Sts. she is currently on 4th day of prednisone dose pk, but so far, no relief of pain./fim    HISTORY OF PRESENT ILLNESS:  Sara Villanueva is a 69 year old who reports back and left hip pain radiating into the posterior aspect of the left leg and into the bottom of the left foot.  She denies leg weakness, numbness or bladder changes.    Pain has been fairly severe.    Pain will increase if she stands with weight on left leg or walks.  Pain is less with sitting.  She took hydrocodone without much benefit.   Ibuprofen did not help.   She had once been on gabapentin but only on a low dose.    She had no trouble tolerating it.   In the past, she has received some benefit from piriformis or lumbar paraspinal trigger points. She has also received benefit from epidural steroid injections when pain was more severe.  I reviewed an x-ray of her back and shows spondylolisthesis at L5-S1. 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    She has never had surgery in her back in the past.     She has rheumatoid arthritis and is on methotrexate. She also has some neck pain but that is bothering her much less currently.  REVIEW OF SYSTEMS: Constitutional: No fevers, chills, sweats, or change in appetite Eyes: No visual changes, double vision, eye pain Ear, nose and throat: No hearing loss, ear pain, nasal congestion, sore throat Cardiovascular: No  chest pain, palpitations Respiratory: No shortness of breath at rest or with exertion.   No wheezes GastrointestinaI: No nausea, vomiting, diarrhea, abdominal pain, fecal incontinence Genitourinary: No dysuria, urinary retention or frequency.  No nocturia. Musculoskeletal: Minimal neck pain; moderate back pain and left hip pain Integumentary: No rash, pruritus, skin lesions Neurological: as above Psychiatric: No depression at this time.  No anxiety Endocrine: No palpitations, diaphoresis, change in appetite, change in weigh or increased thirst Hematologic/Lymphatic: No anemia, purpura, petechiae. Allergic/Immunologic: No itchy/runny eyes, nasal congestion, recent allergic reactions, rashes  ALLERGIES: No Known Allergies  HOME MEDICATIONS:  Current outpatient prescriptions:  .  aspirin 81 MG tablet, Take 81 mg by mouth at bedtime. , Disp: , Rfl:  .  BESIVANCE 0.6 % SUSP, , Disp: , Rfl:  .  busPIRone (BUSPAR) 15 MG tablet, , Disp: , Rfl:  .  cetirizine (ZYRTEC) 10 MG tablet, Take 10 mg by mouth at bedtime., Disp: , Rfl:  .  clopidogrel (PLAVIX) 75 MG tablet, Take 75 mg by mouth daily., Disp: , Rfl:  .  ezetimibe (ZETIA) 10 MG tablet, Take 10 mg by mouth at bedtime. , Disp: , Rfl:  .  folic acid (FOLVITE) 1 MG tablet, Take 1 mg by mouth daily., Disp: , Rfl:  .  fosinopril (MONOPRIL) 40 MG tablet, , Disp: , Rfl:  .  halobetasol (ULTRAVATE) 0.05 %  cream, , Disp: , Rfl:  .  isosorbide mononitrate (IMDUR) 120 MG 24 hr tablet, , Disp: , Rfl:  .  LINZESS 145 MCG CAPS capsule, , Disp: , Rfl:  .  LIVALO 2 MG TABS, , Disp: , Rfl:  .  methotrexate 2.5 MG tablet, Take 2.5 mg by mouth once a week., Disp: , Rfl:  .  methylPREDNIsolone (MEDROL DOSPACK) 4 MG tablet, follow package directions, Disp: , Rfl:  .  metoprolol (LOPRESSOR) 50 MG tablet, , Disp: , Rfl:  .  NITROSTAT 0.4 MG SL tablet, , Disp: , Rfl:  .  predniSONE (DELTASONE) 20 MG tablet, 3 tabs po day one, then 2 tabs daily x 2 days,  then one tablet daily for 5 days, then 1/2 tablet daily for 5 days, Disp: 15 tablet, Rfl: 0  PAST MEDICAL HISTORY: Past Medical History  Diagnosis Date  . Carotid arterial disease   . Hypertension   . High cholesterol   . Arthritis   . Rheumatoid arthritis     PAST SURGICAL HISTORY: Past Surgical History  Procedure Laterality Date  . Coronary angioplasty with stent placement      FAMILY HISTORY: Family History  Problem Relation Age of Onset  . Heart disease Mother   . Heart disease Father     SOCIAL HISTORY:  History   Social History  . Marital Status: Married    Spouse Name: N/A  . Number of Children: N/A  . Years of Education: N/A   Occupational History  . Not on file.   Social History Main Topics  . Smoking status: Former Games developer  . Smokeless tobacco: Never Used  . Alcohol Use: No  . Drug Use: No  . Sexual Activity: Yes    Birth Control/ Protection: Post-menopausal   Other Topics Concern  . Not on file   Social History Narrative     PHYSICAL EXAM  Filed Vitals:   08/07/14 0921  BP: 150/90  Pulse: 64  Resp: 14  Height: 5\' 8"  (1.727 m)  Weight: 163 lb 12.8 oz (74.299 kg)    Body mass index is 24.91 kg/(m^2).   General: The patient is well-developed and well-nourished and in no acute distress  Neck: The neck is supple, no carotid bruits are noted.  The neck is nontender.  Cardiovascular: The heart has a regular rate and rhythm with a normal S1 and S2. There were no murmurs, gallops or rubs. Lungs are clear to auscultation.  Skin: Extremities are without significant edema.  Musculoskeletal:  Back is tender over left piriformis muscle > left paraspinal muscles (L5S1).   Hip with good ROM.   Mildly positive on left  Neurologic Exam  Mental status: The patient is alert and oriented x 3 at the time of the examination. The patient has apparent normal recent and remote memory, with an apparently normal attention span and concentration  ability.   Speech is normal.  Cranial nerves: Extraocular movements are full. Facial symmetry is present..Facial strength is normal.  Trapezius and sternocleidomastoid strength is normal. No dysarthria is noted.    Motor:  Muscle bulk is normal.   Tone is normal. Strength is  5 / 5 in all 4 extremities.   Sensory: Sensory testing is intact to pinprick, soft touch and vibration sensation in all 4 extremities.  Coordination: Cerebellar testing reveals good finger-nose-finger and heel-to-shin bilaterally.  Gait and station: Station is normal.   Gait is arthritic . Tandem gait is mildly wide. Romberg is negative.  Reflexes: Deep tendon reflexes are symmetric and normal bilaterally.       DIAGNOSTIC DATA (LABS, IMAGING, TESTING) - I reviewed patient records, labs, notes, testing and imaging myself where available.  Lab Results  Component Value Date   WBC 6.3 06/14/2014   HGB 11.4* 06/14/2014   HCT 35.1* 06/14/2014   MCV 89.1 06/14/2014   PLT 346 06/14/2014      Component Value Date/Time   NA 131* 06/14/2014 2000   K 3.2* 06/14/2014 2000   CL 100 06/14/2014 2000   CO2 22 06/14/2014 2000   GLUCOSE 105* 06/14/2014 2000   BUN 17 06/14/2014 2000   CREATININE 0.93 06/14/2014 2000   CALCIUM 9.4 06/14/2014 2000   PROT 7.2 06/14/2014 2000   ALBUMIN 3.3* 06/14/2014 2000   AST 40* 06/14/2014 2000   ALT 27 06/14/2014 2000   ALKPHOS 60 06/14/2014 2000   BILITOT 0.5 06/14/2014 2000   GFRNONAA 61* 06/14/2014 2000   GFRAA 71* 06/14/2014 2000       ASSESSMENT AND PLAN  Neuralgia neuritis, sciatic nerve, left  Spondylolisthesis at L5-S1 level  Chronic rheumatic arthritis  Neck pain  Aseptic bony necrosis   In summary, Sara Villanueva is a 69 year old woman with left sided sciatica. She has tenderness over the left piriformis muscle, more so than the left paraspinal or left trochanteric bursa that would be consistent with a piriformis syndrome causing her pain. However, on  examination she has asymmetric reflexes that would be more consistent with a left S1 radiculopathy.x-ray shows anterolisthesis at L5-S1 that could be contributing to take her symptoms there. Because she is on Avonex, she cannot immediately get an epidural steroid injection. I proceeded to do a left piriformis muscle injection and hope that that would eliminate most of her pain. If she is not better by next week, we will have her hold her Plavix and set up an epidural steroid injection at L5-S1. In the interim, I'm also going to start gabapentin as she has a neuropathic element to her pain.   She will call us back if not better or worsening. Otherwise she will follow up in about 4 months for regular visits.   Brynley Cuddeback A. Epimenio Foot, MD, PhD 08/07/2014, 9:34 AM Certified in Neurology, Clinical Neurophysiology, Sleep Medicine, Pain Medicine and Neuroimaging  [facility] [address] [place], [place] [address] [phone]

## 2014-08-08 ENCOUNTER — Telehealth: Payer: Self-pay | Admitting: *Deleted

## 2014-08-08 NOTE — Telephone Encounter (Signed)
Release faxed to [facility] requesting MRI's of brain, cervical, lumbar spine.

## 2014-08-09 ENCOUNTER — Telehealth: Payer: Self-pay | Admitting: *Deleted

## 2014-08-09 NOTE — Telephone Encounter (Signed)
duplicate task/fim 

## 2014-08-09 NOTE — Telephone Encounter (Signed)
Left message with husband at home # for pt. to call/fim

## 2014-08-09 NOTE — Telephone Encounter (Signed)
Patient returning Faith call.

## 2014-08-09 NOTE — Telephone Encounter (Signed)
Patient returned Faith, RN call.

## 2014-08-09 NOTE — Telephone Encounter (Signed)
Patient is calling stating that she is still in pain from last weeks visit. Patient states that she was told she could have an epidural injection. Please call the patient and let her know what to do about her pain

## 2014-08-09 NOTE — Telephone Encounter (Signed)
Attempted to contact Sara Villanueva.  VM not set up/fim

## 2014-08-09 NOTE — Telephone Encounter (Signed)
Spoke with Sara Villanueva.  She just had piriformis inj. on 3-8.  Per RAS ov note, she should give it a week and call back if not better next week.  Advised it can take several days to receive full benefit.   She is on Plavix, so it is in her best interest to avoid an esi if possible--she is agreeable to giving inj. a few more days--will call back early next week if not better./fim

## 2014-08-10 ENCOUNTER — Telehealth: Payer: Self-pay | Admitting: Neurology

## 2014-08-10 DIAGNOSIS — M5416 Radiculopathy, lumbar region: Secondary | ICD-10-CM

## 2014-08-10 NOTE — Telephone Encounter (Signed)
Patient is calling with extreme back pain and would like to come in and get an epidural next week.  Please call.

## 2014-08-16 ENCOUNTER — Encounter: Payer: Self-pay | Admitting: Neurology

## 2014-08-16 ENCOUNTER — Ambulatory Visit (INDEPENDENT_AMBULATORY_CARE_PROVIDER_SITE_OTHER): Payer: Medicare Other | Admitting: Neurology

## 2014-08-16 VITALS — BP 156/78 | HR 62 | Resp 14 | Ht 68.0 in | Wt 162.2 lb

## 2014-08-16 DIAGNOSIS — M5416 Radiculopathy, lumbar region: Secondary | ICD-10-CM | POA: Insufficient documentation

## 2014-08-16 DIAGNOSIS — M5417 Radiculopathy, lumbosacral region: Secondary | ICD-10-CM | POA: Diagnosis not present

## 2014-08-16 MED ORDER — TRAMADOL HCL 50 MG PO TABS: 50.0000 mg | ORAL_TABLET | Freq: Three times a day (TID) | ORAL | Status: DC | PRN

## 2014-08-16 MED ORDER — GABAPENTIN 600 MG PO TABS: 600.0000 mg | ORAL_TABLET | Freq: Three times a day (TID) | ORAL | Status: DC

## 2014-08-16 NOTE — Progress Notes (Signed)
[facility]  PATIENT: Sara Villanueva DOB: Jul 09, 1945  REFERRING DOCTOR OR PCP:  Olivia Mackie SOURCE: patient  _________________________________   HISTORICAL  CHIEF COMPLAINT:  Chief Complaint  Patient presents with  . Back Pain    Sts. back pain is no better.  Here today for an esi.  She last took Plavix on 08-11-14--has not had any since 08-12-14/fim    HISTORY OF PRESENT ILLNESS:  Sara Villanueva is a 69 year old with back and left hip pain radiating into the posterior aspect of the left leg and into the bottom of the left foot.  She denies leg weakness, numbness or bladder changes.   I reviewed an x-ray of her back and shows spondylolisthesis at L5-S1. 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    She has never had surgery in her back in the past.      ALLERGIES: No Known Allergies  HOME MEDICATIONS:  Current outpatient prescriptions:  .  aspirin 81 MG tablet, Take 81 mg by mouth at bedtime. , Disp: , Rfl:  .  BESIVANCE 0.6 % SUSP, , Disp: , Rfl:  .  busPIRone (BUSPAR) 15 MG tablet, , Disp: , Rfl:  .  cetirizine (ZYRTEC) 10 MG tablet, Take 10 mg by mouth at bedtime., Disp: , Rfl:  .  clopidogrel (PLAVIX) 75 MG tablet, Take 75 mg by mouth daily., Disp: , Rfl:  .  ezetimibe (ZETIA) 10 MG tablet, Take 10 mg by mouth at bedtime. , Disp: , Rfl:  .  folic acid (FOLVITE) 1 MG tablet, Take 1 mg by mouth daily., Disp: , Rfl:  .  fosinopril (MONOPRIL) 40 MG tablet, , Disp: , Rfl:  .  gabapentin (NEURONTIN) 300 MG capsule, Take 1 capsule (300 mg total) by mouth 3 (three) times daily., Disp: 90 capsule, Rfl: 11 .  halobetasol (ULTRAVATE) 0.05 % cream, , Disp: , Rfl:  .  isosorbide mononitrate (IMDUR) 120 MG 24 hr tablet, , Disp: , Rfl:  .  LINZESS 145 MCG CAPS capsule, , Disp: , Rfl:  .  LIVALO 2 MG TABS, , Disp: , Rfl:  .  methotrexate 2.5 MG tablet, Take 2.5 mg by mouth once a week., Disp: , Rfl:  .  methylPREDNIsolone (MEDROL DOSPACK) 4 MG  tablet, follow package directions, Disp: , Rfl:  .  metoprolol (LOPRESSOR) 50 MG tablet, , Disp: , Rfl:  .  NITROSTAT 0.4 MG SL tablet, , Disp: , Rfl:  .  predniSONE (DELTASONE) 20 MG tablet, 3 tabs po day one, then 2 tabs daily x 2 days, then one tablet daily for 5 days, then 1/2 tablet daily for 5 days, Disp: 15 tablet, Rfl: 0  PAST MEDICAL HISTORY: Past Medical History  Diagnosis Date  . Carotid arterial disease   . Hypertension   . High cholesterol   . Arthritis   . Rheumatoid arthritis     PAST SURGICAL HISTORY: Past Surgical History  Procedure Laterality Date  . Coronary angioplasty with stent placement      FAMILY HISTORY: Family History  Problem Relation Age of Onset  . Heart disease Mother   . Heart disease Father     SOCIAL HISTORY:  History   Social History  . Marital Status: Married    Spouse Name: N/A  . Number of Children: N/A  . Years of Education: N/A   Occupational History  . Not on file.   Social History Main Topics  . Smoking status: Former  Smoker  . Smokeless tobacco: Never Used  . Alcohol Use: No  . Drug Use: No  . Sexual Activity: Yes    Birth Control/ Protection: Post-menopausal   Other Topics Concern  . Not on file   Social History Narrative     PHYSICAL EXAM  Filed Vitals:   08/16/14 0931  BP: 156/78  Pulse: 62  Resp: 14  Height: 5\' 8"  (1.727 m)  Weight: 162 lb 3.2 oz (73.573 kg)    Body mass index is 24.67 kg/(m^2).   General: The patient is well-developed and well-nourished and in no acute distress  Musculoskeletal:  Back is tender over left piriformis muscle > left paraspinal muscles (L5S1).   Hip with good ROM.   Mildly positive on left  Neurologic Exam  Motor:  Muscle bulk is normal.   Tone is normal. Strength is  5 / 5 in all 4 extremities.   Sensory: Sensory testing is intact to pinprick, soft touch and vibration sensation in all 4 extremities.  Gait and station: Station is normal.   Gait is  arthritic .      ASSESSMENT AND PLAN  Lumbosacral radiculopathy   Epidural Steroid Injection at L4L5  The risks, alternatives and benefits of an epidural steroid injection was discussed with her. The back was prepped and draped. The skin over the L4L5 interspace was numbed with 1% lidocaine. An 18-gauge Touhy needle was slowly advanced. Using the loss of resistance technique with sterile saline, the epidural space was identified.  80 Milligrams of Depo-Medrol was mixed with 2 mL normal saline and was slowly injected. The needle was removed intact. The patient tolerated the procedure well.   1.    L4L5 translaminar ESI 2.    Increase gabapentin to 600 mg po tid 3.    add tamadol    Florencio Hollibaugh A. Pearlean Brownie, MD, PhD 08/16/2014, 9:55 AM Certified in Neurology, Clinical Neurophysiology, Sleep Medicine, Pain Medicine and Neuroimaging  [facility] [address], [address] [place], [place] [phone]

## 2014-08-21 ENCOUNTER — Telehealth: Payer: Self-pay | Admitting: Neurology

## 2014-08-21 DIAGNOSIS — M5417 Radiculopathy, lumbosacral region: Secondary | ICD-10-CM

## 2014-08-21 NOTE — Telephone Encounter (Signed)
Patient stated epidural injections received on 3/17 has not been beneficial.  Questioning what would be the next step?  Please call and advise.

## 2014-08-21 NOTE — Telephone Encounter (Signed)
Spoke with Mrs. Sara Villanueva who sts. pain is no better.  Per RAS, I advised her she should give steroids another week to realize their full benefit.    If not better by early next week, she should call back and will order a tfesi at that time.  She verbalized understanding of same/fim **She is on Plavix/fim

## 2014-08-29 ENCOUNTER — Other Ambulatory Visit (HOSPITAL_BASED_OUTPATIENT_CLINIC_OR_DEPARTMENT_OTHER): Payer: Self-pay | Admitting: Internal Medicine

## 2014-08-29 DIAGNOSIS — Z1231 Encounter for screening mammogram for malignant neoplasm of breast: Secondary | ICD-10-CM

## 2014-08-29 NOTE — Telephone Encounter (Signed)
Patient returning call, will be home until 10:30.  Can call on cell # [phone] if after 10:30.

## 2014-08-29 NOTE — Telephone Encounter (Signed)
LMOM (home #) to call--will see how much pain she is still having/fim

## 2014-08-29 NOTE — Telephone Encounter (Signed)
Patient stated she's still experiencing pain and would like to proceed with tfesi.  If possible would like done in [facility].  Please call and advise.

## 2014-08-29 NOTE — Telephone Encounter (Signed)
Spoke with Scarlette Calico who sts. she received only minimal relief from tlesi.  Sts. pain still averaging an 8 on 1-10 pain scale.  Per RAS, L4-5 tfesi ordered--to be done at [facility] per pt's preference. (She's had esi's there before.)/fim

## 2014-09-03 ENCOUNTER — Ambulatory Visit (HOSPITAL_BASED_OUTPATIENT_CLINIC_OR_DEPARTMENT_OTHER)
Admission: RE | Admit: 2014-09-03 | Discharge: 2014-09-03 | Disposition: A | Discharge status: Home / Self Care | Payer: Medicare Other | Source: Ambulatory Visit | Attending: Internal Medicine | Admitting: Internal Medicine

## 2014-09-03 DIAGNOSIS — Z1231 Encounter for screening mammogram for malignant neoplasm of breast: Secondary | ICD-10-CM | POA: Diagnosis not present

## 2014-09-05 NOTE — Telephone Encounter (Signed)
Spoke with Sara Villanueva who sts. back pain is much better--doesn't need anything at this time--just wanted to say thanks/fim

## 2014-09-05 NOTE — Telephone Encounter (Signed)
Patient is calling because the pain level is better and states that she really appreciates our care and concern. Patient would like a returned call. Thank you.

## 2014-09-20 DIAGNOSIS — H2510 Age-related nuclear cataract, unspecified eye: Secondary | ICD-10-CM | POA: Insufficient documentation

## 2014-11-30 ENCOUNTER — Emergency Department (HOSPITAL_BASED_OUTPATIENT_CLINIC_OR_DEPARTMENT_OTHER)
Admission: EM | Admit: 2014-11-30 | Discharge: 2014-11-30 | Disposition: A | Discharge status: Home / Self Care | Payer: Medicare Other | Attending: Emergency Medicine | Admitting: Emergency Medicine

## 2014-11-30 ENCOUNTER — Encounter (HOSPITAL_BASED_OUTPATIENT_CLINIC_OR_DEPARTMENT_OTHER): Payer: Self-pay | Admitting: Emergency Medicine

## 2014-11-30 ENCOUNTER — Telehealth: Payer: Self-pay | Admitting: Neurology

## 2014-11-30 DIAGNOSIS — Z7902 Long term (current) use of antithrombotics/antiplatelets: Secondary | ICD-10-CM | POA: Insufficient documentation

## 2014-11-30 DIAGNOSIS — I2510 Atherosclerotic heart disease of native coronary artery without angina pectoris: Secondary | ICD-10-CM | POA: Diagnosis not present

## 2014-11-30 DIAGNOSIS — Z7982 Long term (current) use of aspirin: Secondary | ICD-10-CM | POA: Insufficient documentation

## 2014-11-30 DIAGNOSIS — Z7952 Long term (current) use of systemic steroids: Secondary | ICD-10-CM | POA: Diagnosis not present

## 2014-11-30 DIAGNOSIS — Z9861 Coronary angioplasty status: Secondary | ICD-10-CM | POA: Diagnosis not present

## 2014-11-30 DIAGNOSIS — R609 Edema, unspecified: Secondary | ICD-10-CM | POA: Insufficient documentation

## 2014-11-30 DIAGNOSIS — Z79899 Other long term (current) drug therapy: Secondary | ICD-10-CM | POA: Diagnosis not present

## 2014-11-30 DIAGNOSIS — I10 Essential (primary) hypertension: Secondary | ICD-10-CM | POA: Insufficient documentation

## 2014-11-30 DIAGNOSIS — Z8639 Personal history of other endocrine, nutritional and metabolic disease: Secondary | ICD-10-CM | POA: Diagnosis not present

## 2014-11-30 DIAGNOSIS — M1990 Unspecified osteoarthritis, unspecified site: Secondary | ICD-10-CM | POA: Insufficient documentation

## 2014-11-30 DIAGNOSIS — R2243 Localized swelling, mass and lump, lower limb, bilateral: Secondary | ICD-10-CM | POA: Diagnosis present

## 2014-11-30 LAB — CBC WITH DIFFERENTIAL/PLATELET
Basophils Absolute: 0 10*3/uL (ref 0.0–0.1)
Basophils Relative: 0 % (ref 0–1)
EOS ABS: 0.2 10*3/uL (ref 0.0–0.7)
EOS PCT: 5 % (ref 0–5)
HCT: 32.9 % — ABNORMAL LOW (ref 36.0–46.0)
Hemoglobin: 10.3 g/dL — ABNORMAL LOW (ref 12.0–15.0)
LYMPHS ABS: 1.1 10*3/uL (ref 0.7–4.0)
Lymphocytes Relative: 24 % (ref 12–46)
MCH: 29.1 pg (ref 26.0–34.0)
MCHC: 31.3 g/dL (ref 30.0–36.0)
MCV: 92.9 fL (ref 78.0–100.0)
Monocytes Absolute: 0.6 10*3/uL (ref 0.1–1.0)
Monocytes Relative: 12 % (ref 3–12)
NEUTROS PCT: 59 % (ref 43–77)
Neutro Abs: 2.9 10*3/uL (ref 1.7–7.7)
PLATELETS: 248 10*3/uL (ref 150–400)
RBC: 3.54 MIL/uL — ABNORMAL LOW (ref 3.87–5.11)
RDW: 17.2 % — ABNORMAL HIGH (ref 11.5–15.5)
WBC: 4.8 10*3/uL (ref 4.0–10.5)

## 2014-11-30 LAB — BASIC METABOLIC PANEL
Anion gap: 9 (ref 5–15)
BUN: 14 mg/dL (ref 6–20)
CALCIUM: 9 mg/dL (ref 8.9–10.3)
CO2: 22 mmol/L (ref 22–32)
CREATININE: 0.89 mg/dL (ref 0.44–1.00)
Chloride: 109 mmol/L (ref 101–111)
GLUCOSE: 141 mg/dL — AB (ref 65–99)
POTASSIUM: 3.6 mmol/L (ref 3.5–5.1)
Sodium: 140 mmol/L (ref 135–145)

## 2014-11-30 NOTE — Discharge Instructions (Signed)
Peripheral Edema °You have swelling in your legs (peripheral edema). This swelling is due to excess accumulation of salt and water in your body. Edema may be a sign of heart, kidney or liver disease, or a side effect of a medication. It may also be due to problems in the leg veins. Elevating your legs and using special support stockings may be very helpful, if the cause of the swelling is due to poor venous circulation. Avoid long periods of standing, whatever the cause. °Treatment of edema depends on identifying the cause. Chips, pretzels, pickles and other salty foods should be avoided. Restricting salt in your diet is almost always needed. Water pills (diuretics) are often used to remove the excess salt and water from your body via urine. These medicines prevent the kidney from reabsorbing sodium. This increases urine flow. °Diuretic treatment may also result in lowering of potassium levels in your body. Potassium supplements may be needed if you have to use diuretics daily. Daily weights can help you keep track of your progress in clearing your edema. You should call your caregiver for follow up care as recommended. °SEEK IMMEDIATE MEDICAL CARE IF:  °· You have increased swelling, pain, redness, or heat in your legs. °· You develop shortness of breath, especially when lying down. °· You develop chest or abdominal pain, weakness, or fainting. °· You have a fever. °Document Released: 06/25/2004 Document Revised: 08/10/2011 Document Reviewed: 06/05/2009 °Exit[employer]® Patient Information ©2015 [employer]. This information is not intended to replace advice given to you by your health care provider. Make sure you discuss any questions you have with your health care provider. ° °

## 2014-11-30 NOTE — Telephone Encounter (Signed)
Pt called stating she is having leg and feet swelling as well as feet numbness. Questioning if she needed a referral? Would like to come in to be seen about it. Please call and [phone].

## 2014-11-30 NOTE — ED Provider Notes (Signed)
CSN: [id number]     Arrival date & time 11/30/14  1938 History  This chart was scribed for [name], MD by Phillis Haggis, ED Scribe. This patient was seen in room MH10/MH10 and patient care was started at 8:13 PM.   Chief Complaint  Patient presents with  . Leg Swelling   The history is provided by the patient. No language interpreter was used.  HPI Comments: Sara Villanueva is a 69 y.o. female with a history of CAD who presents to the Emergency Department complaining of bilateral leg swelling and tingling in the feet, right worse than left, onset 3 weeks ago. She reports pain with ambulation and tingling in the feet and ankles with doing so. She states that she has seen her cardiologist and other doctors with complete workups done but has not been given a diagnosis for these problems. Reports having a CT scan for her legs and labs done on Wednesday with no significant results. She reports having 2 stents in her heart and RA, for which she takes methotrexate and Plavix. Reports blockages in her neck and reports taking fluid pills intermittently.  She denies a history of clots or heart failure. She denies nausea, vomiting, diarrhea, abdominal pain, sore throat, fever, chills, dizziness, or SOB. 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   Past Medical History  Diagnosis Date  . Carotid arterial disease   . Hypertension   . High cholesterol   . Arthritis   . Rheumatoid arthritis    Past Surgical History  Procedure Laterality Date  . Coronary angioplasty with stent placement     Family History  Problem Relation Age of Onset  . Heart disease Mother   . Heart disease Father    History  Substance Use Topics  . Smoking status: Former Games developer  . Smokeless tobacco: Never Used  . Alcohol Use: No   OB History    No data available     Review of Systems  Constitutional: Negative for fever and chills.  HENT:  Negative for sore throat.   Respiratory: Negative for shortness of breath.   Cardiovascular: Positive for leg swelling.  Gastrointestinal: Negative for nausea, vomiting, abdominal pain and diarrhea.  Musculoskeletal: Positive for arthralgias.  Neurological: Negative for dizziness.       Tingling  All other systems reviewed and are negative.  Allergies  Review of patient's allergies indicates no known allergies.  Home Medications   Prior to Admission medications   Medication Sig Start Date End Date Taking? Authorizing Provider  aspirin 81 MG tablet Take 81 mg by mouth at bedtime.     Historical Provider, MD  BESIVANCE 0.6 % SUSP  07/24/14   Historical Provider, MD  busPIRone (BUSPAR) 15 MG tablet  08/01/14   Historical Provider, MD  cetirizine (ZYRTEC) 10 MG tablet Take 10 mg by mouth at bedtime.    Historical Provider, MD  clopidogrel (PLAVIX) 75 MG tablet Take 75 mg by mouth daily.    Historical Provider, MD  ezetimibe (ZETIA) 10 MG tablet Take 10 mg by mouth at bedtime.     Historical Provider, MD  folic acid (FOLVITE) 1 MG tablet Take 1 mg by mouth daily.    Historical Provider, MD  fosinopril (MONOPRIL) 40 MG tablet  05/14/14   Historical Provider, MD  gabapentin (NEURONTIN) 600 MG tablet Take 1 tablet (600 mg total) by mouth 3 (three) times  daily. 08/16/14   Asa Lente, MD  halobetasol (ULTRAVATE) 0.05 % cream  07/05/14   Historical Provider, MD  isosorbide mononitrate (IMDUR) 120 MG 24 hr tablet  07/21/14   Historical Provider, MD  Karlene Einstein 145 MCG CAPS capsule  07/04/14   Historical Provider, MD  LIVALO 2 MG TABS  07/12/14   Historical Provider, MD  methotrexate 2.5 MG tablet Take 2.5 mg by mouth once a week.    Historical Provider, MD  methylPREDNIsolone (MEDROL DOSPACK) 4 MG tablet follow package directions 08/03/14   Historical Provider, MD  metoprolol (LOPRESSOR) 50 MG tablet  07/08/14   Historical Provider, MD  NITROSTAT 0.4 MG SL tablet  07/08/14   Historical Provider, MD   predniSONE (DELTASONE) 20 MG tablet 3 tabs po day one, then 2 tabs daily x 2 days, then one tablet daily for 5 days, then 1/2 tablet daily for 5 days 12/24/13   Linwood Dibbles, MD  traMADol (ULTRAM) 50 MG tablet Take 1 tablet (50 mg total) by mouth 3 (three) times daily as needed. 08/16/14   Asa Lente, MD   BP 142/65 mmHg  Pulse 91  Temp(Src) 98.5 F (36.9 C) (Oral)  Resp 16  Ht 5\' 9"  (1.753 m)  Wt 180 lb (81.647 kg)  BMI 26.57 kg/m2  SpO2 100% Physical Exam  Constitutional: She is oriented to person, place, and time. She appears well-developed and well-nourished.  HENT:  Head: Normocephalic and atraumatic.  Cardiovascular: Normal rate and regular rhythm.   SEM at LSB  Pulmonary/Chest: Effort normal and breath sounds normal. No respiratory distress.  Abdominal: Soft. There is no tenderness. There is no rebound and no guarding.  Musculoskeletal: She exhibits no tenderness.  1+ pitting edema in BLE  Neurological: She is alert and oriented to person, place, and time.  Skin: Skin is warm and dry.  Psychiatric: She has a normal mood and affect. Her behavior is normal.  Nursing note and vitals reviewed.   ED Course  Procedures (including critical care time) DIAGNOSTIC STUDIES: Oxygen Saturation is 100% on RA, normal by my interpretation.    COORDINATION OF CARE: 8:18 PM-Discussed treatment plan which includes kidney function labs with pt at bedside and pt agreed to plan.   Labs Review Labs Reviewed  CBC WITH DIFFERENTIAL/PLATELET - Abnormal; Notable for the following:    RBC 3.54 (*)    Hemoglobin 10.3 (*)    HCT 32.9 (*)    RDW 17.2 (*)    All other components within normal limits  BASIC METABOLIC PANEL    Imaging Review No results found.   EKG Interpretation None      MDM   Final diagnoses:  Peripheral edema   Patient here for violation of bilateral lower extremity edema, recently had visited by her cardiologist and primary care provider. She had a negative  Doppler of the lower extremities in the last week. She presentation is not consistent with acute DVT, renal failure, CHF. Discussed with patient home care for leg edema as well as outpatient follow-up and return precautions.  I personally performed the services described in this documentation, which was scribed in my presence. The recorded information has been reviewed and is accurate.    , MD 12/01/14 [phone]

## 2014-11-30 NOTE — ED Notes (Signed)
Patient states that she has had swelling in her bilateral feet and legs x 3 weeks. Reports that when she walks she gets "tingling" in her left leg only. Reports that she has been multiple dr.s and has had complete workups without any answers.

## 2014-11-30 NOTE — Telephone Encounter (Signed)
I was unable to reach [place] on her cell phone--received message that vm is not set up.  I have spoken with her dtr. Erica and given an appt. for 12-04-14 at 1520/fim

## 2014-12-04 ENCOUNTER — Encounter: Payer: Self-pay | Admitting: Neurology

## 2014-12-04 ENCOUNTER — Ambulatory Visit (INDEPENDENT_AMBULATORY_CARE_PROVIDER_SITE_OTHER): Payer: Medicare Other | Admitting: Neurology

## 2014-12-04 VITALS — BP 108/56 | HR 74 | Resp 16 | Ht 68.0 in | Wt 186.2 lb

## 2014-12-04 DIAGNOSIS — M5417 Radiculopathy, lumbosacral region: Secondary | ICD-10-CM

## 2014-12-04 DIAGNOSIS — M069 Rheumatoid arthritis, unspecified: Secondary | ICD-10-CM

## 2014-12-04 DIAGNOSIS — R208 Other disturbances of skin sensation: Secondary | ICD-10-CM

## 2014-12-04 DIAGNOSIS — R600 Localized edema: Secondary | ICD-10-CM | POA: Diagnosis not present

## 2014-12-04 MED ORDER — METHYLPREDNISOLONE 4 MG PO TBPK: ORAL_TABLET | ORAL | Status: DC

## 2014-12-04 NOTE — Progress Notes (Signed)
[facility]  PATIENT: Sara Villanueva DOB: Sep 22, 1945  REFERRING DOCTOR OR PCP:  Olivia Mackie SOURCE: patient  _________________________________   HISTORICAL  CHIEF COMPLAINT:  Chief Complaint  Patient presents with  . Foot Swelling    Sts. onset 3 weeks ago of edema, tingling bilat feet, left worse than right.  Sts. she has seen pcp and rheumatologist for same, (Dr. Skipper Cliche he's toler her her RAS is not the cause of edema/tingling in feet/fim  . Numbness    HISTORY OF PRESENT ILLNESS:  She is noting more swelling and pain in her left foot.   Swelling is up to the ankle but she notes more pain in the lower foot / toes.   She notes some tingling in the toes.    She has not had any injury.   The right side has only minimal swelling.   .   She had a doppler study to r/o DVT.   It and bloodwork were reported normal.     Sciatica/radiculopathy:   She gets some lower back and left leg pain but it is better since [facility] in March.  X-ray of her back showed spondylolisthesis at L5-S1. 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    She has never had surgery in her back in the past.     She also has rheumatoid arthritis and is on methotrexate. She also has some neck pain but that is bothering her much less currently.  REVIEW OF SYSTEMS: Constitutional: No fevers, chills, sweats, or change in appetite Eyes: No visual changes, double vision, eye pain Ear, nose and throat: No hearing loss, ear pain, nasal congestion, sore throat Cardiovascular: No chest pain, palpitations Respiratory: No shortness of breath at rest or with exertion.   No wheezes GastrointestinaI: No nausea, vomiting, diarrhea, abdominal pain, fecal incontinence Genitourinary: No dysuria, urinary retention or frequency.  No nocturia. Musculoskeletal: Minimal neck pain; moderate back pain and left hip pain Integumentary: No rash, pruritus, skin lesions Neurological: as  above Psychiatric: No depression at this time.  No anxiety Endocrine: No palpitations, diaphoresis, change in appetite, change in weigh or increased thirst Hematologic/Lymphatic: No anemia, purpura, petechiae. Allergic/Immunologic: No itchy/runny eyes, nasal congestion, recent allergic reactions, rashes  ALLERGIES: No Known Allergies  HOME MEDICATIONS:  Current outpatient prescriptions:  .  aspirin 81 MG tablet, Take 81 mg by mouth at bedtime. , Disp: , Rfl:  .  BESIVANCE 0.6 % SUSP, , Disp: , Rfl:  .  busPIRone (BUSPAR) 15 MG tablet, , Disp: , Rfl:  .  cetirizine (ZYRTEC) 10 MG tablet, Take 10 mg by mouth at bedtime., Disp: , Rfl:  .  clopidogrel (PLAVIX) 75 MG tablet, Take 75 mg by mouth daily., Disp: , Rfl:  .  ezetimibe (ZETIA) 10 MG tablet, Take 10 mg by mouth at bedtime. , Disp: , Rfl:  .  folic acid (FOLVITE) 1 MG tablet, Take 1 mg by mouth daily., Disp: , Rfl:  .  gabapentin (NEURONTIN) 600 MG tablet, Take 1 tablet (600 mg total) by mouth 3 (three) times daily., Disp: 90 tablet, Rfl: 11 .  halobetasol (ULTRAVATE) 0.05 % cream, , Disp: , Rfl:  .  isosorbide mononitrate (IMDUR) 120 MG 24 hr tablet, , Disp: , Rfl:  .  LINZESS 145 MCG CAPS capsule, , Disp: , Rfl:  .  LIVALO 2 MG TABS, , Disp: , Rfl:  .  methotrexate 2.5 MG tablet, Take 2.5 mg by mouth once a week.,  Disp: , Rfl:  .  methylPREDNIsolone (MEDROL DOSPACK) 4 MG tablet, follow package directions, Disp: , Rfl:  .  metoprolol (LOPRESSOR) 50 MG tablet, , Disp: , Rfl:  .  NITROSTAT 0.4 MG SL tablet, , Disp: , Rfl:  .  predniSONE (DELTASONE) 20 MG tablet, 3 tabs po day one, then 2 tabs daily x 2 days, then one tablet daily for 5 days, then 1/2 tablet daily for 5 days, Disp: 15 tablet, Rfl: 0 .  traMADol (ULTRAM) 50 MG tablet, Take 1 tablet (50 mg total) by mouth 3 (three) times daily as needed., Disp: 30 tablet, Rfl: 3 .  fosinopril (MONOPRIL) 40 MG tablet, , Disp: , Rfl:   PAST MEDICAL HISTORY: Past Medical History   Diagnosis Date  . Carotid arterial disease   . Hypertension   . High cholesterol   . Arthritis   . Rheumatoid arthritis     PAST SURGICAL HISTORY: Past Surgical History  Procedure Laterality Date  . Coronary angioplasty with stent placement      FAMILY HISTORY: Family History  Problem Relation Age of Onset  . Heart disease Mother   . Heart disease Father     SOCIAL HISTORY:  History   Social History  . Marital Status: Married    Spouse Name: N/A  . Number of Children: N/A  . Years of Education: N/A   Occupational History  . Not on file.   Social History Main Topics  . Smoking status: Former Games developer  . Smokeless tobacco: Never Used  . Alcohol Use: No  . Drug Use: No  . Sexual Activity: Yes    Birth Control/ Protection: Post-menopausal   Other Topics Concern  . Not on file   Social History Narrative     PHYSICAL EXAM  Filed Vitals:   12/04/14 1522  BP: 108/56  Pulse: 74  Resp: 16  Height: 5\' 8"  (1.727 m)  Weight: 186 lb 3.2 oz (84.46 kg)    Body mass index is 28.32 kg/(m^2).   General: The patient is well-developed and well-nourished and in no acute distress  Skin/Musculoskeletal:    She has moderate pedal edema on the left. Skin edema on the right. Coloration is normal in the leg. There are no rashes.  Neurologic Exam  Mental status: The patient is alert and oriented x 3 at the time of the examination.    Cranial nerves: Extraocular movements are full.      Motor:  Muscle bulk is normal.   Tone is normal. Strength is  5 / 5 in all 4 extremities.   Sensory: Sensory testing is intact to pinprick, soft touch and vibration sensation in all 4 extremities.  Gait and station: Station is normal.   Gait is arthritic . Tandem gait is mildly wide.  Reflexes: Deep tendon reflexes are symmetric and normal bilaterally.       DIAGNOSTIC DATA (LABS, IMAGING, TESTING) - I reviewed patient records, labs, notes, testing and imaging myself where  available.  Lab Results  Component Value Date   WBC 4.8 11/30/2014   HGB 10.3* 11/30/2014   HCT 32.9* 11/30/2014   MCV 92.9 11/30/2014   PLT 248 11/30/2014      Component Value Date/Time   NA 140 11/30/2014 2025   K 3.6 11/30/2014 2025   CL 109 11/30/2014 2025   CO2 22 11/30/2014 2025   GLUCOSE 141* 11/30/2014 2025   BUN 14 11/30/2014 2025   CREATININE 0.89 11/30/2014 2025   CALCIUM 9.0 11/30/2014 2025  PROT 7.2 06/14/2014 2000   ALBUMIN 3.3* 06/14/2014 2000   AST 40* 06/14/2014 2000   ALT 27 06/14/2014 2000   ALKPHOS 60 06/14/2014 2000   BILITOT 0.5 06/14/2014 2000   GFRNONAA >60 11/30/2014 2025   GFRAA >60 11/30/2014 2025       ASSESSMENT AND PLAN  Pedal edema - Plan: Compression stockings  Dysesthesia  Lumbosacral radiculopathy  Chronic rheumatic arthritis   1.   Compression stockings for the left foot 2.   Medrol Dosepak 3.   If not better after a few weeks, consider a lamotrigine for the dysesthesia. Return to clinic in 4 months or sooner if there are new or worsening neurologic symptoms.  Richard A. Epimenio Foot, MD, PhD 12/04/2014, 3:34 PM Certified in Neurology, Clinical Neurophysiology, Sleep Medicine, Pain Medicine and Neuroimaging  [facility] [address] [place], [place] [address] [phone]

## 2014-12-25 ENCOUNTER — Telehealth: Payer: Self-pay | Admitting: Neurology

## 2014-12-25 DIAGNOSIS — M5410 Radiculopathy, site unspecified: Secondary | ICD-10-CM

## 2014-12-25 DIAGNOSIS — M5416 Radiculopathy, lumbar region: Secondary | ICD-10-CM

## 2014-12-25 NOTE — Telephone Encounter (Signed)
Pt would like to know about an epidural for her back. Please call back and advise [phone]

## 2014-12-26 NOTE — Telephone Encounter (Signed)
I have spoken with Sara Villanueva who sts. lbp has worsened over the last few weeks.  She sts. pain is the same as she has had before and has not had any new injury.  She is requesting esi ordered to be done at [facility].  Per RAS, ok for esi at same level as before.  L4-5 tfesi ordered and referral faxed to [facility]/fim

## 2014-12-26 NOTE — Addendum Note (Signed)
Addended by: Candis Schatz I on: 12/26/2014 10:37 AM   Modules accepted: Orders

## 2014-12-27 ENCOUNTER — Telehealth: Payer: Self-pay | Admitting: Neurology

## 2014-12-27 NOTE — Telephone Encounter (Signed)
Clydie Braun requested to speak with Faith RN regarding a referral for a ESI. Dr. Lucita Ferrara requested to get office notes and requested to get more information on the epidural. Please call and advise.

## 2014-12-27 NOTE — Telephone Encounter (Signed)
Left message with Tamela Oddi for Clydie Braun to call.  I left my cell phone #--Karen can call me at that # so we can avoid playing phone tag/fim

## 2015-01-07 NOTE — Telephone Encounter (Signed)
I have spoken with Sara Villanueva about this pt/fim

## 2015-04-09 ENCOUNTER — Ambulatory Visit (INDEPENDENT_AMBULATORY_CARE_PROVIDER_SITE_OTHER): Payer: Medicare Other | Admitting: Neurology

## 2015-04-09 ENCOUNTER — Encounter: Payer: Self-pay | Admitting: Neurology

## 2015-04-09 VITALS — BP 144/66 | HR 70 | Resp 14 | Ht 66.0 in | Wt 186.0 lb

## 2015-04-09 DIAGNOSIS — M4317 Spondylolisthesis, lumbosacral region: Secondary | ICD-10-CM | POA: Diagnosis not present

## 2015-04-09 DIAGNOSIS — R600 Localized edema: Secondary | ICD-10-CM

## 2015-04-09 DIAGNOSIS — M5417 Radiculopathy, lumbosacral region: Secondary | ICD-10-CM

## 2015-04-09 DIAGNOSIS — M5430 Sciatica, unspecified side: Secondary | ICD-10-CM | POA: Diagnosis not present

## 2015-04-09 DIAGNOSIS — M069 Rheumatoid arthritis, unspecified: Secondary | ICD-10-CM | POA: Diagnosis not present

## 2015-04-09 MED ORDER — LAMOTRIGINE 25 MG PO TABS: ORAL_TABLET | ORAL | Status: DC

## 2015-04-09 MED ORDER — LAMOTRIGINE 100 MG PO TABS: 100.0000 mg | ORAL_TABLET | Freq: Every day | ORAL | Status: DC

## 2015-04-09 NOTE — Progress Notes (Addendum)
[facility]  PATIENT: Sara Villanueva DOB: 07-01-1945  REFERRING DOCTOR OR PCP:  Olivia Mackie SOURCE: patient  _________________________________   HISTORICAL  CHIEF COMPLAINT:  Chief Complaint  Patient presents with  . Back Pain    Sts. pedal edema has resolved, Now she c/o pain lateral left leg, just below knee, and tingling in her foot.  Denies back pain./fim  . Pedal Edema    HISTORY OF PRESENT ILLNESS:  She reports left foot pain and the distribution is the side is noting more swelling and pain in her left foot.  She denies much back pain.       She notes some tingling in the toes, left >>right.    X-ray of her back (personally reviewed) showed spondylolisthesis at L5-S1.    She has never had surgery in her back in the past.   In the past she has received some benefit from [facility], TPI and medrol dose packs.    She is not getting much benefit from gabapentin.   Tramadol has not helped  Edema is better.   Last visit, the left leg had pedal edema.        She has not had any injury.   The right side has only minimal swelling.   .   She had a doppler study to r/o DVT.   It and bloodwork were reported normal.   She also has rheumatoid arthritis and is on methotrexate. She has some neck pain but that is bothering her much less currently.  REVIEW OF SYSTEMS: Constitutional: No fevers, chills, sweats, or change in appetite Eyes: No visual changes, double vision, eye pain Ear, nose and throat: No hearing loss, ear pain, nasal congestion, sore throat Cardiovascular: No chest pain, palpitations Respiratory: No shortness of breath at rest or with exertion.   No wheezes GastrointestinaI: No nausea, vomiting, diarrhea, abdominal pain, fecal incontinence Genitourinary: No dysuria, urinary retention or frequency.  No nocturia. Musculoskeletal: Minimal neck pain; moderate back pain and left hip pain Integumentary: No rash, pruritus, skin lesions Neurological: as  above Psychiatric: No depression at this time.  No anxiety Endocrine: No palpitations, diaphoresis, change in appetite, change in weigh or increased thirst Hematologic/Lymphatic: No anemia, purpura, petechiae. Allergic/Immunologic: No itchy/runny eyes, nasal congestion, recent allergic reactions, rashes  ALLERGIES: No Known Allergies  HOME MEDICATIONS:  Current outpatient prescriptions:  .  aspirin 81 MG tablet, Take 81 mg by mouth at bedtime. , Disp: , Rfl:  .  busPIRone (BUSPAR) 15 MG tablet, , Disp: , Rfl:  .  cetirizine (ZYRTEC) 10 MG tablet, Take 10 mg by mouth at bedtime., Disp: , Rfl:  .  clopidogrel (PLAVIX) 75 MG tablet, Take 75 mg by mouth daily., Disp: , Rfl:  .  ezetimibe (ZETIA) 10 MG tablet, Take 10 mg by mouth at bedtime. , Disp: , Rfl:  .  FLUZONE HIGH-DOSE 0.5 ML SUSY, , Disp: , Rfl:  .  folic acid (FOLVITE) 1 MG tablet, Take 1 mg by mouth daily., Disp: , Rfl:  .  fosinopril (MONOPRIL) 40 MG tablet, , Disp: , Rfl:  .  gabapentin (NEURONTIN) 600 MG tablet, Take 1 tablet (600 mg total) by mouth 3 (three) times daily., Disp: 90 tablet, Rfl: 11 .  halobetasol (ULTRAVATE) 0.05 % cream, , Disp: , Rfl:  .  LINZESS 145 MCG CAPS capsule, , Disp: , Rfl:  .  LIVALO 2 MG TABS, , Disp: , Rfl:  .  methotrexate 2.5 MG tablet, Take 2.5 mg by  mouth once a week., Disp: , Rfl:  .  methylPREDNISolone (MEDROL DOSEPAK) 4 MG TBPK tablet, Take as directed, Disp: 21 tablet, Rfl: 0 .  metoprolol (LOPRESSOR) 50 MG tablet, , Disp: , Rfl:  .  NITROSTAT 0.4 MG SL tablet, , Disp: , Rfl:  .  torsemide (DEMADEX) 10 MG tablet, Take 10 mg by mouth., Disp: , Rfl:  .  traMADol (ULTRAM) 50 MG tablet, Take 1 tablet (50 mg total) by mouth 3 (three) times daily as needed., Disp: 30 tablet, Rfl: 3 .  BESIVANCE 0.6 % SUSP, , Disp: , Rfl:  .  isosorbide mononitrate (IMDUR) 120 MG 24 hr tablet, , Disp: , Rfl:  .  predniSONE (DELTASONE) 20 MG tablet, 3 tabs po day one, then 2 tabs daily x 2 days, then one tablet  daily for 5 days, then 1/2 tablet daily for 5 days (Patient not taking: Reported on 04/09/2015), Disp: 15 tablet, Rfl: 0  PAST MEDICAL HISTORY: Past Medical History  Diagnosis Date  . Carotid arterial disease (HCC)   . Hypertension   . High cholesterol   . Arthritis   . Rheumatoid arthritis (HCC)     PAST SURGICAL HISTORY: Past Surgical History  Procedure Laterality Date  . Coronary angioplasty with stent placement      FAMILY HISTORY: Family History  Problem Relation Age of Onset  . Heart disease Mother   . Heart disease Father     SOCIAL HISTORY:  Social History   Social History  . Marital Status: Married    Spouse Name: N/A  . Number of Children: N/A  . Years of Education: N/A   Occupational History  . Not on file.   Social History Main Topics  . Smoking status: Former Games developer  . Smokeless tobacco: Never Used  . Alcohol Use: No  . Drug Use: No  . Sexual Activity: Yes    Birth Control/ Protection: Post-menopausal   Other Topics Concern  . Not on file   Social History Narrative     PHYSICAL EXAM  Filed Vitals:   04/09/15 1301  BP: 144/66  Pulse: 70  Resp: 14  Height: 5\' 6"  (1.676 m)  Weight: 186 lb (84.369 kg)    Body mass index is 30.04 kg/(m^2).   General: The patient is well-developed and well-nourished and in no acute distress  Skin/Musculoskeletal:    She has moderate pedal edema on the left. Skin edema on the right. Coloration is normal in the leg. There are no rashes.  Neurologic Exam  Mental status: The patient is alert and oriented x 3 at the time of the examination.    Cranial nerves: Extraocular movements are full.      Motor:  Muscle bulk is normal.   Tone is normal. Strength is  5 / 5 in all 4 extremities.   Sensory: Sensory testing is intact to pinprick, soft touch and vibration sensation in all 4 extremities.  Gait and station: Station is normal.   Gait is arthritic . Tandem gait is mildly wide.  Reflexes: Deep tendon  reflexes are symmetric and normal bilaterally.       DIAGNOSTIC DATA (LABS, IMAGING, TESTING) - I reviewed patient records, labs, notes, testing and imaging myself where available.  Lab Results  Component Value Date   WBC 4.8 11/30/2014   HGB 10.3* 11/30/2014   HCT 32.9* 11/30/2014   MCV 92.9 11/30/2014   PLT 248 11/30/2014      Component Value Date/Time   NA 140 11/30/2014  2025   K 3.6 11/30/2014 2025   CL 109 11/30/2014 2025   CO2 22 11/30/2014 2025   GLUCOSE 141* 11/30/2014 2025   BUN 14 11/30/2014 2025   CREATININE 0.89 11/30/2014 2025   CALCIUM 9.0 11/30/2014 2025   PROT 7.2 06/14/2014 2000   ALBUMIN 3.3* 06/14/2014 2000   AST 40* 06/14/2014 2000   ALT 27 06/14/2014 2000   ALKPHOS 60 06/14/2014 2000   BILITOT 0.5 06/14/2014 2000   GFRNONAA >60 11/30/2014 2025   GFRAA >60 11/30/2014 2025       ASSESSMENT AND PLAN  Lumbosacral radiculopathy  Pedal edema  Rheumatoid arthritis involving multiple sites, unspecified rheumatoid factor presence (HCC)  Spondylolisthesis at L5-S1 level  Neuralgia neuritis, sciatic nerve, unspecified laterality    1.   trigger point inject the L4L5 and L5S1 paraspinal muscles bilaterally with 80 mg Depo-Medrol in marcaine using sterile technique.   She tolerated the procedure well.    2.   As gabapentin not beneficial, switch to Lamotrigine for the dysesthesic pain.    Titrate over 5 weeks to 100 mg po bid. 3.   Return to clinic in 4 months or sooner if there are new or worsening neurologic symptoms.  Richard A. Epimenio Foot, MD, PhD 04/09/2015, 1:09 PM Certified in Neurology, Clinical Neurophysiology, Sleep Medicine, Pain Medicine and Neuroimaging  [facility] [address] [place], [place] [address] [phone]

## 2015-04-09 NOTE — Patient Instructions (Signed)
The pharmacy has the prescription of lamotrigine 25 mg. Please take it as follows: For 1 week take 1 pill once a day. The second week, take 1 pill twice a day. The third week, take 1 pill in the morning and 2 at bedtime or evening. The fourth week take 2 pills twice a day.  I have also give you a paper prescription for lamotrigine 100 mg tablets after the first 4 weeks you should take 100 mg twice a day.  Most people tolerate lamotrigine very well. Some will get a rash. If you do get a significant rash stop the medicine immediately and let us know. 

## 2015-04-29 ENCOUNTER — Telehealth: Payer: Self-pay | Admitting: Neurology

## 2015-04-29 DIAGNOSIS — M5416 Radiculopathy, lumbar region: Secondary | ICD-10-CM

## 2015-04-29 NOTE — Telephone Encounter (Signed)
Ok for her to have ESI at [facility]

## 2015-04-29 NOTE — Telephone Encounter (Signed)
I have spoken with Sara Villanueva this morning--she sts. she developed a rash with Lamictal, so stopped it on 04-25-15.  She c/o worsening lbp, radiating down lateral aspect of left leg to foot, tingling in left foot.  Would like esi at [facility].  She had a L4-5 esi (without fluoro) here at [facility] on 08-11-14, then had esi under fluoro at [facility] on 09-19-14, and then again at the end of July or beginning of August, not sure of exact date.  Will check with RAS and call her back/fim

## 2015-04-29 NOTE — Telephone Encounter (Signed)
L4-L5 tfesi ordered to be done at [facility].  I have spoken with Mrs. Troupe and made her aware referral has been ordered.  She verbalized understanding of same/fim

## 2015-04-29 NOTE — Telephone Encounter (Signed)
Patient called to advise lamoTRIgine (LAMICTAL) 25 MG tablet caused a rash on her skin, back of both thighs. Patient discontinued medication per Dr. Epimenio Foot' instructions. Patient request referral to Neuro in [facility] for epidural.

## 2015-05-03 DIAGNOSIS — R350 Frequency of micturition: Secondary | ICD-10-CM | POA: Insufficient documentation

## 2015-05-03 DIAGNOSIS — R3915 Urgency of urination: Secondary | ICD-10-CM | POA: Insufficient documentation

## 2015-05-15 NOTE — Telephone Encounter (Addendum)
Pt called sts she thought referral was for epidural. Please call and advise at [phone]

## 2015-05-16 NOTE — Telephone Encounter (Signed)
Patient called back regarding referral for epidural in [facility], state she had requested epidural, states the place she was referred to called and was trying to set up something else.

## 2015-05-16 NOTE — Telephone Encounter (Signed)
I have spoken with Mrs. Abdulaziz this morning and confirmed that order for lumbar esi was sent to [facility]./fim

## 2015-05-17 NOTE — Telephone Encounter (Signed)
Patient says she spoke with [facility] in [facility] and they were going to set her up for a consult. The patient says she only needs an epidural. Please call patient and discuss.

## 2015-05-17 NOTE — Telephone Encounter (Signed)
I have spoken with Yoanna and let her know I am waiting to hear back from [facility]/fim

## 2015-05-17 NOTE — Telephone Encounter (Signed)
I have called [facility]--pt. was referred there for lumbar esi; it appears they thought the referral was for consult, then esi.  Edythe Clarity or other staff member to call me to discuss/fim

## 2015-05-20 NOTE — Telephone Encounter (Signed)
I have spoken with Vernona Rieger (Dr. Larina Earthly cma) at [facility].  Per her request, I have faxed order for esi, along with ins. and demo sheets, and last ov note to her at [phone]

## 2015-05-20 NOTE — Telephone Encounter (Signed)
I have spoken with [facility] again to f/u on this--messge left for Sara Villanueva to call me/fim

## 2015-06-13 DIAGNOSIS — I10 Essential (primary) hypertension: Secondary | ICD-10-CM | POA: Insufficient documentation

## 2015-06-13 DIAGNOSIS — M069 Rheumatoid arthritis, unspecified: Secondary | ICD-10-CM | POA: Insufficient documentation

## 2015-06-13 DIAGNOSIS — I7090 Unspecified atherosclerosis: Secondary | ICD-10-CM | POA: Insufficient documentation

## 2015-06-13 DIAGNOSIS — E785 Hyperlipidemia, unspecified: Secondary | ICD-10-CM | POA: Insufficient documentation

## 2015-07-14 ENCOUNTER — Encounter (HOSPITAL_BASED_OUTPATIENT_CLINIC_OR_DEPARTMENT_OTHER): Payer: Self-pay | Admitting: Emergency Medicine

## 2015-07-14 ENCOUNTER — Emergency Department (HOSPITAL_BASED_OUTPATIENT_CLINIC_OR_DEPARTMENT_OTHER)
Admission: EM | Admit: 2015-07-14 | Discharge: 2015-07-14 | Disposition: A | Discharge status: Home / Self Care | Payer: Medicare Other | Attending: Emergency Medicine | Admitting: Emergency Medicine

## 2015-07-14 DIAGNOSIS — Z79899 Other long term (current) drug therapy: Secondary | ICD-10-CM | POA: Diagnosis not present

## 2015-07-14 DIAGNOSIS — E7800 Pure hypercholesterolemia, unspecified: Secondary | ICD-10-CM | POA: Diagnosis not present

## 2015-07-14 DIAGNOSIS — M1990 Unspecified osteoarthritis, unspecified site: Secondary | ICD-10-CM | POA: Insufficient documentation

## 2015-07-14 DIAGNOSIS — M25462 Effusion, left knee: Secondary | ICD-10-CM | POA: Insufficient documentation

## 2015-07-14 DIAGNOSIS — I10 Essential (primary) hypertension: Secondary | ICD-10-CM | POA: Insufficient documentation

## 2015-07-14 DIAGNOSIS — Z87891 Personal history of nicotine dependence: Secondary | ICD-10-CM | POA: Diagnosis not present

## 2015-07-14 DIAGNOSIS — Z7982 Long term (current) use of aspirin: Secondary | ICD-10-CM | POA: Insufficient documentation

## 2015-07-14 DIAGNOSIS — Z955 Presence of coronary angioplasty implant and graft: Secondary | ICD-10-CM | POA: Diagnosis not present

## 2015-07-14 DIAGNOSIS — M069 Rheumatoid arthritis, unspecified: Secondary | ICD-10-CM | POA: Diagnosis not present

## 2015-07-14 DIAGNOSIS — M25562 Pain in left knee: Secondary | ICD-10-CM | POA: Diagnosis present

## 2015-07-14 DIAGNOSIS — Z7902 Long term (current) use of antithrombotics/antiplatelets: Secondary | ICD-10-CM | POA: Diagnosis not present

## 2015-07-14 MED ORDER — NAPROXEN 500 MG PO TABS: 500.0000 mg | ORAL_TABLET | Freq: Two times a day (BID) | ORAL | Status: DC

## 2015-07-14 MED ORDER — PREDNISONE 10 MG PO TABS: 40.0000 mg | ORAL_TABLET | Freq: Every day | ORAL | Status: AC

## 2015-07-14 MED ORDER — PREDNISONE 50 MG PO TABS
60.0000 mg | ORAL_TABLET | Freq: Once | ORAL | Status: AC
  Administered 2015-07-14: 60 mg via ORAL
  Filled 2015-07-14: qty 1

## 2015-07-14 MED ORDER — HYDROCHLOROTHIAZIDE 25 MG PO TABS: 25.0000 mg | ORAL_TABLET | Freq: Every day | ORAL | Status: DC

## 2015-07-14 MED ORDER — KETOROLAC TROMETHAMINE 60 MG/2ML IM SOLN
60.0000 mg | Freq: Once | INTRAMUSCULAR | Status: AC
  Administered 2015-07-14: 60 mg via INTRAMUSCULAR
  Filled 2015-07-14: qty 2

## 2015-07-14 MED ORDER — HYDROCHLOROTHIAZIDE 25 MG PO TABS
25.0000 mg | ORAL_TABLET | Freq: Once | ORAL | Status: AC
  Administered 2015-07-14: 25 mg via ORAL
  Filled 2015-07-14: qty 1

## 2015-07-14 NOTE — ED Notes (Signed)
In to assess vitals for discharge -BP 222/98 in the left arm while sitting, BP 199/79 in the RA while lying, pt c/o new onset of headache of headache that began 10 minutes ago and lasted approximately 3 minutes, but has resolved, no CP, no dizziness, no vomiting. Pt states "my BP is never that high and wasn't high when I come in to the ED." pain 8/10 in left knee. EDP notified and called to bedside for assessment of patient.

## 2015-07-14 NOTE — ED Notes (Signed)
Pt reports left knee pain starting yesterday while at work - pt was sitting talking and stood up with stabbing pain to anterior left knee.

## 2015-07-14 NOTE — ED Provider Notes (Signed)
CSN: [id number]     Arrival date & time 07/14/15  1239 History   First MD Initiated Contact with Patient 07/14/15 1428     Chief Complaint  Patient presents with  . Knee Pain     (Consider location/radiation/quality/duration/timing/severity/associated sxs/prior Treatment) Patient is a 70 y.o. female presenting with knee pain.  Knee Pain Location:  Knee Time since incident:  1 day Injury: no   Knee location:  L knee Pain details:    Quality:  Sharp   Radiates to:  Does not radiate   Severity:  Severe   Onset quality:  Sudden   Duration:  1 day   Timing:  Constant   Progression:  Unchanged Chronicity:  New Dislocation: no   Foreign body present:  No foreign bodies Prior injury to area:  No Relieved by:  Nothing Worsened by:  Bearing weight and flexion Ineffective treatments:  NSAIDs Associated symptoms: swelling   Associated symptoms: no back pain, no decreased ROM, no fever, no muscle weakness, no neck pain and no numbness     Past Medical History  Diagnosis Date  . Carotid arterial disease (HCC)   . Hypertension   . High cholesterol   . Arthritis   . Rheumatoid arthritis [facility])    Past Surgical History  Procedure Laterality Date  . Coronary angioplasty with stent placement     Family History  Problem Relation Age of Onset  . Heart disease Mother   . Heart disease Father    Social History  Substance Use Topics  . Smoking status: Former Games developer  . Smokeless tobacco: Never Used  . Alcohol Use: No   OB History    No data available     Review of Systems  Constitutional: Negative for fever.  HENT: Negative for sore throat.   Eyes: Negative for visual disturbance.  Respiratory: Negative for cough and shortness of breath.   Cardiovascular: Negative for chest pain.  Gastrointestinal: Negative for abdominal pain.  Genitourinary: Negative for difficulty urinating.  Musculoskeletal: Negative for back pain and neck pain.  Skin: Negative for rash.   Neurological: Negative for syncope and headaches.      Allergies  Review of patient's allergies indicates no known allergies.  Home Medications   Prior to Admission medications   Medication Sig Start Date End Date Taking? Authorizing Provider  montelukast (SINGULAIR) 10 MG tablet Take 10 mg by mouth at bedtime.   Yes Historical Provider, MD  aspirin 81 MG tablet Take 81 mg by mouth at bedtime.     Historical Provider, MD  BESIVANCE 0.6 % SUSP  07/24/14   Historical Provider, MD  busPIRone (BUSPAR) 15 MG tablet  08/01/14   Historical Provider, MD  cetirizine (ZYRTEC) 10 MG tablet Take 10 mg by mouth at bedtime.    Historical Provider, MD  clopidogrel (PLAVIX) 75 MG tablet Take 75 mg by mouth daily.    Historical Provider, MD  ezetimibe (ZETIA) 10 MG tablet Take 10 mg by mouth at bedtime.     Historical Provider, MD  FLUZONE HIGH-DOSE 0.5 ML SUSY  01/23/15   Historical Provider, MD  folic acid (FOLVITE) 1 MG tablet Take 1 mg by mouth daily.    Historical Provider, MD  fosinopril (MONOPRIL) 40 MG tablet  05/14/14   Historical Provider, MD  gabapentin (NEURONTIN) 600 MG tablet Take 1 tablet (600 mg total) by mouth 3 (three) times daily. 08/16/14   Asa Lente, MD  halobetasol (ULTRAVATE) 0.05 % cream  07/05/14  Historical Provider, MD  hydrochlorothiazide (HYDRODIURIL) 25 MG tablet Take 1 tablet (25 mg total) by mouth daily. 07/14/15 08/11/15  Alvira Monday, MD  isosorbide mononitrate (IMDUR) 120 MG 24 hr tablet 120 mg.  07/21/14   Historical Provider, MD  lamoTRIgine (LAMICTAL) 100 MG tablet Take 1 tablet (100 mg total) by mouth daily. 04/09/15   Asa Lente, MD  lamoTRIgine (LAMICTAL) 25 MG tablet 1 po qd x 1 wk then 1 po bid x 1 wk then 1 po tid x 1 wk then 2 po bid 04/09/15   Asa Lente, MD  Karlene Einstein 145 MCG CAPS capsule  07/04/14   Historical Provider, MD  LIVALO 2 MG TABS  07/12/14   Historical Provider, MD  methotrexate 2.5 MG tablet Take 2.5 mg by mouth once a week.    Historical  Provider, MD  metoprolol (LOPRESSOR) 50 MG tablet  07/08/14   Historical Provider, MD  naproxen (NAPROSYN) 500 MG tablet Take 1 tablet (500 mg total) by mouth 2 (two) times daily. 07/14/15   Alvira Monday, MD  NITROSTAT 0.4 MG SL tablet  07/08/14   Historical Provider, MD  predniSONE (DELTASONE) 10 MG tablet Take 4 tablets (40 mg total) by mouth daily. 07/14/15 07/18/15  Alvira Monday, MD  torsemide (DEMADEX) 10 MG tablet Take 10 mg by mouth. 12/06/14   Historical Provider, MD  traMADol (ULTRAM) 50 MG tablet Take 1 tablet (50 mg total) by mouth 3 (three) times daily as needed. 08/16/14   Asa Lente, MD   BP 200/98 mmHg  Pulse 71  Temp(Src) 98.3 F (36.8 C) (Oral)  Resp 18  Ht 5\' 8"  (1.727 m)  Wt 175 lb (79.379 kg)  BMI 26.61 kg/m2  SpO2 100% Physical Exam  Constitutional: She is oriented to person, place, and time. She appears well-developed and well-nourished. No distress.  HENT:  Head: Normocephalic and atraumatic.  Eyes: Conjunctivae and EOM are normal.  Neck: Normal range of motion.  Cardiovascular: Normal rate, regular rhythm, normal heart sounds and intact distal pulses.  Exam reveals no gallop and no friction rub.   No murmur heard. Pulmonary/Chest: Effort normal and breath sounds normal. No respiratory distress. She has no wheezes. She has no rales.  Abdominal: Soft. She exhibits no distension. There is no tenderness. There is no guarding.  Musculoskeletal: She exhibits no edema.       Left knee: She exhibits swelling and effusion. She exhibits normal range of motion, no ecchymosis, no deformity, no laceration, no erythema, normal alignment, no LCL laxity and normal patellar mobility. Tenderness found. Medial joint line and MCL tenderness noted.       Left lower leg: She exhibits no swelling and no edema.  Neurological: She is alert and oriented to person, place, and time.  Skin: Skin is warm and dry. No rash noted. She is not diaphoretic. No erythema.  Nursing note and vitals  reviewed.   ED Course  Procedures (including critical care time) Labs Review Labs Reviewed - No data to display  Imaging Review No results found. I have personally reviewed and evaluated these images and lab results as part of my medical decision-making.   EKG Interpretation None      MDM   Final diagnoses:  Knee swelling, left  Essential hypertension   70yo female with history of RA, htn, hypercholesterolemia, carotid artery disease, present with atraumatic knee pain.  Patient without erythema, no fever, full range of motion of joint and have low suspicion for septic arthritis.  No history of trauma to suggest fracture or acute ligamentous injury. Doutbt DVT given pain concentrated at knee, no asymmetric leg swellinlg. DDx for pain in left knee includes exacerbation of pain related to rheumatoid arthritis flare, osteoarthritis, patello-femoral syndrome, or other meniscal abnormality.  Given no increased walking/exercise, swelling to knee, and history of RA, will treat as RA flare with 5 days of prednisone and recommend close follow up with PCP and rheumatologist.  Patient's blood pressures initially 160s, however on recheck prior to discharge was 200s. Patient without neurologic symptoms, no chest pain, no shortness of breath and have low suspicion for hypertensive emergencies including low suspicion for [facility], hypertensive encephalopathy, stroke, MI, aortic dissection, pulmonary edema.  She described a brief headache lasting 4 minutes which resolved spontaneously without other symptoms and doubt SAH.  Pt takes metoprolol, decided to add hctz. Pt BP persistently elevated at this time to 190s, 200, and given persistence of elevated BP here in ED will initiate hctz as outpt medication and have pt follow up closely with her PCP.  Discussed importance of close primary care follow up and reasons to return to the ED in detail. Patient discharged in stable condition with understanding of reasons to  return.    Alvira Monday, MD 07/15/15 1105

## 2015-07-14 NOTE — Discharge Instructions (Signed)
Knee Effusion °Knee effusion means that you have excess fluid in your knee joint. This can cause pain and swelling in your knee. This may make your knee more difficult to bend and move. That is because there is increased pain and pressure in the joint. If there is fluid in your knee, it often means that something is wrong inside your knee, such as severe arthritis, abnormal inflammation, or an infection. Another common cause of knee effusion is an injury to the knee muscles, ligaments, or cartilage. °HOME CARE INSTRUCTIONS °· Use crutches as directed by your health care provider. °· Wear a knee brace as directed by your health care provider. °· Apply ice to the swollen area: °¨ Put ice in a plastic bag. °¨ Place a towel between your skin and the bag. °¨ Leave the ice on for 20 minutes, 2-3 times per day. °· Keep your knee raised (elevated) when you are sitting or lying down. °· Take medicines only as directed by your health care provider. °· Do any rehabilitation or strengthening exercises as directed by your health care provider. °· Rest your knee as directed by your health care provider. You may start doing your normal activities again when your health care provider approves.    °· Keep all follow-up visits as directed by your health care provider. This is important. °SEEK MEDICAL CARE IF: °· You have ongoing (persistent) pain in your knee. °SEEK IMMEDIATE MEDICAL CARE IF: °· You have increased swelling or redness of your knee. °· You have severe pain in your knee. °· You have a fever. °  °This information is not intended to replace advice given to you by your health care provider. Make sure you discuss any questions you have with your health care provider. °  °Document Released: 08/08/2003 Document Revised: 06/08/2014 Document Reviewed: 01/01/2014 °Elsevier Interactive Patient Education ©2016 Elsevier Inc. ° °

## 2015-07-15 DIAGNOSIS — I6529 Occlusion and stenosis of unspecified carotid artery: Secondary | ICD-10-CM | POA: Insufficient documentation

## 2015-07-15 DIAGNOSIS — I6523 Occlusion and stenosis of bilateral carotid arteries: Secondary | ICD-10-CM | POA: Insufficient documentation

## 2015-07-15 DIAGNOSIS — I701 Atherosclerosis of renal artery: Secondary | ICD-10-CM | POA: Insufficient documentation

## 2015-07-15 DIAGNOSIS — J309 Allergic rhinitis, unspecified: Secondary | ICD-10-CM | POA: Insufficient documentation

## 2015-07-15 DIAGNOSIS — I739 Peripheral vascular disease, unspecified: Secondary | ICD-10-CM | POA: Insufficient documentation

## 2015-07-17 ENCOUNTER — Other Ambulatory Visit: Payer: Self-pay | Admitting: Physician Assistant

## 2015-07-17 DIAGNOSIS — M25562 Pain in left knee: Secondary | ICD-10-CM

## 2015-07-18 ENCOUNTER — Other Ambulatory Visit: Payer: Self-pay | Admitting: Internal Medicine

## 2015-07-18 DIAGNOSIS — M87052 Idiopathic aseptic necrosis of left femur: Secondary | ICD-10-CM

## 2015-07-27 ENCOUNTER — Other Ambulatory Visit: Payer: Medicare Other

## 2015-08-05 ENCOUNTER — Other Ambulatory Visit (HOSPITAL_BASED_OUTPATIENT_CLINIC_OR_DEPARTMENT_OTHER): Payer: Self-pay | Admitting: Family Medicine

## 2015-08-05 ENCOUNTER — Encounter: Payer: Self-pay | Admitting: Neurology

## 2015-08-05 ENCOUNTER — Ambulatory Visit (INDEPENDENT_AMBULATORY_CARE_PROVIDER_SITE_OTHER): Payer: Medicare Other | Admitting: Neurology

## 2015-08-05 VITALS — BP 126/84 | HR 72 | Resp 14 | Ht 68.0 in | Wt 188.2 lb

## 2015-08-05 DIAGNOSIS — R208 Other disturbances of skin sensation: Secondary | ICD-10-CM | POA: Diagnosis not present

## 2015-08-05 DIAGNOSIS — M8700 Idiopathic aseptic necrosis of unspecified bone: Secondary | ICD-10-CM | POA: Diagnosis not present

## 2015-08-05 DIAGNOSIS — M4317 Spondylolisthesis, lumbosacral region: Secondary | ICD-10-CM

## 2015-08-05 DIAGNOSIS — Z1231 Encounter for screening mammogram for malignant neoplasm of breast: Secondary | ICD-10-CM

## 2015-08-05 DIAGNOSIS — M25562 Pain in left knee: Secondary | ICD-10-CM | POA: Insufficient documentation

## 2015-08-05 MED ORDER — CELECOXIB 200 MG PO CAPS: 200.0000 mg | ORAL_CAPSULE | Freq: Two times a day (BID) | ORAL | Status: DC

## 2015-08-05 NOTE — Progress Notes (Signed)
[facility]  PATIENT: Sara Villanueva DOB: 1945-08-22  REFERRING DOCTOR OR PCP:  Olivia Mackie SOURCE: patient  _________________________________   HISTORICAL  CHIEF COMPLAINT:  Chief Complaint  Patient presents with  . Back Pain    Sts. back pain is "alright" right now.  She still has some left knee pain but is seeing ortho for this.  She was unable to tolerate Lamictal (rash), so has continued Gabapentin and sts. tolerates it well/fim    HISTORY OF PRESENT ILLNESS:  Dysesthesia:    We started lamotrigine for her dysesthetic pain but she got a rash so she went back to gabapentin which had not helped much for her.    Pain is not too bad now and tingling pain was worse in the past.     LBP      X-ray of her back (personally reviewed) showed spondylolisthesis at L5-S1.    She has never had surgery in her back in the past.   In the past she has received some benefit from [facility], TPI and medrol dose packs.    She is not getting much benefit from gabapentin.   Tramadol has not helped  She is noting more left knee pain.    Her rheumatologist did an injection without benefit so she has been referred to orthopedics.    Knee surgery had been discussed with her in the past.    She also has rheumatoid arthritis and is on methotrexate. She has some neck pain but that is bothering her much less currently.  REVIEW OF SYSTEMS: Constitutional: No fevers, chills, sweats, or change in appetite Eyes: No visual changes, double vision, eye pain Ear, nose and throat: No hearing loss, ear pain, nasal congestion, sore throat Cardiovascular: No chest pain, palpitations Respiratory: No shortness of breath at rest or with exertion.   No wheezes GastrointestinaI: No nausea, vomiting, diarrhea, abdominal pain, fecal incontinence Genitourinary: No dysuria, urinary retention or frequency.  No nocturia. Musculoskeletal: Minimal neck pain; moderate back pain and left hip pain Integumentary: No  rash, pruritus, skin lesions Neurological: as above Psychiatric: No depression at this time.  No anxiety Endocrine: No palpitations, diaphoresis, change in appetite, change in weigh or increased thirst Hematologic/Lymphatic: No anemia, purpura, petechiae. Allergic/Immunologic: No itchy/runny eyes, nasal congestion, recent allergic reactions, rashes  ALLERGIES: Allergies  Allergen Reactions  . Latex   . Lamictal [Lamotrigine] Rash    HOME MEDICATIONS:  Current outpatient prescriptions:  .  aspirin 81 MG tablet, Take 81 mg by mouth at bedtime. , Disp: , Rfl:  .  BESIVANCE 0.6 % SUSP, , Disp: , Rfl:  .  busPIRone (BUSPAR) 15 MG tablet, , Disp: , Rfl:  .  cetirizine (ZYRTEC) 10 MG tablet, Take 10 mg by mouth at bedtime., Disp: , Rfl:  .  clopidogrel (PLAVIX) 75 MG tablet, Take 75 mg by mouth daily., Disp: , Rfl:  .  ezetimibe (ZETIA) 10 MG tablet, Take 10 mg by mouth at bedtime. , Disp: , Rfl:  .  FLUZONE HIGH-DOSE 0.5 ML SUSY, , Disp: , Rfl:  .  folic acid (FOLVITE) 1 MG tablet, Take 1 mg by mouth daily., Disp: , Rfl:  .  fosinopril (MONOPRIL) 40 MG tablet, , Disp: , Rfl:  .  gabapentin (NEURONTIN) 600 MG tablet, Take 1 tablet (600 mg total) by mouth 3 (three) times daily., Disp: 90 tablet, Rfl: 11 .  halobetasol (ULTRAVATE) 0.05 % cream, , Disp: , Rfl:  .  hydrochlorothiazide (HYDRODIURIL) 25 MG tablet,  Take 1 tablet (25 mg total) by mouth daily., Disp: 30 tablet, Rfl: 0 .  isosorbide mononitrate (IMDUR) 120 MG 24 hr tablet, 120 mg. , Disp: , Rfl:  .  lamoTRIgine (LAMICTAL) 100 MG tablet, Take 1 tablet (100 mg total) by mouth daily., Disp: 60 tablet, Rfl: 11 .  lamoTRIgine (LAMICTAL) 25 MG tablet, 1 po qd x 1 wk then 1 po bid x 1 wk then 1 po tid x 1 wk then 2 po bid, Disp: 70 tablet, Rfl: 0 .  LINZESS 145 MCG CAPS capsule, , Disp: , Rfl:  .  LIVALO 2 MG TABS, , Disp: , Rfl:  .  methotrexate 2.5 MG tablet, Take 2.5 mg by mouth once a week., Disp: , Rfl:  .  metoprolol (LOPRESSOR) 50  MG tablet, , Disp: , Rfl:  .  montelukast (SINGULAIR) 10 MG tablet, Take 10 mg by mouth at bedtime., Disp: , Rfl:  .  naproxen (NAPROSYN) 500 MG tablet, Take 1 tablet (500 mg total) by mouth 2 (two) times daily., Disp: 30 tablet, Rfl: 0 .  NITROSTAT 0.4 MG SL tablet, , Disp: , Rfl:  .  torsemide (DEMADEX) 10 MG tablet, Take 10 mg by mouth., Disp: , Rfl:  .  traMADol (ULTRAM) 50 MG tablet, Take 1 tablet (50 mg total) by mouth 3 (three) times daily as needed., Disp: 30 tablet, Rfl: 3  PAST MEDICAL HISTORY: Past Medical History  Diagnosis Date  . Carotid arterial disease (HCC)   . Hypertension   . High cholesterol   . Arthritis   . Rheumatoid arthritis (HCC)     PAST SURGICAL HISTORY: Past Surgical History  Procedure Laterality Date  . Coronary angioplasty with stent placement      FAMILY HISTORY: Family History  Problem Relation Age of Onset  . Heart disease Mother   . Heart disease Father     SOCIAL HISTORY:  Social History   Social History  . Marital Status: Married    Spouse Name: N/A  . Number of Children: N/A  . Years of Education: N/A   Occupational History  . Not on file.   Social History Main Topics  . Smoking status: Former Games developer  . Smokeless tobacco: Never Used  . Alcohol Use: No  . Drug Use: No  . Sexual Activity: Yes    Birth Control/ Protection: Post-menopausal   Other Topics Concern  . Not on file   Social History Narrative     PHYSICAL EXAM  Filed Vitals:   08/05/15 1309  BP: 126/84  Pulse: 72  Resp: 14  Height: 5\' 8"  (1.727 m)  Weight: 188 lb 3.2 oz (85.367 kg)    Body mass index is 28.62 kg/(m^2).   General: The patient is well-developed and well-nourished and in no acute distress  Skin/Musculoskeletal:    She has moderate pedal edema on the left, mild on right.    Left knee has effusion.   There are no rashes.  Neurologic Exam  Mental status: The patient is alert and oriented x 3 at the time of the examination.     Cranial nerves: Extraocular movements are full.      Motor:  Muscle bulk is normal.   Tone is normal. Strength is  5 / 5 in all 4 extremities.   Sensory: Sensory testing is intact to pinprick, soft touch and vibration sensation in all 4 extremities.  Gait and station: Station is normal.   Gait is arthritic . Tandem gait is mildly wide.  Reflexes: Deep tendon reflexes are symmetric and normal bilaterally.       DIAGNOSTIC DATA (LABS, IMAGING, TESTING) - I reviewed patient records, labs, notes, testing and imaging myself where available.  Lab Results  Component Value Date   WBC 4.8 11/30/2014   HGB 10.3* 11/30/2014   HCT 32.9* 11/30/2014   MCV 92.9 11/30/2014   PLT 248 11/30/2014      Component Value Date/Time   NA 140 11/30/2014 2025   K 3.6 11/30/2014 2025   CL 109 11/30/2014 2025   CO2 22 11/30/2014 2025   GLUCOSE 141* 11/30/2014 2025   BUN 14 11/30/2014 2025   CREATININE 0.89 11/30/2014 2025   CALCIUM 9.0 11/30/2014 2025   PROT 7.2 06/14/2014 2000   ALBUMIN 3.3* 06/14/2014 2000   AST 40* 06/14/2014 2000   ALT 27 06/14/2014 2000   ALKPHOS 60 06/14/2014 2000   BILITOT 0.5 06/14/2014 2000   GFRNONAA >60 11/30/2014 2025   GFRAA >60 11/30/2014 2025       ASSESSMENT AND PLAN  Spondylolisthesis at L5-S1 level  Aseptic bony necrosis (HCC)  Dysesthesia  Knee pain, left    1.   Celebrex for knee pain (is on Plavix so will avoid other NSAID's) 2.    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   3.   Return to clinic in 6 months or sooner if there are new or worsening neurologic symptoms.  Babe Anthis A. Epimenio Foot, MD, PhD 08/05/2015, 1:13 PM Certified in Neurology, Clinical Neurophysiology, Sleep Medicine, Pain Medicine and Neuroimaging  [facility] [address], [address] [place], [place] [address] [phone]

## 2015-09-02 ENCOUNTER — Other Ambulatory Visit: Payer: Self-pay | Admitting: Neurology

## 2015-09-05 ENCOUNTER — Ambulatory Visit (HOSPITAL_BASED_OUTPATIENT_CLINIC_OR_DEPARTMENT_OTHER)
Admission: RE | Admit: 2015-09-05 | Discharge: 2015-09-05 | Disposition: A | Discharge status: Home / Self Care | Payer: Medicare Other | Source: Ambulatory Visit | Attending: Family Medicine | Admitting: Family Medicine

## 2015-09-05 DIAGNOSIS — Z1231 Encounter for screening mammogram for malignant neoplasm of breast: Secondary | ICD-10-CM | POA: Diagnosis not present

## 2015-09-16 ENCOUNTER — Emergency Department (HOSPITAL_BASED_OUTPATIENT_CLINIC_OR_DEPARTMENT_OTHER)
Admission: EM | Admit: 2015-09-16 | Discharge: 2015-09-16 | Disposition: A | Discharge status: Home / Self Care | Payer: Medicare Other | Attending: Emergency Medicine | Admitting: Emergency Medicine

## 2015-09-16 ENCOUNTER — Encounter (HOSPITAL_BASED_OUTPATIENT_CLINIC_OR_DEPARTMENT_OTHER): Payer: Self-pay | Admitting: *Deleted

## 2015-09-16 ENCOUNTER — Emergency Department (HOSPITAL_BASED_OUTPATIENT_CLINIC_OR_DEPARTMENT_OTHER): Payer: Medicare Other

## 2015-09-16 DIAGNOSIS — W108XXA Fall (on) (from) other stairs and steps, initial encounter: Secondary | ICD-10-CM | POA: Diagnosis not present

## 2015-09-16 DIAGNOSIS — I10 Essential (primary) hypertension: Secondary | ICD-10-CM | POA: Insufficient documentation

## 2015-09-16 DIAGNOSIS — Y998 Other external cause status: Secondary | ICD-10-CM | POA: Insufficient documentation

## 2015-09-16 DIAGNOSIS — M1990 Unspecified osteoarthritis, unspecified site: Secondary | ICD-10-CM | POA: Insufficient documentation

## 2015-09-16 DIAGNOSIS — S299XXA Unspecified injury of thorax, initial encounter: Secondary | ICD-10-CM | POA: Diagnosis present

## 2015-09-16 DIAGNOSIS — Z791 Long term (current) use of non-steroidal anti-inflammatories (NSAID): Secondary | ICD-10-CM | POA: Diagnosis not present

## 2015-09-16 DIAGNOSIS — E7800 Pure hypercholesterolemia, unspecified: Secondary | ICD-10-CM | POA: Diagnosis not present

## 2015-09-16 DIAGNOSIS — Z79899 Other long term (current) drug therapy: Secondary | ICD-10-CM | POA: Diagnosis not present

## 2015-09-16 DIAGNOSIS — Z87891 Personal history of nicotine dependence: Secondary | ICD-10-CM | POA: Diagnosis not present

## 2015-09-16 DIAGNOSIS — Z7902 Long term (current) use of antithrombotics/antiplatelets: Secondary | ICD-10-CM | POA: Diagnosis not present

## 2015-09-16 DIAGNOSIS — Z7982 Long term (current) use of aspirin: Secondary | ICD-10-CM | POA: Diagnosis not present

## 2015-09-16 DIAGNOSIS — Z91040 Latex allergy status: Secondary | ICD-10-CM | POA: Diagnosis not present

## 2015-09-16 DIAGNOSIS — Y9289 Other specified places as the place of occurrence of the external cause: Secondary | ICD-10-CM | POA: Insufficient documentation

## 2015-09-16 DIAGNOSIS — Y9301 Activity, walking, marching and hiking: Secondary | ICD-10-CM | POA: Diagnosis not present

## 2015-09-16 DIAGNOSIS — S20212A Contusion of left front wall of thorax, initial encounter: Secondary | ICD-10-CM | POA: Insufficient documentation

## 2015-09-16 MED ORDER — OXYCODONE-ACETAMINOPHEN 5-325 MG PO TABS: 1.0000 | ORAL_TABLET | Freq: Four times a day (QID) | ORAL | Status: DC | PRN

## 2015-09-16 NOTE — ED Notes (Signed)
Pt reports "sharp" pains beneath her left breast since falling last Thursday, pain has worsened, worst with position changes and lying down at night "I feel like I can't get a good breath in..." area is slightly tender to palp, ekg performed while pt being triaged.

## 2015-09-16 NOTE — Discharge Instructions (Signed)
Return to the ED with any concerns including difficulty breathing, fever/chills, fainting, decreased level of alertness/lethargy, or any other alarming symptoms  You should use the incentive spirometer 10 times every hour

## 2015-09-16 NOTE — ED Provider Notes (Signed)
CSN: [id number]     Arrival date & time 09/16/15  1710 History  By signing my name below, I, Linus Galas, attest that this documentation has been prepared under the direction and in the presence of No att. providers found. Electronically Signed: Linus Galas, ED Scribe. 09/17/2015. 7:10 PM.   Chief Complaint  Patient presents with  . Chest Pain  . Fall   The history is provided by the patient. No language interpreter was used.   HPI Comments: Sara Villanueva is a 70 y.o. female who presents to the Emergency Department with a PMHx of CAD, HTN, and HLD complaining of sharp chest pain under her left breast s/p fall 4 days ago. Pt states she was walking up a set of stairs when she missed a step and fell onto her left side. Since then, she reports worsening pain with position changes and lying down. Pt denies any difficulty breathing, cough, fever, or any other symptoms at this time.   Past Medical History  Diagnosis Date  . Carotid arterial disease (HCC)   . Hypertension   . High cholesterol   . Arthritis   . Rheumatoid arthritis [facility])    Past Surgical History  Procedure Laterality Date  . Coronary angioplasty with stent placement     Family History  Problem Relation Age of Onset  . Heart disease Mother   . Heart disease Father    Social History  Substance Use Topics  . Smoking status: Former Games developer  . Smokeless tobacco: Never Used  . Alcohol Use: No   OB History    No data available     Review of Systems  Constitutional: Negative for fever and chills.  Respiratory: Negative for cough and shortness of breath.   Cardiovascular: Positive for chest pain.  All other systems reviewed and are negative.  Allergies  Latex and Lamictal  Home Medications   Prior to Admission medications   Medication Sig Start Date End Date Taking? Authorizing Provider  aspirin 81 MG tablet Take 81 mg by mouth at bedtime.     Historical Provider, MD  BESIVANCE 0.6 % SUSP  07/24/14    Historical Provider, MD  busPIRone (BUSPAR) 15 MG tablet  08/01/14   Historical Provider, MD  celecoxib (CELEBREX) 200 MG capsule Take 1 capsule (200 mg total) by mouth 2 (two) times daily. 08/05/15   Asa Lente, MD  cetirizine (ZYRTEC) 10 MG tablet Take 10 mg by mouth at bedtime.    Historical Provider, MD  clopidogrel (PLAVIX) 75 MG tablet Take 75 mg by mouth daily.    Historical Provider, MD  ezetimibe (ZETIA) 10 MG tablet Take 10 mg by mouth at bedtime.     Historical Provider, MD  FLUZONE HIGH-DOSE 0.5 ML SUSY  01/23/15   Historical Provider, MD  folic acid (FOLVITE) 1 MG tablet Take 1 mg by mouth daily.    Historical Provider, MD  fosinopril (MONOPRIL) 40 MG tablet  05/14/14   Historical Provider, MD  gabapentin (NEURONTIN) 600 MG tablet TAKE ONE TABLET BY MOUTH THREE TIMES DAILY 09/02/15   Asa Lente, MD  halobetasol (ULTRAVATE) 0.05 % cream  07/05/14   Historical Provider, MD  hydrochlorothiazide (HYDRODIURIL) 25 MG tablet Take 1 tablet (25 mg total) by mouth daily. 07/14/15 08/11/15  Alvira Monday, MD  isosorbide mononitrate (IMDUR) 120 MG 24 hr tablet 120 mg.  07/21/14   Historical Provider, MD  Karlene Einstein 145 MCG CAPS capsule  07/04/14   Historical Provider, MD  LIVALO 2  MG TABS  07/12/14   Historical Provider, MD  methotrexate 2.5 MG tablet Take 2.5 mg by mouth once a week.    Historical Provider, MD  metoprolol (LOPRESSOR) 50 MG tablet  07/08/14   Historical Provider, MD  montelukast (SINGULAIR) 10 MG tablet Take 10 mg by mouth at bedtime.    Historical Provider, MD  NITROSTAT 0.4 MG SL tablet  07/08/14   Historical Provider, MD  oxyCODONE-acetaminophen (PERCOCET/ROXICET) 5-325 MG tablet Take 1-2 tablets by mouth every 6 (six) hours as needed for severe pain. 09/16/15   Jerelyn Scott, MD  torsemide (DEMADEX) 10 MG tablet Take 10 mg by mouth. 12/06/14   Historical Provider, MD   BP 175/82 mmHg  Pulse 74  Temp(Src) 97.7 F (36.5 C) (Oral)  Resp 18  Ht 5\' 8"  (1.727 m)  Wt 175 lb (79.379 kg)   BMI 26.61 kg/m2  SpO2 98%  Vitals reviewed Physical Exam  Physical Examination: General appearance - alert, well appearing, and in no distress Mental status - alert, oriented to person, place, and time Eyes - no conjunctival injection, no scleral icterus Chest - clear to auscultation, no wheezes, rales or rhonchi, symmetric air entry, ttp over left anterior ribs beneath left breast, no crepitus or stepoffs Heart - normal rate, regular rhythm, normal S1, S2, no murmurs, rubs, clicks or gallops Abdomen - soft, nontender, nondistended, no masses or organomegaly Back exam - full range of motion, no tenderness, palpable spasm or pain on motion Neurological - alert, oriented, normal speech, Musculoskeletal - no joint tenderness, deformity or swelling Extremities - peripheral pulses normal, no pedal edema, no clubbing or cyanosis Skin - normal coloration and turgor, no rashes,  ED Course  Procedures   DIAGNOSTIC STUDIES: Oxygen Saturation is 98% on room air, normal by my interpretation.    COORDINATION OF CARE: 7:05 PM Will order x-ray of left ribs. Will order EKG. Discussed treatment plan with pt including an incentive spirometer at bedside and pt agreed to plan.   Labs Review Labs Reviewed - No data to display  Imaging Review Dg Ribs Unilateral W/chest Left  09/16/2015  CLINICAL DATA:  Pain following fall 4 days prior EXAM: LEFT RIBS AND CHEST - 3+ VIEW COMPARISON:  None. FINDINGS: Frontal chest as well as oblique and cone-down lower rib images were obtained. Lungs are clear. Heart size and pulmonary vascularity are normal. There is atherosclerotic calcification in the aortic arch region. No adenopathy. There is no demonstrable pneumothorax or pleural effusion. There is no demonstrable rib fracture. There are renal artery stents bilaterally. IMPRESSION: No demonstrable rib fracture.  No edema or consolidation. Electronically Signed   By: 09/18/2015 III M.D.   On: 09/16/2015 17:51    I have personally reviewed and evaluated these images and lab results as part of my medical decision-making.   EKG Interpretation   Date/Time:  Monday September 16 2015 17:16:21 EDT Ventricular Rate:  85 PR Interval:  162 QRS Duration: 84 QT Interval:  380 QTC Calculation: 452 R Axis:   68 Text Interpretation:  Normal sinus rhythm Right atrial enlargement Left  ventricular hypertrophy with repolarization abnormality Abnormal ECG No  significant change since last tracing Confirmed by BEATON  MD, ROBERT  ([id number]) on 09/17/2015 10:27:52 AM      MDM   Final diagnoses:  Rib contusion, left, initial encounter    Pt presenting with c/o pain in left anterior ribs after fall.  No difficulty breathing.  No other areas of injury or pain.  Did not strike head. Pain is worse with deep breathing and palpation.  Xray does not show rib fracture- will treat clinically as rib fracture/contusion.  Pt given incentive spirometer as well as pain control.  No PTX or HTX on xray.  Discharged with strict return precautions.  Pt agreeable with plan.  I personally performed the services described in this documentation, which was scribed in my presence. The recorded information has been reviewed and is accurate.     Jerelyn Scott, MD 09/17/15 [phone]

## 2015-11-25 ENCOUNTER — Other Ambulatory Visit: Payer: Self-pay | Admitting: Neurology

## 2016-01-31 DIAGNOSIS — D649 Anemia, unspecified: Secondary | ICD-10-CM | POA: Insufficient documentation

## 2016-02-05 ENCOUNTER — Ambulatory Visit: Payer: Medicare Other | Admitting: Neurology

## 2016-02-14 ENCOUNTER — Encounter: Payer: Self-pay | Admitting: Neurology

## 2016-02-14 ENCOUNTER — Ambulatory Visit (INDEPENDENT_AMBULATORY_CARE_PROVIDER_SITE_OTHER): Payer: Medicare Other | Admitting: Neurology

## 2016-02-14 VITALS — BP 170/76 | HR 76 | Resp 18 | Ht 68.0 in | Wt 174.5 lb

## 2016-02-14 DIAGNOSIS — M069 Rheumatoid arthritis, unspecified: Secondary | ICD-10-CM | POA: Diagnosis not present

## 2016-02-14 DIAGNOSIS — M4317 Spondylolisthesis, lumbosacral region: Secondary | ICD-10-CM

## 2016-02-14 DIAGNOSIS — R208 Other disturbances of skin sensation: Secondary | ICD-10-CM

## 2016-02-14 DIAGNOSIS — M5430 Sciatica, unspecified side: Secondary | ICD-10-CM | POA: Diagnosis not present

## 2016-02-14 MED ORDER — ACETAMINOPHEN-CODEINE #3 300-30 MG PO TABS: 1.0000 | ORAL_TABLET | Freq: Every day | ORAL | 0 refills | Status: DC | PRN

## 2016-02-14 NOTE — Progress Notes (Signed)
[facility]  PATIENT: Sara Villanueva DOB: 1945/11/28  REFERRING DOCTOR OR PCP:  Olivia Mackie SOURCE: patient  _________________________________   HISTORICAL  CHIEF COMPLAINT:  Chief Complaint  Patient presents with  . Back Pain    Sts. non-radiating lbp began to worsen last week. Sts. she was hospitalized  for HTN and Celebrex was d/c while she was in the hospital.  She had a cardiac cath and had stent placement.  Sts. Plavix was also d/c and she was started on Brilenta./fim    HISTORY OF PRESENT ILLNESS:  She is a 70 yo woman with chronic lower back pain, dysesthesia and generalized joint pain.   She was recently hospitalized   Dysesthesia:    She feels the painful tingling is doing better.  She is on gabapentin    Pain is not too bad now and tingling pain was worse in the past.     LBP   She has RA.   She reports more LBP.   This is a chronic problem and fluctuates    X-ray of her back (personally reviewed) showed spondylolisthesis at L5-S1.    She has never had surgery in her back in the past.   In the past she has received some benefit from [facility], TPI and medrol dose packs.    She is not getting much benefit from gabapentin.   Tramadol has not helped.     Generalized joint pain/RA:   She has RA.   She was on methotrexate and now is on an IV q 8 wks (Remicade?).   Pain was worse in  left knee pain but is better now. Rheumatologist is Dr. Dierdre Forth. She has some neck pain but that is bothering her much less currently.  REVIEW OF SYSTEMS: Constitutional: No fevers, chills, sweats, or change in appetite Eyes: No visual changes, double vision, eye pain Ear, nose and throat: No hearing loss, ear pain, nasal congestion, sore throat Cardiovascular: No chest pain, palpitations Respiratory: No shortness of breath at rest or with exertion.   No wheezes GastrointestinaI: No nausea, vomiting, diarrhea, abdominal pain, fecal incontinence Genitourinary: No dysuria, urinary  retention or frequency.  No nocturia. Musculoskeletal: Minimal neck pain; moderate back pain and left hip pain Integumentary: No rash, pruritus, skin lesions Neurological: as above Psychiatric: No depression at this time.  No anxiety Endocrine: No palpitations, diaphoresis, change in appetite, change in weigh or increased thirst Hematologic/Lymphatic: No anemia, purpura, petechiae. Allergic/Immunologic: No itchy/runny eyes, nasal congestion, recent allergic reactions, rashes  ALLERGIES: Allergies  Allergen Reactions  . Latex   . Lamictal [Lamotrigine] Rash    HOME MEDICATIONS:  Current Outpatient Prescriptions:  .  amLODipine-valsartan (EXFORGE) 5-320 MG tablet, Take by mouth., Disp: , Rfl:  .  aspirin 81 MG tablet, Take 81 mg by mouth at bedtime. , Disp: , Rfl:  .  BESIVANCE 0.6 % SUSP, , Disp: , Rfl:  .  busPIRone (BUSPAR) 15 MG tablet, , Disp: , Rfl:  .  cetirizine (ZYRTEC) 10 MG tablet, Take 10 mg by mouth at bedtime., Disp: , Rfl:  .  ezetimibe (ZETIA) 10 MG tablet, Take 10 mg by mouth at bedtime. , Disp: , Rfl:  .  FLUZONE HIGH-DOSE 0.5 ML SUSY, , Disp: , Rfl:  .  folic acid (FOLVITE) 1 MG tablet, Take 1 mg by mouth daily., Disp: , Rfl:  .  fosinopril (MONOPRIL) 40 MG tablet, , Disp: , Rfl:  .  gabapentin (NEURONTIN) 600 MG tablet, TAKE ONE TABLET BY  MOUTH THREE TIMES DAILY, Disp: 90 tablet, Rfl: 11 .  halobetasol (ULTRAVATE) 0.05 % cream, , Disp: , Rfl:  .  isosorbide mononitrate (IMDUR) 120 MG 24 hr tablet, 120 mg. , Disp: , Rfl:  .  LINZESS 145 MCG CAPS capsule, , Disp: , Rfl:  .  LIVALO 2 MG TABS, , Disp: , Rfl:  .  NITROSTAT 0.4 MG SL tablet, , Disp: , Rfl:  .  acetaminophen-codeine (TYLENOL #3) 300-30 MG tablet, Take 1 tablet by mouth daily as needed for moderate pain., Disp: 30 tablet, Rfl: 0 .  hydrochlorothiazide (HYDRODIURIL) 25 MG tablet, Take 1 tablet (25 mg total) by mouth daily., Disp: 30 tablet, Rfl: 0 .  ticagrelor (BRILINTA) 90 MG TABS tablet, Take 90 mg  by mouth., Disp: , Rfl:   PAST MEDICAL HISTORY: Past Medical History:  Diagnosis Date  . Arthritis   . Carotid arterial disease (HCC)   . High cholesterol   . Hypertension   . Rheumatoid arthritis (HCC)     PAST SURGICAL HISTORY: Past Surgical History:  Procedure Laterality Date  . CORONARY ANGIOPLASTY WITH STENT PLACEMENT      FAMILY HISTORY: Family History  Problem Relation Age of Onset  . Heart disease Mother   . Heart disease Father     SOCIAL HISTORY:  Social History   Social History  . Marital status: Married    Spouse name: N/A  . Number of children: N/A  . Years of education: N/A   Occupational History  . Not on file.   Social History Main Topics  . Smoking status: Former Games developer  . Smokeless tobacco: Never Used  . Alcohol use No  . Drug use: No  . Sexual activity: Yes    Birth control/ protection: Post-menopausal   Other Topics Concern  . Not on file   Social History Narrative  . No narrative on file     PHYSICAL EXAM  Vitals:   02/14/16 1105  BP: (!) 170/76  Pulse: 76  Resp: 18  Weight: 174 lb 8 oz (79.2 kg)  Height: 5\' 8"  (1.727 m)    Body mass index is 26.53 kg/m.   General: The patient is well-developed and well-nourished and in no acute distress  Skin/Musculoskeletal:    She has pedal edema L>R. tender over the L5-S1 paraspinal muscles. Reduced range of motion in lower back due to pain..   There are no rashes.  Neurologic Exam  Mental status: The patient is alert and oriented x 3 at the time of the examination.    Cranial nerves: Extraocular movements are full.      Motor:  Muscle bulk is normal.   Tone is normal. Strength is  5 / 5 in all 4 extremities.   Sensory: Sensory testing is intact to pinprick, soft touch and vibration sensation in all 4 extremities.  Gait and station: Station is normal.   Gait is arthritic . Tandem gait is mildly wide.  Reflexes: Deep tendon reflexes are symmetric and normal bilaterally.        DIAGNOSTIC DATA (LABS, IMAGING, TESTING) - I reviewed patient records, labs, notes, testing and imaging myself where available.  Lab Results  Component Value Date   WBC 4.8 11/30/2014   HGB 10.3 (L) 11/30/2014   HCT 32.9 (L) 11/30/2014   MCV 92.9 11/30/2014   PLT 248 11/30/2014      Component Value Date/Time   NA 140 11/30/2014 2025   K 3.6 11/30/2014 2025   CL 109 11/30/2014  2025   CO2 22 11/30/2014 2025   GLUCOSE 141 (H) 11/30/2014 2025   BUN 14 11/30/2014 2025   CREATININE 0.89 11/30/2014 2025   CALCIUM 9.0 11/30/2014 2025   PROT 7.2 06/14/2014 2000   ALBUMIN 3.3 (L) 06/14/2014 2000   AST 40 (H) 06/14/2014 2000   ALT 27 06/14/2014 2000   ALKPHOS 60 06/14/2014 2000   BILITOT 0.5 06/14/2014 2000   GFRNONAA >60 11/30/2014 2025   GFRAA >60 11/30/2014 2025       ASSESSMENT AND PLAN  Neuralgia neuritis, sciatic nerve, unspecified laterality  Spondylolisthesis at L5-S1 level  Dysesthesia   1.   She is on Plavix and has CAD so will avoid NSAID's.  4 "typical" pain, she should take Tylenol. I also wrote her for #30 Tylenol 3's that she could take if the pain is especially bad. If back pain persists, we could consider referral for medial branch blocks followed by RF if appropriate. 2.    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   Her last injection with me was 1 year ago and she has not had any injections by orthopedics or rheumatology since at least 6 months. Since pain has increased quite a bit, I did a trigger point injection with 5 mL Marcaine and 40 mg Depo-Medrol into the L4-L5-S1 paraspinal muscles. She tolerated the procedure well and there were no complications.   3.   Return to clinic in 6 months or sooner if there are new or worsening neurologic symptoms.  Richard A. Epimenio Foot, MD, PhD 02/14/2016, 11:31 AM Certified in Neurology, Clinical Neurophysiology, Sleep Medicine, Pain Medicine and Neuroimaging  [facility] [address], [address] [place], [place] [address] [phone]

## 2016-02-27 ENCOUNTER — Emergency Department (HOSPITAL_BASED_OUTPATIENT_CLINIC_OR_DEPARTMENT_OTHER)
Admission: EM | Admit: 2016-02-27 | Discharge: 2016-02-27 | Disposition: A | Discharge status: Home / Self Care | Payer: Medicare Other | Attending: Emergency Medicine | Admitting: Emergency Medicine

## 2016-02-27 ENCOUNTER — Encounter (HOSPITAL_BASED_OUTPATIENT_CLINIC_OR_DEPARTMENT_OTHER): Payer: Self-pay | Admitting: Emergency Medicine

## 2016-02-27 ENCOUNTER — Emergency Department (HOSPITAL_BASED_OUTPATIENT_CLINIC_OR_DEPARTMENT_OTHER): Payer: Medicare Other

## 2016-02-27 DIAGNOSIS — I10 Essential (primary) hypertension: Secondary | ICD-10-CM | POA: Diagnosis not present

## 2016-02-27 DIAGNOSIS — Z79899 Other long term (current) drug therapy: Secondary | ICD-10-CM | POA: Diagnosis not present

## 2016-02-27 DIAGNOSIS — M79671 Pain in right foot: Secondary | ICD-10-CM | POA: Diagnosis not present

## 2016-02-27 DIAGNOSIS — Z87891 Personal history of nicotine dependence: Secondary | ICD-10-CM | POA: Diagnosis not present

## 2016-02-27 MED ORDER — HYDROCODONE-ACETAMINOPHEN 5-325 MG PO TABS: 1.0000 | ORAL_TABLET | Freq: Four times a day (QID) | ORAL | 0 refills | Status: DC | PRN

## 2016-02-27 MED FILL — HYDROCODON-APAP 5-325: 5-325 | 2 days supply | Qty: 10 | Fill #0

## 2016-02-27 NOTE — ED Provider Notes (Signed)
[facility] Provider Note   CSN: [id number] Arrival date & time: 02/27/16  0757     History   Chief Complaint Chief Complaint  Patient presents with  . Foot Pain    HPI Sara Villanueva is a 70 y.o. female.  The patient awoke with right ankle pain. No fall or injury. Patient has a history of arthritis but does not remind her of that. Patient states that the pain is 8 out of 10. Goody powder which improved it some. No other complaints.      Past Medical History:  Diagnosis Date  . Arthritis   . Carotid arterial disease (HCC)   . High cholesterol   . Hypertension   . Rheumatoid arthritis [facility])     Patient Active Problem List   Diagnosis Date Noted  . Knee pain, left 08/05/2015  . Allergic rhinitis 07/15/2015  . Carotid artery narrowing 07/15/2015  . Peripheral vascular disease (HCC) 07/15/2015  . Renal artery stenosis (HCC) 07/15/2015  . 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 Benign essential HTN 06/13/2015  . HLD (hyperlipidemia) 06/13/2015  . Rheumatoid arthritis (HCC) 06/13/2015  . Urgency of micturation 05/03/2015  . FOM (frequency of micturition) 05/03/2015  . Pedal edema 12/04/2014  . Dysesthesia 12/04/2014  . Nuclear sclerotic cataract 09/20/2014  . Lumbosacral radiculopathy 08/16/2014  . Spondylolisthesis at L5-S1 level 08/07/2014  . Neck pain 08/07/2014  . Chronic rheumatic arthritis (HCC) 08/07/2014  . Neuralgia neuritis, sciatic nerve 08/03/2014  . Aseptic bony necrosis (HCC) 08/03/2014  . Aseptic necrosis of bone (HCC) 08/03/2014    Past Surgical History:  Procedure Laterality Date  . CORONARY ANGIOPLASTY WITH STENT PLACEMENT      OB History    No data available       Home Medications    Prior to Admission medications   Medication Sig Start Date End Date Taking? Authorizing Provider  acetaminophen-codeine (TYLENOL #3) 300-30 MG tablet Take 1 tablet by mouth daily as needed for moderate pain. 02/14/16   Asa Lente, MD    amLODipine-valsartan Carlene Coria) 5-320 MG tablet Take by mouth. 01/08/16 01/07/17  Historical Provider, MD  aspirin 81 MG tablet Take 81 mg by mouth at bedtime.     Historical Provider, MD  BESIVANCE 0.6 % SUSP  07/24/14   Historical Provider, MD  busPIRone (BUSPAR) 15 MG tablet  08/01/14   Historical Provider, MD  cetirizine (ZYRTEC) 10 MG tablet Take 10 mg by mouth at bedtime.    Historical Provider, MD  ezetimibe (ZETIA) 10 MG tablet Take 10 mg by mouth at bedtime.     Historical Provider, MD  FLUZONE HIGH-DOSE 0.5 ML SUSY  01/23/15   Historical Provider, MD  folic acid (FOLVITE) 1 MG tablet Take 1 mg by mouth daily.    Historical Provider, MD  fosinopril (MONOPRIL) 40 MG tablet  05/14/14   Historical Provider, MD  gabapentin (NEURONTIN) 600 MG tablet TAKE ONE TABLET BY MOUTH THREE TIMES DAILY 09/02/15   Asa Lente, MD  halobetasol (ULTRAVATE) 0.05 % cream  07/05/14   Historical Provider, MD  hydrochlorothiazide (HYDRODIURIL) 25 MG tablet Take 1 tablet (25 mg total) by mouth daily. 07/14/15 08/11/15  Alvira Monday, MD  HYDROcodone-acetaminophen (NORCO/VICODIN) 5-325 MG tablet Take 1-2 tablets by mouth every 6 (six) hours as needed for moderate pain. 02/27/16   Vanetta Mulders, MD  isosorbide mononitrate (IMDUR) 120 MG 24 hr tablet 120 mg.  07/21/14   Historical Provider, MD  LINZESS 145 MCG CAPS capsule  07/04/14  Historical Provider, MD  LIVALO 2 MG TABS  07/12/14   Historical Provider, MD  NITROSTAT 0.4 MG SL tablet  07/08/14   Historical Provider, MD  ticagrelor (BRILINTA) 90 MG TABS tablet Take 90 mg by mouth.    Historical Provider, MD    Family History Family History  Problem Relation Age of Onset  . Heart disease Mother   . Heart disease Father     Social History Social History  Substance Use Topics  . Smoking status: Former Games developer  . Smokeless tobacco: Never Used  . Alcohol use No     Allergies   Latex and Lamictal [lamotrigine]   Review of Systems Review of Systems   Constitutional: Negative for fever.  Eyes: Negative for redness.  Respiratory: Negative for shortness of breath.   Cardiovascular: Negative for chest pain.  Gastrointestinal: Negative for abdominal pain.  Musculoskeletal: Positive for arthralgias.  Skin: Negative for rash.  Neurological: Negative for headaches.  Hematological: Does not bruise/bleed easily.  Psychiatric/Behavioral: Negative for confusion.     Physical Exam Updated Vital Signs BP 174/95   Pulse 67   Temp 97.5 F (36.4 C)   Resp 16   Ht 5\' 8"  (1.727 m)   Wt 78.9 kg   SpO2 100%   BMI 26.46 kg/m   Physical Exam  Constitutional: She is oriented to person, place, and time. She appears well-developed and well-nourished. No distress.  HENT:  Head: Normocephalic and atraumatic.  Mouth/Throat: Oropharynx is clear and moist.  Eyes: Conjunctivae and EOM are normal. Pupils are equal, round, and reactive to light.  Neck: Normal range of motion.  Cardiovascular: Normal rate and regular rhythm.   Pulmonary/Chest: Effort normal and breath sounds normal. No respiratory distress.  Abdominal: Soft. Bowel sounds are normal. There is no tenderness.  Musculoskeletal: Normal range of motion. She exhibits no edema, tenderness or deformity.  Right ankle without any specific abnormalities. Refill to the toes is 2 seconds or less. No tenderness to the forefoot and no tenderness to the knee no proximal leg tenderness.  Neurological: She is alert and oriented to person, place, and time. She displays normal reflexes. No cranial nerve deficit. She exhibits normal muscle tone.  Skin: Skin is warm. Capillary refill takes less than 2 seconds. No rash noted.  Nursing note and vitals reviewed.    ED Treatments / Results  Labs (all labs ordered are listed, but only abnormal results are displayed) Labs Reviewed - No data to display  EKG  EKG Interpretation None       Radiology Dg Ankle Complete Right  Result Date:  02/27/2016 CLINICAL DATA:  Woke up with right ankle pain. EXAM: RIGHT ANKLE - COMPLETE 3+ VIEW COMPARISON:  None. FINDINGS: There is no evidence of fracture, dislocation, or joint effusion. There is no evidence of arthropathy or other focal bone abnormality. Soft tissues are unremarkable. IMPRESSION: Negative. Electronically Signed   By: 02/29/2016 M.D.   On: 02/27/2016 08:42    Procedures Procedures (including critical care time)  Medications Ordered in ED Medications - No data to display   Initial Impression / Assessment and Plan / ED Course  I have reviewed the triage vital signs and the nursing notes.  Pertinent labs & imaging results that were available during my care of the patient were reviewed by me and considered in my medical decision making (see chart for details).  Clinical Course    X-rays of the right ankle without any bony abnormalities. Will treat symptomatically.  Final Clinical Impressions(s) / ED Diagnoses   Final diagnoses:  Foot pain, right    New Prescriptions New Prescriptions   HYDROCODONE-ACETAMINOPHEN (NORCO/VICODIN) 5-325 MG TABLET    Take 1-2 tablets by mouth every 6 (six) hours as needed for moderate pain.     Vanetta Mulders, MD 02/27/16 [phone]

## 2016-02-27 NOTE — Discharge Instructions (Signed)
X-ray of the right ankle without any bony abnormality. Take hydrocodone as needed for pain. Follow-up with your doctor if not improved. Return for any new or worse symptoms.

## 2016-02-27 NOTE — ED Triage Notes (Signed)
Right foot pain upon wakening.  No known injury

## 2016-04-02 ENCOUNTER — Emergency Department (HOSPITAL_BASED_OUTPATIENT_CLINIC_OR_DEPARTMENT_OTHER)
Admission: EM | Admit: 2016-04-02 | Discharge: 2016-04-02 | Disposition: A | Discharge status: Home / Self Care | Payer: Medicare Other | Attending: Emergency Medicine | Admitting: Emergency Medicine

## 2016-04-02 ENCOUNTER — Encounter (HOSPITAL_BASED_OUTPATIENT_CLINIC_OR_DEPARTMENT_OTHER): Payer: Self-pay | Admitting: *Deleted

## 2016-04-02 DIAGNOSIS — M791 Myalgia: Secondary | ICD-10-CM | POA: Insufficient documentation

## 2016-04-02 DIAGNOSIS — Z7982 Long term (current) use of aspirin: Secondary | ICD-10-CM | POA: Insufficient documentation

## 2016-04-02 DIAGNOSIS — Z87891 Personal history of nicotine dependence: Secondary | ICD-10-CM | POA: Diagnosis not present

## 2016-04-02 DIAGNOSIS — R0789 Other chest pain: Secondary | ICD-10-CM | POA: Insufficient documentation

## 2016-04-02 DIAGNOSIS — I10 Essential (primary) hypertension: Secondary | ICD-10-CM | POA: Diagnosis not present

## 2016-04-02 DIAGNOSIS — Z79899 Other long term (current) drug therapy: Secondary | ICD-10-CM | POA: Diagnosis not present

## 2016-04-02 MED ORDER — NAPROXEN 250 MG PO TABS
500.0000 mg | ORAL_TABLET | Freq: Once | ORAL | Status: AC
  Administered 2016-04-02: 500 mg via ORAL
  Filled 2016-04-02: qty 2

## 2016-04-02 NOTE — ED Provider Notes (Signed)
[facility] Provider Note   CSN: [id number] Arrival date & time: 04/02/16  1212     History   Chief Complaint Chief Complaint  Patient presents with  . Shoulder Pain    HPI Sara Villanueva is a 70 y.o. female.  70 yo F with a chief complaint of left-sided chest wall tenderness. This is worse when she turns her head to the left. This been going on for about a week and half. She denies any injury. Denies fevers or chills. Denies exertional symptoms. She denies shortness of breath or diaphoresis nausea or vomiting. Has tried Tylenol without relief for this.   The history is provided by the patient.  Shoulder Pain   This is a new problem. The current episode started less than 1 hour ago. The problem occurs constantly. The problem has not changed since onset.The pain is present in the neck (chest wall). The quality of the pain is described as sharp. The pain is at a severity of 6/10. The pain is moderate. Associated symptoms include limited range of motion. Treatments tried: tylenol. The treatment provided no relief.    Past Medical History:  Diagnosis Date  . Arthritis   . Carotid arterial disease (HCC)   . High cholesterol   . Hypertension   . Rheumatoid arthritis [facility])     Patient Active Problem List   Diagnosis Date Noted  . Knee pain, left 08/05/2015  . Allergic rhinitis 07/15/2015  . Carotid artery narrowing 07/15/2015  . Peripheral vascular disease (HCC) 07/15/2015  . Renal artery stenosis (HCC) 07/15/2015  . 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 Benign essential HTN 06/13/2015  . HLD (hyperlipidemia) 06/13/2015  . Rheumatoid arthritis (HCC) 06/13/2015  . Urgency of micturation 05/03/2015  . FOM (frequency of micturition) 05/03/2015  . Pedal edema 12/04/2014  . Dysesthesia 12/04/2014  . Nuclear sclerotic cataract 09/20/2014  . Lumbosacral radiculopathy 08/16/2014  . Spondylolisthesis at L5-S1 level 08/07/2014  . Neck pain 08/07/2014  . Chronic  rheumatic arthritis (HCC) 08/07/2014  . Neuralgia neuritis, sciatic nerve 08/03/2014  . Aseptic bony necrosis (HCC) 08/03/2014  . Aseptic necrosis of bone (HCC) 08/03/2014    Past Surgical History:  Procedure Laterality Date  . CORONARY ANGIOPLASTY WITH STENT PLACEMENT      OB History    No data available       Home Medications    Prior to Admission medications   Medication Sig Start Date End Date Taking? Authorizing Provider  acetaminophen-codeine (TYLENOL #3) 300-30 MG tablet Take 1 tablet by mouth daily as needed for moderate pain. 02/14/16  Yes Asa Lente, MD  amLODipine-valsartan Carlene Coria) 5-320 MG tablet Take by mouth. 01/08/16 01/07/17 Yes Historical Provider, MD  aspirin 81 MG tablet Take 81 mg by mouth at bedtime.    Yes Historical Provider, MD  busPIRone (BUSPAR) 15 MG tablet  08/01/14  Yes Historical Provider, MD  ezetimibe (ZETIA) 10 MG tablet Take 10 mg by mouth at bedtime.    Yes Historical Provider, MD  folic acid (FOLVITE) 1 MG tablet Take 1 mg by mouth daily.   Yes Historical Provider, MD  gabapentin (NEURONTIN) 600 MG tablet TAKE ONE TABLET BY MOUTH THREE TIMES DAILY 09/02/15  Yes Asa Lente, MD  hydrochlorothiazide (HYDRODIURIL) 25 MG tablet Take 1 tablet (25 mg total) by mouth daily. 07/14/15 04/02/16 Yes Alvira Monday, MD  LINZESS 145 MCG CAPS capsule  07/04/14  Yes Historical Provider, MD  LIVALO 2 MG TABS  07/12/14  Yes Historical Provider,  MD  NITROSTAT 0.4 MG SL tablet  07/08/14  Yes Historical Provider, MD  ticagrelor (BRILINTA) 90 MG TABS tablet Take 90 mg by mouth.   Yes Historical Provider, MD  BESIVANCE 0.6 % SUSP  07/24/14   Historical Provider, MD  cetirizine (ZYRTEC) 10 MG tablet Take 10 mg by mouth at bedtime.    Historical Provider, MD  FLUZONE HIGH-DOSE 0.5 ML SUSY  01/23/15   Historical Provider, MD  fosinopril (MONOPRIL) 40 MG tablet  05/14/14   Historical Provider, MD  halobetasol (ULTRAVATE) 0.05 % cream  07/05/14   Historical Provider, MD    HYDROcodone-acetaminophen (NORCO/VICODIN) 5-325 MG tablet Take 1-2 tablets by mouth every 6 (six) hours as needed for moderate pain. 02/27/16   Vanetta Mulders, MD  isosorbide mononitrate (IMDUR) 120 MG 24 hr tablet 120 mg.  07/21/14   Historical Provider, MD    Family History Family History  Problem Relation Age of Onset  . Heart disease Mother   . Heart disease Father     Social History Social History  Substance Use Topics  . Smoking status: Former Games developer  . Smokeless tobacco: Never Used  . Alcohol use No     Allergies   Latex and Lamictal [lamotrigine]   Review of Systems Review of Systems  Constitutional: Negative for chills and fever.  HENT: Negative for congestion and rhinorrhea.   Eyes: Negative for redness and visual disturbance.  Respiratory: Negative for shortness of breath and wheezing.   Cardiovascular: Positive for chest pain (when she turns her neck to the left). Negative for palpitations.  Gastrointestinal: Negative for nausea and vomiting.  Genitourinary: Negative for dysuria and urgency.  Musculoskeletal: Positive for arthralgias and myalgias.  Skin: Negative for pallor and wound.  Neurological: Negative for dizziness and headaches.     Physical Exam Updated Vital Signs BP 119/64   Pulse 76   Temp 98.2 F (36.8 C) (Oral)   Resp 15   Ht 5\' 8"  (1.727 m)   Wt 173 lb (78.5 kg)   SpO2 99%   BMI 26.30 kg/m   Physical Exam  Constitutional: She is oriented to person, place, and time. She appears well-developed and well-nourished. No distress.  HENT:  Head: Normocephalic and atraumatic.  No midline spinal tenderness. Mild tenderness to the trapezius at the attachment to the shoulder.  Eyes: EOM are normal. Pupils are equal, round, and reactive to light.  Neck: Normal range of motion. Neck supple.  Cardiovascular: Normal rate and regular rhythm.  Exam reveals no gallop and no friction rub.   No murmur heard. Pulmonary/Chest: Effort normal. She has  no wheezes. She has no rales. She exhibits tenderness (tender palpation along the left lateral chest wall. Reproduces her symptoms.).  Abdominal: Soft. She exhibits no distension. There is no tenderness.  Musculoskeletal: She exhibits no edema or tenderness.  Neurological: She is alert and oriented to person, place, and time.  Skin: Skin is warm and dry. She is not diaphoretic.  Psychiatric: She has a normal mood and affect. Her behavior is normal.  Nursing note and vitals reviewed.    ED Treatments / Results  Labs (all labs ordered are listed, but only abnormal results are displayed) Labs Reviewed - No data to display  EKG  EKG Interpretation  Date/Time:  Thursday April 02 2016 12:28:48 EDT Ventricular Rate:  68 PR Interval:    QRS Duration: 88 QT Interval:  394 QTC Calculation: 419 R Axis:   45 Text Interpretation:  Sinus rhythm Left atrial  enlargement Probable left ventricular hypertrophy No significant change since last tracing Confirmed by Haddy Mullinax MD, DANIEL [phone]) on 04/02/2016 12:36:12 PM       Radiology No results found.  Procedures Procedures (including critical care time)  Medications Ordered in ED Medications  naproxen (NAPROSYN) tablet 500 mg (500 mg Oral Given 04/02/16 1251)     Initial Impression / Assessment and Plan / ED Course  I have reviewed the triage vital signs and the nursing notes.  Pertinent labs & imaging results that were available during my care of the patient were reviewed by me and considered in my medical decision making (see chart for details).  Clinical Course    70 yo F With left-sided chest wall tenderness. This is reproducible on exam. She has an EKG is unremarkable. This is most likely musculoskeletal in nature. Will treat with Tylenol and NSAIDs. PCP follow-up.  1:01 PM:  I have discussed the diagnosis/risks/treatment options with the patient and believe the pt to be eligible for discharge home to follow-up with PCP. We also  discussed returning to the ED immediately if new or worsening sx occur. We discussed the sx which are most concerning (e.g., sudden worsening pain, fever, inability to tolerate by mouth) that necessitate immediate return. Medications administered to the patient during their visit and any new prescriptions provided to the patient are listed below.  Medications given during this visit Medications  naproxen (NAPROSYN) tablet 500 mg (500 mg Oral Given 04/02/16 1251)     The patient appears reasonably screen and/or stabilized for discharge and I doubt any other medical condition or other [facility] requiring further screening, evaluation, or treatment in the ED at this time prior to discharge.    Final Clinical Impressions(s) / ED Diagnoses   Final diagnoses:  Left-sided chest wall pain    New Prescriptions Discharge Medication List as of 04/02/2016 12:41 PM       Melene Plan, DO 04/02/16 1301

## 2016-04-02 NOTE — ED Triage Notes (Signed)
Pain in her left shoulder with radiation into her left chest when she turns her head to the left. It started 2 weeks ago.

## 2016-04-02 NOTE — Discharge Instructions (Signed)
Take 4 over the counter ibuprofen tablets 3 times a day or 2 over-the-counter naproxen tablets twice a day for pain. Also take tylenol 1000mg(2 extra strength) four times a day.    

## 2016-06-21 ENCOUNTER — Encounter (HOSPITAL_BASED_OUTPATIENT_CLINIC_OR_DEPARTMENT_OTHER): Payer: Self-pay | Admitting: *Deleted

## 2016-06-21 ENCOUNTER — Emergency Department (HOSPITAL_BASED_OUTPATIENT_CLINIC_OR_DEPARTMENT_OTHER)
Admission: EM | Admit: 2016-06-21 | Discharge: 2016-06-21 | Disposition: A | Discharge status: Home / Self Care | Payer: Medicare Other | Attending: Emergency Medicine | Admitting: Emergency Medicine

## 2016-06-21 DIAGNOSIS — Z7982 Long term (current) use of aspirin: Secondary | ICD-10-CM | POA: Diagnosis not present

## 2016-06-21 DIAGNOSIS — Z79899 Other long term (current) drug therapy: Secondary | ICD-10-CM | POA: Diagnosis not present

## 2016-06-21 DIAGNOSIS — I10 Essential (primary) hypertension: Secondary | ICD-10-CM | POA: Insufficient documentation

## 2016-06-21 DIAGNOSIS — M79642 Pain in left hand: Secondary | ICD-10-CM | POA: Diagnosis not present

## 2016-06-21 DIAGNOSIS — M25542 Pain in joints of left hand: Secondary | ICD-10-CM

## 2016-06-21 DIAGNOSIS — Z87891 Personal history of nicotine dependence: Secondary | ICD-10-CM | POA: Insufficient documentation

## 2016-06-21 DIAGNOSIS — M25541 Pain in joints of right hand: Secondary | ICD-10-CM

## 2016-06-21 DIAGNOSIS — M79641 Pain in right hand: Secondary | ICD-10-CM | POA: Insufficient documentation

## 2016-06-21 HISTORY — DX: Rheumatoid vasculitis with rheumatoid arthritis of unspecified site: M05.20

## 2016-06-21 MED ORDER — PREDNISONE 10 MG PO TABS: 10.0000 mg | ORAL_TABLET | Freq: Every day | ORAL | 0 refills | Status: DC

## 2016-06-21 NOTE — ED Provider Notes (Signed)
[facility] Provider Note   CSN: [id number] Arrival date & time: 06/21/16  0803     History   Chief Complaint Chief Complaint  Patient presents with  . Arthritis    HPI Sara Villanueva is a 71 y.o. female.  The history is provided by the patient. No language interpreter was used.  Arthritis     Sara Villanueva is a 71 y.o. female who presents to the Emergency Department complaining of joint pain.  She has a history of rheumatoid arthritis and is currently receiving infusions for her joint pain. She was on methotrexate 3 months ago but this was discontinued due to hair loss. Over the last 2-1/2 weeks she has had increased swelling and pain in her PIP joints of bilateral hands. She also endorses tingling sensation in her fingertips throughout bilateral hands. No fevers, neck pain, weakness, chest pain, shortness of breath. She saw her rheumatologist and a half weeks ago and received a one-time steroid shot. She had no improvement in her symptoms and presents today due to ongoing joint discomfort.  Past Medical History:  Diagnosis Date  . Arthritis   . Carotid arterial disease (HCC)   . High cholesterol   . Hypertension   . Rheumatoid arteritis   . Rheumatoid arthritis [facility])     Patient Active Problem List   Diagnosis Date Noted  . Knee pain, left 08/05/2015  . Allergic rhinitis 07/15/2015  . Carotid artery narrowing 07/15/2015  . Peripheral vascular disease (HCC) 07/15/2015  . Renal artery stenosis (HCC) 07/15/2015  . 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 Benign essential HTN 06/13/2015  . HLD (hyperlipidemia) 06/13/2015  . Rheumatoid arthritis (HCC) 06/13/2015  . Urgency of micturation 05/03/2015  . FOM (frequency of micturition) 05/03/2015  . Pedal edema 12/04/2014  . Dysesthesia 12/04/2014  . Nuclear sclerotic cataract 09/20/2014  . Lumbosacral radiculopathy 08/16/2014  . Spondylolisthesis at L5-S1 level 08/07/2014  . Neck pain 08/07/2014  . Chronic  rheumatic arthritis (HCC) 08/07/2014  . Neuralgia neuritis, sciatic nerve 08/03/2014  . Aseptic bony necrosis (HCC) 08/03/2014  . Aseptic necrosis of bone (HCC) 08/03/2014    Past Surgical History:  Procedure Laterality Date  . CORONARY ANGIOPLASTY WITH STENT PLACEMENT      OB History    No data available       Home Medications    Prior to Admission medications   Medication Sig Start Date End Date Taking? Authorizing Provider  amLODipine-valsartan Carlene Coria) 5-320 MG tablet Take by mouth. 01/08/16 01/07/17  Historical Provider, MD  aspirin 81 MG tablet Take 81 mg by mouth at bedtime.     Historical Provider, MD  BESIVANCE 0.6 % SUSP  07/24/14   Historical Provider, MD  busPIRone (BUSPAR) 15 MG tablet  08/01/14   Historical Provider, MD  cetirizine (ZYRTEC) 10 MG tablet Take 10 mg by mouth at bedtime.    Historical Provider, MD  ezetimibe (ZETIA) 10 MG tablet Take 10 mg by mouth at bedtime.     Historical Provider, MD  FLUZONE HIGH-DOSE 0.5 ML SUSY  01/23/15   Historical Provider, MD  folic acid (FOLVITE) 1 MG tablet Take 1 mg by mouth daily.    Historical Provider, MD  fosinopril (MONOPRIL) 40 MG tablet  05/14/14   Historical Provider, MD  gabapentin (NEURONTIN) 600 MG tablet TAKE ONE TABLET BY MOUTH THREE TIMES DAILY 09/02/15   Asa Lente, MD  halobetasol (ULTRAVATE) 0.05 % cream  07/05/14   Historical Provider, MD  hydrochlorothiazide (HYDRODIURIL) 25 MG  tablet Take 1 tablet (25 mg total) by mouth daily. 07/14/15 04/02/16  Alvira Monday, MD  isosorbide mononitrate (IMDUR) 120 MG 24 hr tablet 120 mg.  07/21/14   Historical Provider, MD  Karlene Einstein 145 MCG CAPS capsule  07/04/14   Historical Provider, MD  LIVALO 2 MG TABS  07/12/14   Historical Provider, MD  NITROSTAT 0.4 MG SL tablet  07/08/14   Historical Provider, MD  predniSONE (DELTASONE) 10 MG tablet Take 1 tablet (10 mg total) by mouth daily. Take three tablets by mouth daily for 3 days followed by 2 tablets by mouth daily for 3 days  followed by 1 tablet by mouth daily for 3 days. 06/21/16   [name], MD  ticagrelor (BRILINTA) 90 MG TABS tablet Take 90 mg by mouth.    Historical Provider, MD    Family History Family History  Problem Relation Age of Onset  . Heart disease Mother   . Heart disease Father     Social History Social History  Substance Use Topics  . Smoking status: Former Games developer  . Smokeless tobacco: Never Used  . Alcohol use No     Allergies   Latex and Lamictal [lamotrigine]   Review of Systems Review of Systems  Musculoskeletal: Positive for arthritis.  All other systems reviewed and are negative.    Physical Exam Updated Vital Signs BP 141/78   Pulse 79   Temp 97.9 F (36.6 C)   Resp 16   Ht 5\' 8"  (1.727 m)   Wt 170 lb (77.1 kg)   SpO2 98%   BMI 25.85 kg/m   Physical Exam  Constitutional: She is oriented to person, place, and time. She appears well-developed and well-nourished.  HENT:  Head: Normocephalic and atraumatic.  Cardiovascular: Normal rate and regular rhythm.   Murmur heard. Systolic ejection murmur  Pulmonary/Chest: Effort normal and breath sounds normal. No respiratory distress.  Abdominal: Soft. There is no tenderness. There is no rebound and no guarding.  Musculoskeletal:  2+ radial pulses bilaterally. There is mild bogginess and swelling and tenderness to the PIP joints of the bilateral hands. No cellulitis or abscess. She is able to range all digits.  Neurological: She is alert and oriented to person, place, and time.  Decreased sensation or lightheaded touch over the fingertips throughout bilateral hands. Intact sensation on dorsal and palmar surfaces of bilateral hands. 5 out of 5 grip strength bilaterally.  Skin: Skin is warm and dry.  Psychiatric: She has a normal mood and affect. Her behavior is normal.  Nursing note and vitals reviewed.    ED Treatments / Results  Labs (all labs ordered are listed, but only abnormal results are  displayed) Labs Reviewed - No data to display  EKG  EKG Interpretation None       Radiology No results found.  Procedures Procedures (including critical care time)  Medications Ordered in ED Medications - No data to display   Initial Impression / Assessment and Plan / ED Course  I have reviewed the triage vital signs and the nursing notes.  Pertinent labs & imaging results that were available during my care of the patient were reviewed by me and considered in my medical decision making (see chart for details).     Patient with history of rheumatoid arthritis currently on infusions here with increased joint swelling and pain for the last several weeks. She is nontoxic on examination with no evidence of septic or gouty arthritis. We will treat for arthritis flare with  short course of steroids but discussed with patient the importance of contacting her rheumatologist for further treatment.  Final Clinical Impressions(s) / ED Diagnoses   Final diagnoses:  Arthralgia of both hands    New Prescriptions New Prescriptions   PREDNISONE (DELTASONE) 10 MG TABLET    Take 1 tablet (10 mg total) by mouth daily. Take three tablets by mouth daily for 3 days followed by 2 tablets by mouth daily for 3 days followed by 1 tablet by mouth daily for 3 days.     [name], MD 06/22/16 1148

## 2016-06-21 NOTE — ED Triage Notes (Signed)
Pt c/o bil hand and finger joint pain  X 1 weekHX RA

## 2016-07-20 ENCOUNTER — Other Ambulatory Visit (HOSPITAL_BASED_OUTPATIENT_CLINIC_OR_DEPARTMENT_OTHER): Payer: Self-pay | Admitting: Family Medicine

## 2016-07-20 DIAGNOSIS — Z1231 Encounter for screening mammogram for malignant neoplasm of breast: Secondary | ICD-10-CM

## 2016-08-13 ENCOUNTER — Ambulatory Visit (INDEPENDENT_AMBULATORY_CARE_PROVIDER_SITE_OTHER): Payer: Medicare Other | Admitting: Neurology

## 2016-08-13 ENCOUNTER — Encounter: Payer: Self-pay | Admitting: Neurology

## 2016-08-13 VITALS — BP 114/66 | HR 68 | Resp 18 | Ht 68.0 in | Wt 171.5 lb

## 2016-08-13 DIAGNOSIS — M5430 Sciatica, unspecified side: Secondary | ICD-10-CM | POA: Diagnosis not present

## 2016-08-13 DIAGNOSIS — R208 Other disturbances of skin sensation: Secondary | ICD-10-CM

## 2016-08-13 DIAGNOSIS — M4317 Spondylolisthesis, lumbosacral region: Secondary | ICD-10-CM

## 2016-08-13 DIAGNOSIS — M069 Rheumatoid arthritis, unspecified: Secondary | ICD-10-CM | POA: Diagnosis not present

## 2016-08-13 MED ORDER — GABAPENTIN 800 MG PO TABS: 800.0000 mg | ORAL_TABLET | Freq: Three times a day (TID) | ORAL | 11 refills | Status: DC

## 2016-08-13 MED ORDER — CYCLOBENZAPRINE HCL 5 MG PO TABS: 5.0000 mg | ORAL_TABLET | Freq: Three times a day (TID) | ORAL | 3 refills | Status: DC | PRN

## 2016-08-13 NOTE — Progress Notes (Signed)
[facility]  PATIENT: Sara Villanueva DOB: [birth year]  REFERRING DOCTOR OR PCP:  Olivia Mackie SOURCE: patient  _________________________________   HISTORICAL  CHIEF COMPLAINT:  Chief Complaint  Patient presents with  . Back Pain    Sts. nonradiating lbp some worse over the last 3-4 weeks./fim    HISTORY OF PRESENT ILLNESS:  She is a 71 yo woman with chronic lower back pain, dysesthesia and generalized joint pain.     LBP   She is reporting more axial back pain.  Pain is achy and nothing really makes it much better or worse. Having associated leg pain. There is no change in bowel or bladder.   She also has RA.  Over the years, LBP has fluctuated a lot.     X-ray of her back showed spondylolisthesis at L5-S1.    She has never had surgery in her back in the past.   She has received some benefit from ESI, TPI and medrol dose packs.    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  Her last TP injection of steroid was about 6 months ago.    Dysesthesia:    She gets numbness and tingling in her finger tips but this is better than a ouple years ago.    She is on gabapentin with benefit and tolerates it well      Generalized joint pain/RA:   She has RA.   She was on methotrexate but her hair fell out.    She gets an IV monthly and has benefit.  . Rheumatologist is Dr. Dierdre Forth. She has some neck pain but that is bothering her much less currently.  REVIEW OF SYSTEMS: Constitutional: No fevers, chills, sweats, or change in appetite Eyes: No visual changes, double vision, eye pain Ear, nose and throat: No hearing loss, ear pain, nasal congestion, sore throat Cardiovascular: No chest pain, palpitations Respiratory: No shortness of breath at rest or with exertion.   No wheezes GastrointestinaI: No nausea, vomiting, diarrhea, abdominal pain, fecal incontinence Genitourinary: No dysuria, urinary retention or frequency.  No nocturia. Musculoskeletal: Minimal  neck pain; moderate back pain and left hip pain Integumentary: No rash, pruritus, skin lesions Neurological: as above Psychiatric: No depression at this time.  No anxiety Endocrine: No palpitations, diaphoresis, change in appetite, change in weigh or increased thirst Hematologic/Lymphatic: No anemia, purpura, petechiae. Allergic/Immunologic: No itchy/runny eyes, nasal congestion, recent allergic reactions, rashes  ALLERGIES: Allergies  Allergen Reactions  . Latex   . Lamictal [Lamotrigine] Rash    HOME MEDICATIONS:  Current Outpatient Prescriptions:  .  amLODipine-valsartan (EXFORGE) 5-320 MG tablet, Take by mouth., Disp: , Rfl:  .  aspirin 81 MG tablet, Take 81 mg by mouth at bedtime. , Disp: , Rfl:  .  BESIVANCE 0.6 % SUSP, , Disp: , Rfl:  .  busPIRone (BUSPAR) 15 MG tablet, , Disp: , Rfl:  .  cetirizine (ZYRTEC) 10 MG tablet, Take 10 mg by mouth at bedtime., Disp: , Rfl:  .  ezetimibe (ZETIA) 10 MG tablet, Take 10 mg by mouth at bedtime. , Disp: , Rfl:  .  FLUZONE HIGH-DOSE 0.5 ML SUSY, , Disp: , Rfl:  .  folic acid (FOLVITE) 1 MG tablet, Take 1 mg by mouth daily., Disp: , Rfl:  .  fosinopril (MONOPRIL) 40 MG tablet, , Disp: , Rfl:  .  gabapentin (NEURONTIN) 800 MG tablet, Take 1 tablet (800 mg total) by mouth 3 (three) times daily., Disp: 90 tablet, Rfl:  11 .  halobetasol (ULTRAVATE) 0.05 % cream, , Disp: , Rfl:  .  isosorbide mononitrate (IMDUR) 120 MG 24 hr tablet, 120 mg. , Disp: , Rfl:  .  LINZESS 145 MCG CAPS capsule, , Disp: , Rfl:  .  LIVALO 2 MG TABS, , Disp: , Rfl:  .  NITROSTAT 0.4 MG SL tablet, , Disp: , Rfl:  .  predniSONE (DELTASONE) 10 MG tablet, Take 1 tablet (10 mg total) by mouth daily. Take three tablets by mouth daily for 3 days followed by 2 tablets by mouth daily for 3 days followed by 1 tablet by mouth daily for 3 days., Disp: 18 tablet, Rfl: 0 .  ticagrelor (BRILINTA) 90 MG TABS tablet, Take 90 mg by mouth., Disp: , Rfl:  .  cyclobenzaprine (FLEXERIL) 5  MG tablet, Take 1 tablet (5 mg total) by mouth 3 (three) times daily as needed for muscle spasms., Disp: 90 tablet, Rfl: 3 .  hydrochlorothiazide (HYDRODIURIL) 25 MG tablet, Take 1 tablet (25 mg total) by mouth daily., Disp: 30 tablet, Rfl: 0  PAST MEDICAL HISTORY: Past Medical History:  Diagnosis Date  . Arthritis   . Carotid arterial disease (HCC)   . High cholesterol   . Hypertension   . Rheumatoid arteritis   . Rheumatoid arthritis (HCC)     PAST SURGICAL HISTORY: Past Surgical History:  Procedure Laterality Date  . CORONARY ANGIOPLASTY WITH STENT PLACEMENT      FAMILY HISTORY: Family History  Problem Relation Age of Onset  . Heart disease Mother   . Heart disease Father     SOCIAL HISTORY:  Social History   Social History  . Marital status: Married    Spouse name: N/A  . Number of children: N/A  . Years of education: N/A   Occupational History  . Not on file.   Social History Main Topics  . Smoking status: Former Games developer  . Smokeless tobacco: Never Used  . Alcohol use No  . Drug use: No  . Sexual activity: Yes    Birth control/ protection: Post-menopausal   Other Topics Concern  . Not on file   Social History Narrative  . No narrative on file     PHYSICAL EXAM  Vitals:   08/13/16 0957  BP: 114/66  Pulse: 68  Resp: 18  Weight: 171 lb 8 oz (77.8 kg)  Height: 5\' 8"  (1.727 m)    Body mass index is 26.08 kg/m.   General: The patient is well-developed and well-nourished and in no acute distress  Skin/Musculoskeletal:    She has mild pedal edema .  She is tender over the L5-S1 paraspinal muscles. She has reduced ROM in lower back due to pain..   There are no rashes.  Neurologic Exam  Mental status: The patient is alert and oriented x 3 at the time of the examination.    Cranial nerves: Extraocular movements are full.      Motor:  Muscle bulk is normal.   Tone is normal. Strength is  5 / 5 in all 4 extremities.   Sensory: Sensory testing  is intact to pinprick, soft touch and vibration sensation in all 4 extremities.  Gait and station: Station is normal.   Gait is arthritic . Tandem gait is wide.  Reflexes: Deep tendon reflexes are symmetric and normal bilaterally.       DIAGNOSTIC DATA (LABS, IMAGING, TESTING) - I reviewed patient records, labs, notes, testing and imaging myself where available.  Lab Results  Component  Value Date   WBC 4.8 11/30/2014   HGB 10.3 (L) 11/30/2014   HCT 32.9 (L) 11/30/2014   MCV 92.9 11/30/2014   PLT 248 11/30/2014      Component Value Date/Time   NA 140 11/30/2014 2025   K 3.6 11/30/2014 2025   CL 109 11/30/2014 2025   CO2 22 11/30/2014 2025   GLUCOSE 141 (H) 11/30/2014 2025   BUN 14 11/30/2014 2025   CREATININE 0.89 11/30/2014 2025   CALCIUM 9.0 11/30/2014 2025   PROT 7.2 06/14/2014 2000   ALBUMIN 3.3 (L) 06/14/2014 2000   AST 40 (H) 06/14/2014 2000   ALT 27 06/14/2014 2000   ALKPHOS 60 06/14/2014 2000   BILITOT 0.5 06/14/2014 2000   GFRNONAA >60 11/30/2014 2025   GFRAA >60 11/30/2014 2025       ASSESSMENT AND PLAN  Rheumatoid arthritis involving multiple sites, unspecified rheumatoid factor presence (HCC)  Sciatica, unspecified laterality  Dysesthesia  Spondylolisthesis at L5-S1 level   1.   Due to AVN, we need to avoid steroids if possible and she is not a good candidate for NSAID's.   I will increase gabapentin and add prn cyclobenzaprine to take when LBP flares.   2.   Stay Active and exercises tolerated. 3.   Return to clinic in 6 months or sooner if there are new or worsening neurologic symptoms.  Richard A. Epimenio Foot, MD, PhD 08/13/2016, 10:29 AM Certified in Neurology, Clinical Neurophysiology, Sleep Medicine, Pain Medicine and Neuroimaging  [facility] [address], [address] [place], [place] [address] [phone]

## 2016-09-07 ENCOUNTER — Ambulatory Visit (HOSPITAL_BASED_OUTPATIENT_CLINIC_OR_DEPARTMENT_OTHER): Payer: Medicare Other

## 2016-09-10 ENCOUNTER — Ambulatory Visit (HOSPITAL_BASED_OUTPATIENT_CLINIC_OR_DEPARTMENT_OTHER)
Admission: RE | Admit: 2016-09-10 | Discharge: 2016-09-10 | Disposition: A | Discharge status: Home / Self Care | Payer: Medicare Other | Source: Ambulatory Visit | Attending: Family Medicine | Admitting: Family Medicine

## 2016-09-10 DIAGNOSIS — Z1231 Encounter for screening mammogram for malignant neoplasm of breast: Secondary | ICD-10-CM | POA: Insufficient documentation

## 2016-09-11 ENCOUNTER — Other Ambulatory Visit: Payer: Self-pay | Admitting: Family Medicine

## 2016-09-11 DIAGNOSIS — R928 Other abnormal and inconclusive findings on diagnostic imaging of breast: Secondary | ICD-10-CM

## 2016-09-15 ENCOUNTER — Ambulatory Visit
Admission: RE | Admit: 2016-09-15 | Discharge: 2016-09-15 | Disposition: A | Discharge status: Home / Self Care | Payer: Medicare Other | Source: Ambulatory Visit | Attending: Family Medicine | Admitting: Family Medicine

## 2016-09-15 DIAGNOSIS — R928 Other abnormal and inconclusive findings on diagnostic imaging of breast: Secondary | ICD-10-CM

## 2016-09-18 ENCOUNTER — Other Ambulatory Visit: Payer: Medicare Other

## 2017-01-11 ENCOUNTER — Telehealth: Payer: Self-pay | Admitting: Neurology

## 2017-01-11 NOTE — Telephone Encounter (Signed)
I have spoken with Mrs. Ricciardelli this morning.  She has been seeing ortho for back pain and had ? esi on 6/20, which she sts. didn't help.  As RAS is ooo this week, I have encouraged her to call ortho back to discuss other tx. options.  She wanted an appt. with RAS in case she needs to see him again, so I have made that appt. f or her.  If ortho is not able to treat pain this week and she needs to be seen sooner, she'll let me know.  I have added her to RAS cancellation list at my desk and will call to check in with her and offer sooner appt. if one becomes available/fim

## 2017-01-11 NOTE — Telephone Encounter (Signed)
Pt called the office she is in excruciating pain in the mid back, pain in left feel and left foot is numb. She said the surgeon she has been seeing advised her 2 options: surgery or epidural. Said she had an pain injection in her back 6/20 but it did not help. Pt is wanting an epidural ordered by Dr Epimenio Foot. Please call to discuss.

## 2017-01-22 NOTE — Telephone Encounter (Signed)
Patient called office in reference to wanting to schedule her epidural.  She reached out to [facility] ortho in [place] office and they called her back Tuesday afternoon asking if she had a neurologist she stated US.  They asked if she would have liked to go see a neurologist at there office she advised no.  Patient would like to see if we are able to get this scheduled for her.

## 2017-01-22 NOTE — Telephone Encounter (Signed)
I have spoken with Mrs. Nobis this morning.  She would like to see RAS for back pain. She has recently seen both ortho and [facility] for same, has had a procedure--she thinks an esi--without relief. She understands it would be easier to see [facility] for back pain, as procedures can be done directly in that office, but sts. she would prefer to continue seeing RAS, as she had for many years. Appt. given for 01/25/17 at 1pm/fim

## 2017-01-25 ENCOUNTER — Encounter: Payer: Self-pay | Admitting: Neurology

## 2017-01-25 ENCOUNTER — Ambulatory Visit (INDEPENDENT_AMBULATORY_CARE_PROVIDER_SITE_OTHER): Payer: Medicare Other | Admitting: Neurology

## 2017-01-25 VITALS — BP 154/89 | HR 83 | Resp 18 | Ht 68.0 in | Wt 167.0 lb

## 2017-01-25 DIAGNOSIS — M069 Rheumatoid arthritis, unspecified: Secondary | ICD-10-CM

## 2017-01-25 DIAGNOSIS — M4317 Spondylolisthesis, lumbosacral region: Secondary | ICD-10-CM

## 2017-01-25 DIAGNOSIS — M5417 Radiculopathy, lumbosacral region: Secondary | ICD-10-CM | POA: Diagnosis not present

## 2017-01-25 DIAGNOSIS — R208 Other disturbances of skin sensation: Secondary | ICD-10-CM | POA: Diagnosis not present

## 2017-01-25 MED ORDER — GABAPENTIN 800 MG PO TABS: 800.0000 mg | ORAL_TABLET | Freq: Three times a day (TID) | ORAL | 11 refills | Status: DC

## 2017-01-25 NOTE — Progress Notes (Addendum)
[facility]  PATIENT: Sara Villanueva DOB: [birth year]  REFERRING DOCTOR OR PCP:  Olivia Mackie SOURCE: patient  _________________________________   HISTORICAL  CHIEF COMPLAINT:  Chief Complaint  Patient presents with  . Back Pain    Here with continued c/o lbp radiating down left leg.  Left foot numb. Sts. she has been dx. with RA.  Rheumatolist is Dr. Dierdre Forth.  Has seen [facility] Ortho, Dr. Shon Baton and sts. had MRI.  Sts. was offered esi vs surgery. ? ESI by Dr. Shon Baton without relief on 11/18/16/fim    HISTORY OF PRESENT ILLNESS:  She is a 71 yo woman with chronic lower back pain, dysesthesia and generalized joint pain.     Low back pain: She is reporting more pain going down the left leg. Previously her pain was predominantly in the lower back but now it is worse in the leg than the back. She denies any weakness but does note some sensory changes in the foot. There is no problems with bowel or bladder function.   She sees Dr. Dierdre Forth of Rheum (has RA) and was referred to Dr. Shon Baton.   She had one ESI with him but she feels she did not get any benefit.   She has significant spondylolisthesis at L5-S1 and by history and exam appears to have an L5 radiculopathy on the left. I had done several epidural steroids on her years ago but we do not have the equipment at the current office.   Trigger point injections have helped her some in the past but we are trying to limit these as she has avascular necrosis. Current low back pain is milder but the left leg pain is more severe. It is intermittent and will increase with certain positions. Gabapentin initially seemed to help but not as much recently.  Dysesthesia:   She has a dysesthetic sensation in the left foot and in her fingers.   REVIEW OF SYSTEMS: Constitutional: No fevers, chills, sweats, or change in appetite Eyes: No visual changes, double vision, eye pain Ear, nose and throat: No hearing loss, ear pain, nasal  congestion, sore throat Cardiovascular: No chest pain, palpitations Respiratory: No shortness of breath at rest or with exertion.   No wheezes GastrointestinaI: No nausea, vomiting, diarrhea, abdominal pain, fecal incontinence Genitourinary: No dysuria, urinary retention or frequency.  No nocturia. Musculoskeletal: Minimal neck pain; moderate back pain and left hip pain Integumentary: No rash, pruritus, skin lesions Neurological: as above Psychiatric: No depression at this time.  No anxiety Endocrine: No palpitations, diaphoresis, change in appetite, change in weigh or increased thirst Hematologic/Lymphatic: No anemia, purpura, petechiae. Allergic/Immunologic: No itchy/runny eyes, nasal congestion, recent allergic reactions, rashes  ALLERGIES: Allergies  Allergen Reactions  . Latex   . Lamictal [Lamotrigine] Rash    HOME MEDICATIONS:  Current Outpatient Prescriptions:  .  aspirin 81 MG tablet, Take 81 mg by mouth at bedtime. , Disp: , Rfl:  .  BESIVANCE 0.6 % SUSP, , Disp: , Rfl:  .  busPIRone (BUSPAR) 15 MG tablet, , Disp: , Rfl:  .  ezetimibe (ZETIA) 10 MG tablet, Take 10 mg by mouth at bedtime. , Disp: , Rfl:  .  gabapentin (NEURONTIN) 800 MG tablet, Take 1 tablet (800 mg total) by mouth 3 (three) times daily., Disp: 90 tablet, Rfl: 11 .  isosorbide mononitrate (IMDUR) 120 MG 24 hr tablet, 120 mg. , Disp: , Rfl:  .  metoprolol tartrate (LOPRESSOR) 25 MG tablet, Take 25 mg by mouth., Disp: , Rfl:  .  amLODipine-valsartan (EXFORGE) 5-320 MG tablet, Take by mouth., Disp: , Rfl:  .  hydrochlorothiazide (HYDRODIURIL) 25 MG tablet, Take 1 tablet (25 mg total) by mouth daily., Disp: 30 tablet, Rfl: 0 .  NITROSTAT 0.4 MG SL tablet, , Disp: , Rfl:   PAST MEDICAL HISTORY: Past Medical History:  Diagnosis Date  . Arthritis   . Carotid arterial disease (HCC)   . High cholesterol   . Hypertension   . Rheumatoid arteritis   . Rheumatoid arthritis (HCC)     PAST SURGICAL  HISTORY: Past Surgical History:  Procedure Laterality Date  . CORONARY ANGIOPLASTY WITH STENT PLACEMENT      FAMILY HISTORY: Family History  Problem Relation Age of Onset  . Heart disease Mother   . Heart disease Father     SOCIAL HISTORY:  Social History   Social History  . Marital status: Married    Spouse name: N/A  . Number of children: N/A  . Years of education: N/A   Occupational History  . Not on file.   Social History Main Topics  . Smoking status: Former Games developer  . Smokeless tobacco: Never Used  . Alcohol use No  . Drug use: No  . Sexual activity: Yes    Birth control/ protection: Post-menopausal   Other Topics Concern  . Not on file   Social History Narrative  . No narrative on file     PHYSICAL EXAM  Vitals:   01/25/17 1312  BP: (!) 154/89  Pulse: 83  Resp: 18  Weight: 167 lb (75.8 kg)  Height: 5\' 8"  (1.727 m)    Body mass index is 25.39 kg/m.   General: The patient is well-developed and well-nourished and in no acute distress  Skin/Musculoskeletal:    She has mild pedal edema .  She is tender over the L5-S1 paraspinal muscles. She has reduced ROM in lower back due to pain..   There are no rashes.  Neurologic Exam  Mental status: The patient is alert and oriented x 3 at the time of the examination.    Cranial nerves: Extraocular movements are full.      Motor:  Muscle bulk is normal.   Tone is normal. Strength is  5 / 5 in all 4 extremities.   Sensory: She has intact sensation to touch and vibration in the hands. In the legs, she has symmetric vibration sensation. However, she has reduced sensation to touch and temperature with some allodynia in the left L5 distribution..  Gait and station: Station is normal.   The gait is arthritic. The tandem gait is wide. There is no Romberg sign.   Reflexes: Deep tendon reflexes are symmetric and normal bilaterally.       DIAGNOSTIC DATA (LABS, IMAGING, TESTING) - I reviewed patient records,  labs, notes, testing and imaging myself where available.  Lab Results  Component Value Date   WBC 4.8 11/30/2014   HGB 10.3 (L) 11/30/2014   HCT 32.9 (L) 11/30/2014   MCV 92.9 11/30/2014   PLT 248 11/30/2014      Component Value Date/Time   NA 140 11/30/2014 2025   K 3.6 11/30/2014 2025   CL 109 11/30/2014 2025   CO2 22 11/30/2014 2025   GLUCOSE 141 (H) 11/30/2014 2025   BUN 14 11/30/2014 2025   CREATININE 0.89 11/30/2014 2025   CALCIUM 9.0 11/30/2014 2025   PROT 7.2 06/14/2014 2000   ALBUMIN 3.3 (L) 06/14/2014 2000   AST 40 (H) 06/14/2014 2000   ALT 27  06/14/2014 2000   ALKPHOS 60 06/14/2014 2000   BILITOT 0.5 06/14/2014 2000   GFRNONAA >60 11/30/2014 2025   GFRAA >60 11/30/2014 2025       ASSESSMENT AND PLAN  Spondylolisthesis at L5-S1 level - Plan: DG INJECT DIAG/THERA/INC NEEDLE/CATH/PLC EPI/LUMB/SAC W/IMG  Lumbosacral radiculopathy - Plan: DG INJECT DIAG/THERA/INC NEEDLE/CATH/PLC EPI/LUMB/SAC W/IMG  Rheumatoid arthritis involving multiple sites, unspecified rheumatoid factor presence (HCC)  Dysesthesia   1.   Refer for epidural steroid injection. If this does not help and pain continues, she will need to consider a surgical procedure at L5-S1. Continue gabapentin..   2.   Stay Active and exercises tolerated. 3.   Return to clinic in 6 months or sooner if there are new or worsening neurologic symptoms.  Richard A. Epimenio Foot, MD, PhD 01/25/2017, 2:21 PM Certified in Neurology, Clinical Neurophysiology, Sleep Medicine, Pain Medicine and Neuroimaging  [facility] [address] [place], [place] [address] [phone]

## 2017-02-10 ENCOUNTER — Ambulatory Visit: Payer: Self-pay | Admitting: Neurology

## 2017-02-10 ENCOUNTER — Ambulatory Visit
Admission: RE | Admit: 2017-02-10 | Discharge: 2017-02-10 | Disposition: A | Discharge status: Home / Self Care | Payer: Medicare Other | Source: Ambulatory Visit | Attending: Neurology | Admitting: Neurology

## 2017-02-10 DIAGNOSIS — M5417 Radiculopathy, lumbosacral region: Secondary | ICD-10-CM

## 2017-02-10 DIAGNOSIS — M4317 Spondylolisthesis, lumbosacral region: Secondary | ICD-10-CM

## 2017-02-10 MED ORDER — METHYLPREDNISOLONE ACETATE 40 MG/ML INJ SUSP (RADIOLOG
120.0000 mg | Freq: Once | INTRAMUSCULAR | Status: AC
  Administered 2017-02-10: 120 mg via EPIDURAL

## 2017-02-10 MED ORDER — IOPAMIDOL (ISOVUE-M 200) INJECTION 41%
1.0000 mL | Freq: Once | INTRAMUSCULAR | Status: AC
  Administered 2017-02-10: 1 mL via EPIDURAL

## 2017-02-10 NOTE — Discharge Instructions (Signed)

## 2017-02-15 ENCOUNTER — Ambulatory Visit: Payer: Medicare Other | Admitting: Neurology

## 2017-02-23 ENCOUNTER — Telehealth: Payer: Self-pay | Admitting: Neurology

## 2017-02-23 DIAGNOSIS — M5417 Radiculopathy, lumbosacral region: Secondary | ICD-10-CM

## 2017-02-23 DIAGNOSIS — M4317 Spondylolisthesis, lumbosacral region: Secondary | ICD-10-CM

## 2017-02-23 NOTE — Telephone Encounter (Signed)
Pt called in she is not doing any better since the LP. She said Dr Epimenio Foot said he would refer her to a surgeon if she wasn't any better. She would like a referral to a surgeon in [place] that they had talked about previously. Please call

## 2017-02-23 NOTE — Telephone Encounter (Signed)
LMOM that referral  has been ordered to Dr. Petra Kuba in [place]

## 2017-02-24 NOTE — Telephone Encounter (Signed)
Dr. Petra Kuba is moving to [facility] she will be back seeing patient's mid October Telephone [phone] - fax [phone] . Referral has been sent. Also called and left patient a message relaying to patient process of what was going on so patient would no details.

## 2017-02-24 NOTE — Telephone Encounter (Signed)
Noted/fim 

## 2017-03-02 DIAGNOSIS — J0191 Acute recurrent sinusitis, unspecified: Secondary | ICD-10-CM | POA: Insufficient documentation

## 2017-03-08 NOTE — Telephone Encounter (Signed)
Called and spoke to [facility] Dr. Ollen Gross  And I had to resend the referral to telephone [phone] . Patient is aware and I have spoke to her she is aware of details.

## 2017-03-08 NOTE — Telephone Encounter (Signed)
Pt called in she has tried calling to schedule the appt but has not been successful. Please call

## 2017-03-13 ENCOUNTER — Encounter (HOSPITAL_BASED_OUTPATIENT_CLINIC_OR_DEPARTMENT_OTHER): Payer: Self-pay | Admitting: Emergency Medicine

## 2017-03-13 ENCOUNTER — Emergency Department (HOSPITAL_BASED_OUTPATIENT_CLINIC_OR_DEPARTMENT_OTHER)
Admission: EM | Admit: 2017-03-13 | Discharge: 2017-03-14 | Disposition: A | Discharge status: Home / Self Care | Payer: Medicare Other | Attending: Emergency Medicine | Admitting: Emergency Medicine

## 2017-03-13 DIAGNOSIS — Z7982 Long term (current) use of aspirin: Secondary | ICD-10-CM | POA: Insufficient documentation

## 2017-03-13 DIAGNOSIS — Z79899 Other long term (current) drug therapy: Secondary | ICD-10-CM | POA: Diagnosis not present

## 2017-03-13 DIAGNOSIS — R55 Syncope and collapse: Secondary | ICD-10-CM | POA: Diagnosis not present

## 2017-03-13 DIAGNOSIS — Z91040 Latex allergy status: Secondary | ICD-10-CM | POA: Insufficient documentation

## 2017-03-13 DIAGNOSIS — Z87891 Personal history of nicotine dependence: Secondary | ICD-10-CM | POA: Diagnosis not present

## 2017-03-13 DIAGNOSIS — I10 Essential (primary) hypertension: Secondary | ICD-10-CM | POA: Diagnosis not present

## 2017-03-13 DIAGNOSIS — R0989 Other specified symptoms and signs involving the circulatory and respiratory systems: Secondary | ICD-10-CM | POA: Diagnosis not present

## 2017-03-13 NOTE — ED Triage Notes (Signed)
PT presents with c/o low blood pressure. PT was at [facility] ED and left because they wanted to admit her to [facility] and she did not want to go to [facility].

## 2017-03-14 DIAGNOSIS — R55 Syncope and collapse: Secondary | ICD-10-CM | POA: Diagnosis not present

## 2017-03-14 LAB — TROPONIN I
Troponin I: <0.03 ng/mL (ref <0.03)
Troponin I: <0.03 ng/mL (ref <0.03)

## 2017-03-14 LAB — BASIC METABOLIC PANEL
Anion gap: 4 — ABNORMAL LOW (ref 5–15)
BUN: 16 mg/dL (ref 6–20)
CALCIUM: 9 mg/dL (ref 8.9–10.3)
CO2: 24 mmol/L (ref 22–32)
Chloride: 112 mmol/L — ABNORMAL HIGH (ref 101–111)
Creatinine, Ser: 0.82 mg/dL (ref 0.44–1.00)
GFR calc Af Amer: >60 mL/min (ref >60)
Glucose, Bld: 110 mg/dL — ABNORMAL HIGH (ref 65–99)
POTASSIUM: 4.1 mmol/L (ref 3.5–5.1)
SODIUM: 140 mmol/L (ref 135–145)

## 2017-03-14 LAB — CBC WITH DIFFERENTIAL/PLATELET
BASOS ABS: 0 10*3/uL (ref 0.0–0.1)
Basophils Relative: 0 %
EOS PCT: 4 %
Eosinophils Absolute: 0.2 10*3/uL (ref 0.0–0.7)
HCT: 36.3 % (ref 36.0–46.0)
Hemoglobin: 11.5 g/dL — ABNORMAL LOW (ref 12.0–15.0)
Lymphocytes Relative: 39 %
Lymphs Abs: 2.3 10*3/uL (ref 0.7–4.0)
MCH: 28.5 pg (ref 26.0–34.0)
MCHC: 31.7 g/dL (ref 30.0–36.0)
MCV: 90.1 fL (ref 78.0–100.0)
Monocytes Absolute: 0.6 10*3/uL (ref 0.1–1.0)
Monocytes Relative: 10 %
Neutro Abs: 2.8 10*3/uL (ref 1.7–7.7)
Neutrophils Relative %: 47 %
PLATELETS: 200 10*3/uL (ref 150–400)
RBC: 4.03 MIL/uL (ref 3.87–5.11)
RDW: 15.1 % (ref 11.5–15.5)
WBC: 6 10*3/uL (ref 4.0–10.5)

## 2017-03-14 NOTE — ED Notes (Signed)
IV attempt x 1 without success.

## 2017-03-14 NOTE — ED Notes (Signed)
Standing at 3 minutes for orthostatic vs: BP- 194/93  HR- 94

## 2017-03-14 NOTE — ED Notes (Signed)
Pt lying flat for 10 minutes before orthostatics can be done. 

## 2017-03-14 NOTE — ED Provider Notes (Signed)
[facility] Provider Note   CSN: [id number] Arrival date & time: 03/13/17  2240     History   Chief Complaint Chief Complaint  Patient presents with  . Hypotension    HPI Sara Villanueva is a 71 y.o. female.  HPI  This is a 71 year old female who presents with generalized weakness and near syncope. Patient reports this afternoon that her blood pressure dropped. She states that she has been dealing with intermittent drops in blood pressure causing her to feel dizzy and weak. She is managed by her primary cardiologist in [facility] and has had multiple adjustments in her blood pressure medication. She felt weak and dizzy this afternoon and her family called EMS. She was notably hypotensive. She was seen in an outside hospital. She had lab work drawn and reports "my enzymes were high." Patient declined transfer to [facility]. Instead, she left AGAINST MEDICAL ADVICE and came [facility] as [facility]. She denies any chest pain or shortness of breath during these episodes. They have happened multiple times over the last several months. Currently she is without complaint and feels at her baseline. She is notably hypertensive in triage.  Past Medical History:  Diagnosis Date  . Arthritis   . Carotid arterial disease (HCC)   . High cholesterol   . Hypertension   . Rheumatoid arteritis   . Rheumatoid arthritis [facility])     Patient Active Problem List   Diagnosis Date Noted  . Knee pain, left 08/05/2015  . Allergic rhinitis 07/15/2015  . Carotid artery narrowing 07/15/2015  . Peripheral vascular disease (HCC) 07/15/2015  . Renal artery stenosis (HCC) 07/15/2015  . 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 Benign essential HTN 06/13/2015  . HLD (hyperlipidemia) 06/13/2015  . Rheumatoid arthritis (HCC) 06/13/2015  . Urgency of micturation 05/03/2015  . FOM (frequency of micturition) 05/03/2015  . Pedal edema 12/04/2014  . Dysesthesia  12/04/2014  . Nuclear sclerotic cataract 09/20/2014  . Lumbosacral radiculopathy 08/16/2014  . Spondylolisthesis at L5-S1 level 08/07/2014  . Neck pain 08/07/2014  . Chronic rheumatic arthritis (HCC) 08/07/2014  . Neuralgia neuritis, sciatic nerve 08/03/2014  . Aseptic bony necrosis (HCC) 08/03/2014  . Aseptic necrosis of bone (HCC) 08/03/2014    Past Surgical History:  Procedure Laterality Date  . CORONARY ANGIOPLASTY WITH STENT PLACEMENT      OB History    No data available       Home Medications    Prior to Admission medications   Medication Sig Start Date End Date Taking? Authorizing Provider  amLODipine-valsartan Carlene Coria) 5-320 MG tablet Take by mouth. 01/08/16 01/07/17  [provider]  aspirin 81 MG tablet Take 81 mg by mouth at bedtime.     [provider]  BESIVANCE 0.6 % SUSP  07/24/14   [provider]  busPIRone (BUSPAR) 15 MG tablet  08/01/14   [provider]  ezetimibe (ZETIA) 10 MG tablet Take 10 mg by mouth at bedtime.     [provider]  gabapentin (NEURONTIN) 800 MG tablet Take 1 tablet (800 mg total) by mouth 3 (three) times daily. 01/25/17   Sater, Pearletha Furl, MD  hydrochlorothiazide (HYDRODIURIL) 25 MG tablet Take 1 tablet (25 mg total) by mouth daily. 07/14/15 04/02/16  Alvira Monday, MD  isosorbide mononitrate (IMDUR) 120 MG 24 hr tablet 120 mg.  07/21/14   [provider]  NITROSTAT 0.4 MG SL tablet  07/08/14   [provider]  Family History Family History  Problem Relation Age of Onset  . Heart disease Mother   . Heart disease Father     Social History Social History  Substance Use Topics  . Smoking status: Former Games developer  . Smokeless tobacco: Never Used  . Alcohol use No     Allergies   Latex and Lamictal [lamotrigine]   Review of Systems Review of Systems  Constitutional: Negative for fever.  Respiratory: Negative for shortness of breath.   Cardiovascular: Negative for  chest pain.  Gastrointestinal: Negative for abdominal pain, nausea and vomiting.  Neurological: Positive for dizziness. Negative for weakness and numbness.  All other systems reviewed and are negative.    Physical Exam Updated Vital Signs BP (!) 175/84   Pulse 62   Temp 97.9 F (36.6 C) (Oral)   Resp 18   SpO2 99%   Physical Exam  Constitutional: She is oriented to person, place, and time. No distress.  Elderly, no acute distress  HENT:  Head: Normocephalic and atraumatic.  Cardiovascular: Normal rate and regular rhythm.   Murmur heard. Pulmonary/Chest: Effort normal and breath sounds normal. No respiratory distress. She has no wheezes.  Abdominal: Soft. Bowel sounds are normal. There is no tenderness.  Musculoskeletal: She exhibits no edema.  Neurological: She is alert and oriented to person, place, and time.  Cranial nerves II through XII intact, 5 out of 5 strength in all 4 extremities, normal gait  Skin: Skin is warm and dry.  Psychiatric: She has a normal mood and affect.  Nursing note and vitals reviewed.    ED Treatments / Results  Labs (all labs ordered are listed, but only abnormal results are displayed) Labs Reviewed  CBC WITH DIFFERENTIAL/PLATELET - Abnormal; Notable for the following:       Result Value   Hemoglobin 11.5 (*)    All other components within normal limits  BASIC METABOLIC PANEL - Abnormal; Notable for the following:    Chloride 112 (*)    Glucose, Bld 110 (*)    Anion gap 4 (*)    All other components within normal limits  TROPONIN I  TROPONIN I    EKG  EKG Interpretation  Date/Time:  Saturday March 13 2017 22:50:44 EDT Ventricular Rate:  77 PR Interval:  164 QRS Duration: 86 QT Interval:  384 QTC Calculation: 434 R Axis:   55 Text Interpretation:  Normal sinus rhythm Possible Left atrial enlargement Nonspecific ST and T wave abnormality Abnormal ECG No significant change since last tracing Confirmed by Ross Marcus ([id number])  on 03/14/2017 3:28:31 AM       Radiology No results found.  Procedures Procedures (including critical care time)  Medications Ordered in ED Medications - No data to display   Initial Impression / Assessment and Plan / ED Course  I have reviewed the triage vital signs and the nursing notes.  Pertinent labs & imaging results that were available during my care of the patient were reviewed by me and considered in my medical decision making (see chart for details).     Patient presents after leaving AMA from an outside ED. She presented there at approximately 6 PM with a near syncopal episode. She had a slightly elevated troponin T based on chart review. Recommended observation admission for cycling of enzymes. Patient reports recent cardiac stress test in July which was negative. She is currently asymptomatic and states she has multiple episodes similar to this over the last several months related to drastic blood pressure changes.  She is nontoxic and vital signs are notable for hypertension. She's nonfocal. EKG shows no signs of ischemia.  Lab work including troponin I is negative. I had a long discussion with the patient regarding disposition. At this time she has been intermittently observed for over 8 hours including recent observation in the emergency room in [place]. Would recommend at minimum 1 additional repeat troponin. Patient was offered observation admission for repeat troponins. She prefers for repeat troponin test redrawn here. Feel this is reasonable given she is otherwise asymptomatic. It is unclear the clinical significance of the troponin T elevation at the outside hospital. Repeat troponin at 4 hours is negative. Patient remained asymptomatic. Will discharge home with very close follow-up with her cardiologist.  After history, exam, and medical workup I feel the patient has been appropriately medically screened and is safe for discharge home. Pertinent diagnoses were  discussed with the patient. Patient was given return precautions.   Final Clinical Impressions(s) / ED Diagnoses   Final diagnoses:  Near syncope  Labile blood pressure    New Prescriptions New Prescriptions   No medications on file     Shon Baton, MD 03/14/17 [id number]

## 2017-03-14 NOTE — Discharge Instructions (Signed)
You were seen today for generalized weakness and near syncope. Your workup here is reassuring. Your heart tests are negative. Follow-up closely with your cardiologist for recheck and medication adjustment.

## 2017-03-22 DIAGNOSIS — M48061 Spinal stenosis, lumbar region without neurogenic claudication: Secondary | ICD-10-CM | POA: Insufficient documentation

## 2017-06-15 ENCOUNTER — Encounter: Payer: Self-pay | Admitting: Neurology

## 2017-06-15 ENCOUNTER — Ambulatory Visit (INDEPENDENT_AMBULATORY_CARE_PROVIDER_SITE_OTHER): Payer: Medicare Other | Admitting: Neurology

## 2017-06-15 ENCOUNTER — Ambulatory Visit: Payer: Medicare Other | Admitting: Neurology

## 2017-06-15 ENCOUNTER — Other Ambulatory Visit: Payer: Self-pay

## 2017-06-15 VITALS — BP 91/55 | HR 67 | Resp 16 | Ht 68.0 in | Wt 160.5 lb

## 2017-06-15 DIAGNOSIS — M542 Cervicalgia: Secondary | ICD-10-CM | POA: Diagnosis not present

## 2017-06-15 DIAGNOSIS — M5430 Sciatica, unspecified side: Secondary | ICD-10-CM | POA: Diagnosis not present

## 2017-06-15 DIAGNOSIS — M4317 Spondylolisthesis, lumbosacral region: Secondary | ICD-10-CM | POA: Diagnosis not present

## 2017-06-15 DIAGNOSIS — M069 Rheumatoid arthritis, unspecified: Secondary | ICD-10-CM

## 2017-06-15 DIAGNOSIS — M5417 Radiculopathy, lumbosacral region: Secondary | ICD-10-CM

## 2017-06-15 MED ORDER — MELOXICAM 7.5 MG PO TABS: 7.5000 mg | ORAL_TABLET | Freq: Every day | ORAL | 5 refills | Status: DC

## 2017-06-15 NOTE — Progress Notes (Signed)
[facility]  PATIENT: Sara Villanueva DOB: 1945-06-17  REFERRING DOCTOR OR PCP:  Olivia Mackie SOURCE: patient  _________________________________   HISTORICAL  CHIEF COMPLAINT:  Chief Complaint  Patient presents with  . Back Pain    Sts. last esi, done at [facility], did not help back pain, even for a short time.  Sts. she saw Dr. Petra Kuba and was told no surgery needed, that pain is likely from RA.  Today c/o new pain left knee, lower leg, onset about 3 mos. ago. She brought a CD of her last MRI at [facility] today/fim  . Leg Pain    HISTORY OF PRESENT ILLNESS:  She is a 72 yo woman with chronic lower back pain, dysesthesia and generalized joint pain.     Update 06/15/2017:  She has pain that is mostly in the back but also near the inside left knee and posterior ankle.    She feels gabapentin used to help her but not much now.   Last year, she had an epidural steroid injection for probable left L5 radiculopathy but did not receive any benefit. She sore neurosurgery (Dr. Petra Kuba), but surgery was not felt to be a good option for her. Also of note, she has rheumatoid arthritis.  I personally reviewed an MRI from [facility] performed last month. It shows spondylolisthesis of L5 upon S1 associated with facet hypertrophy and disc bulging of the uncovered disc. There is moderately severe left foraminal narrowing with possible left L5 nerve root compression. Additionally there is moderate bilateral lateral recess stenosis. No nerve root impingement at other levels.  She also reports some neck pain but that has been pretty stable and does not bother her much at this time.  From 01/25/2017:  Low back pain: She is reporting more pain going down the left leg. Previously her pain was predominantly in the lower back but now it is worse in the leg than the back. She denies any weakness but does note some sensory changes in the foot. There is no  problems with bowel or bladder function.   She sees Dr. Dierdre Forth of Rheum (has RA) and was referred to Dr. Shon Baton.   She had one ESI with him but she feels she did not get any benefit.   She has significant spondylolisthesis at L5-S1 and by history and exam appears to have an L5 radiculopathy on the left. I had done several epidural steroids on her years ago but we do not have the equipment at the current office.   Trigger point injections have helped her some in the past but we are trying to limit these as she has avascular necrosis. Current low back pain is milder but the left leg pain is more severe. It is intermittent and will increase with certain positions. Gabapentin initially seemed to help but not as much recently.  Dysesthesia:   She has a dysesthetic sensation in the left foot and in her fingers.   REVIEW OF SYSTEMS: Constitutional: No fevers, chills, sweats, or change in appetite Eyes: No visual changes, double vision, eye pain Ear, nose and throat: No hearing loss, ear pain, nasal congestion, sore throat Cardiovascular: No chest pain, palpitations Respiratory: No shortness of breath at rest or with exertion.   No wheezes GastrointestinaI: No nausea, vomiting, diarrhea, abdominal pain, fecal incontinence Genitourinary: No dysuria, urinary retention or frequency.  No nocturia. Musculoskeletal: Mild neck pain; moderate back pain and left hip pain Integumentary: No rash, pruritus, skin lesions Neurological: as above  Psychiatric: No depression at this time.  No anxiety Endocrine: No palpitations, diaphoresis, change in appetite, change in weigh or increased thirst Hematologic/Lymphatic: No anemia, purpura, petechiae. Allergic/Immunologic: No itchy/runny eyes, nasal congestion, recent allergic reactions, rashes  ALLERGIES: Allergies  Allergen Reactions  . Latex   . Lamictal [Lamotrigine] Rash    HOME MEDICATIONS:  Current Outpatient Medications:  .  aspirin 81 MG tablet, Take  81 mg by mouth at bedtime. , Disp: , Rfl:  .  BESIVANCE 0.6 % SUSP, , Disp: , Rfl:  .  busPIRone (BUSPAR) 15 MG tablet, , Disp: , Rfl:  .  ezetimibe (ZETIA) 10 MG tablet, Take 10 mg by mouth at bedtime. , Disp: , Rfl:  .  gabapentin (NEURONTIN) 800 MG tablet, Take 1 tablet (800 mg total) by mouth 3 (three) times daily., Disp: 90 tablet, Rfl: 11 .  isosorbide mononitrate (IMDUR) 120 MG 24 hr tablet, 120 mg. , Disp: , Rfl:  .  NITROSTAT 0.4 MG SL tablet, , Disp: , Rfl:  .  amLODipine-valsartan (EXFORGE) 5-320 MG tablet, Take by mouth., Disp: , Rfl:  .  hydrochlorothiazide (HYDRODIURIL) 25 MG tablet, Take 1 tablet (25 mg total) by mouth daily., Disp: 30 tablet, Rfl: 0  PAST MEDICAL HISTORY: Past Medical History:  Diagnosis Date  . Arthritis   . Carotid arterial disease (HCC)   . High cholesterol   . Hypertension   . Rheumatoid arteritis   . Rheumatoid arthritis (HCC)     PAST SURGICAL HISTORY: Past Surgical History:  Procedure Laterality Date  . CORONARY ANGIOPLASTY WITH STENT PLACEMENT      FAMILY HISTORY: Family History  Problem Relation Age of Onset  . Heart disease Mother   . Heart disease Father     SOCIAL HISTORY:  Social History   Socioeconomic History  . Marital status: Married    Spouse name: Not on file  . Number of children: Not on file  . Years of education: Not on file  . Highest education level: Not on file  Social Needs  . Financial resource strain: Not on file  . Food insecurity - worry: Not on file  . Food insecurity - inability: Not on file  . Transportation needs - medical: Not on file  . Transportation needs - non-medical: Not on file  Occupational History  . Not on file  Tobacco Use  . Smoking status: Former Games developer  . Smokeless tobacco: Never Used  Substance and Sexual Activity  . Alcohol use: No    Alcohol/week: 0.0 oz  . Drug use: No  . Sexual activity: Yes    Birth control/protection: Post-menopausal  Other Topics Concern  . Not on  file  Social History Narrative  . Not on file     PHYSICAL EXAM  Vitals:   06/15/17 1142  BP: (!) 91/55  Pulse: 67  Resp: 16  Weight: 160 lb 8 oz (72.8 kg)  Height: 5\' 8"  (1.727 m)    Body mass index is 24.4 kg/m.   General: The patient is well-developed and well-nourished and in no acute distress  Skin/Musculoskeletal:    There is mild pedal edema. She is tender over the left piriformis muscle. There is no significant tenderness over the lumbar paraspinal muscles or the trochanteric bursae.   Neurologic Exam  Mental status: The patient is alert and oriented x 3 at the time of the examination.    Cranial nerves: Extraocular movements are full.      Motor:  Muscle bulk is  normal.   Tone is normal. Strength is 5/5 in the arms and legs except for 4+/5 EHL strength on the left..   Sensory: She has intact sensation to touch and vibration in the hands. In the legs, she has symmetric vibration sensation. She has slight anemia in the left L5 distribution.   Gait and station: Station is normal.   The gait is arthritic. The tandem gait is wide. There is no Romberg sign.   Reflexes: Deep tendon reflexes are symmetric and normal bilaterally.       DIAGNOSTIC DATA (LABS, IMAGING, TESTING) - I reviewed patient records, labs, notes, testing and imaging myself where available.  Lab Results  Component Value Date   WBC 6.0 03/14/2017   HGB 11.5 (L) 03/14/2017   HCT 36.3 03/14/2017   MCV 90.1 03/14/2017   PLT 200 03/14/2017      Component Value Date/Time   NA 140 03/14/2017 0107   K 4.1 03/14/2017 0107   CL 112 (H) 03/14/2017 0107   CO2 24 03/14/2017 0107   GLUCOSE 110 (H) 03/14/2017 0107   BUN 16 03/14/2017 0107   CREATININE 0.82 03/14/2017 0107   CALCIUM 9.0 03/14/2017 0107   PROT 7.2 06/14/2014 2000   ALBUMIN 3.3 (L) 06/14/2014 2000   AST 40 (H) 06/14/2014 2000   ALT 27 06/14/2014 2000   ALKPHOS 60 06/14/2014 2000   BILITOT 0.5 06/14/2014 2000   GFRNONAA >60  03/14/2017 0107   GFRAA >60 03/14/2017 0107       ASSESSMENT AND PLAN  Sciatica, unspecified laterality  Lumbosacral radiculopathy  Neck pain  Rheumatoid arthritis, involving unspecified site, unspecified rheumatoid factor presence (HCC)  Spondylolisthesis at L5-S1 level   1.   Refer for epidural steroid injection (prefers [facility]).  2.   Add meloxicam Continue gabapentin..   3.   Stay Active and exercises tolerated. 4.   Trigger point injection of 80 mg Depo-Medrol in 3 mL Marcaine into the left piriformis muscle using sterile technique. He tolerated the procedure well and there were no complications. 5.   Return to clinic in 6 months or sooner if there are new or worsening neurologic symptoms.  Ayde Record A. Epimenio Foot, MD, PhD 06/15/2017, 12:16 PM Certified in Neurology, Clinical Neurophysiology, Sleep Medicine, Pain Medicine and Neuroimaging  [facility] [address] [place], [place] [address] [phone]

## 2017-08-05 ENCOUNTER — Other Ambulatory Visit (HOSPITAL_BASED_OUTPATIENT_CLINIC_OR_DEPARTMENT_OTHER): Payer: Self-pay | Admitting: Physician Assistant

## 2017-08-05 DIAGNOSIS — Z1231 Encounter for screening mammogram for malignant neoplasm of breast: Secondary | ICD-10-CM

## 2017-08-12 ENCOUNTER — Ambulatory Visit: Payer: Medicare Other | Admitting: Neurology

## 2017-09-13 ENCOUNTER — Ambulatory Visit (HOSPITAL_BASED_OUTPATIENT_CLINIC_OR_DEPARTMENT_OTHER)
Admission: RE | Admit: 2017-09-13 | Discharge: 2017-09-13 | Disposition: A | Discharge status: Home / Self Care | Payer: Medicare Other | Source: Ambulatory Visit | Attending: Physician Assistant | Admitting: Physician Assistant

## 2017-09-13 DIAGNOSIS — Z1231 Encounter for screening mammogram for malignant neoplasm of breast: Secondary | ICD-10-CM | POA: Insufficient documentation

## 2017-09-13 DIAGNOSIS — R928 Other abnormal and inconclusive findings on diagnostic imaging of breast: Secondary | ICD-10-CM | POA: Diagnosis not present

## 2017-09-14 ENCOUNTER — Other Ambulatory Visit: Payer: Self-pay | Admitting: Physician Assistant

## 2017-09-14 DIAGNOSIS — R928 Other abnormal and inconclusive findings on diagnostic imaging of breast: Secondary | ICD-10-CM

## 2017-09-20 ENCOUNTER — Ambulatory Visit
Admission: RE | Admit: 2017-09-20 | Discharge: 2017-09-20 | Disposition: A | Discharge status: Home / Self Care | Payer: Medicare Other | Source: Ambulatory Visit | Attending: Physician Assistant | Admitting: Physician Assistant

## 2017-09-20 DIAGNOSIS — R928 Other abnormal and inconclusive findings on diagnostic imaging of breast: Secondary | ICD-10-CM

## 2017-09-26 ENCOUNTER — Emergency Department (HOSPITAL_BASED_OUTPATIENT_CLINIC_OR_DEPARTMENT_OTHER)
Admission: EM | Admit: 2017-09-26 | Discharge: 2017-09-26 | Disposition: A | Discharge status: Home / Self Care | Payer: Medicare Other | Attending: Emergency Medicine | Admitting: Emergency Medicine

## 2017-09-26 ENCOUNTER — Encounter (HOSPITAL_BASED_OUTPATIENT_CLINIC_OR_DEPARTMENT_OTHER): Payer: Self-pay

## 2017-09-26 ENCOUNTER — Other Ambulatory Visit: Payer: Self-pay

## 2017-09-26 DIAGNOSIS — Z955 Presence of coronary angioplasty implant and graft: Secondary | ICD-10-CM | POA: Diagnosis not present

## 2017-09-26 DIAGNOSIS — R55 Syncope and collapse: Secondary | ICD-10-CM | POA: Diagnosis present

## 2017-09-26 DIAGNOSIS — Z91040 Latex allergy status: Secondary | ICD-10-CM | POA: Insufficient documentation

## 2017-09-26 DIAGNOSIS — Z87891 Personal history of nicotine dependence: Secondary | ICD-10-CM | POA: Insufficient documentation

## 2017-09-26 DIAGNOSIS — R079 Chest pain, unspecified: Secondary | ICD-10-CM | POA: Diagnosis not present

## 2017-09-26 DIAGNOSIS — Z79899 Other long term (current) drug therapy: Secondary | ICD-10-CM | POA: Insufficient documentation

## 2017-09-26 DIAGNOSIS — T675XXA Heat exhaustion, unspecified, initial encounter: Secondary | ICD-10-CM | POA: Diagnosis not present

## 2017-09-26 DIAGNOSIS — I10 Essential (primary) hypertension: Secondary | ICD-10-CM | POA: Diagnosis not present

## 2017-09-26 LAB — BASIC METABOLIC PANEL
Anion gap: 8 (ref 5–15)
BUN: 17 mg/dL (ref 6–20)
CHLORIDE: 109 mmol/L (ref 101–111)
CO2: 21 mmol/L — ABNORMAL LOW (ref 22–32)
CREATININE: 0.86 mg/dL (ref 0.44–1.00)
Calcium: 8.8 mg/dL — ABNORMAL LOW (ref 8.9–10.3)
GFR calc Af Amer: >60 mL/min (ref >60)
GLUCOSE: 88 mg/dL (ref 65–99)
POTASSIUM: 4.3 mmol/L (ref 3.5–5.1)
SODIUM: 138 mmol/L (ref 135–145)

## 2017-09-26 LAB — CBC
HEMATOCRIT: 33.7 % — AB (ref 36.0–46.0)
Hemoglobin: 11.3 g/dL — ABNORMAL LOW (ref 12.0–15.0)
MCH: 31 pg (ref 26.0–34.0)
MCHC: 33.5 g/dL (ref 30.0–36.0)
MCV: 92.6 fL (ref 78.0–100.0)
PLATELETS: 172 10*3/uL (ref 150–400)
RBC: 3.64 MIL/uL — ABNORMAL LOW (ref 3.87–5.11)
RDW: 14.9 % (ref 11.5–15.5)
WBC: 7 10*3/uL (ref 4.0–10.5)

## 2017-09-26 LAB — TROPONIN I: Troponin I: <0.03 ng/mL (ref <0.03)

## 2017-09-26 NOTE — ED Notes (Signed)
Pt ambulatory to d/c window with steady gait. D/c home with family
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
## 2017-09-26 NOTE — ED Notes (Signed)
ED Provider at bedside. 

## 2017-09-26 NOTE — Discharge Instructions (Addendum)
Make sure that you rest in a cool environment for the rest of today.  Return if your condition worsens or if concern for any reason or see your doctor
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
## 2017-09-30 ENCOUNTER — Telehealth: Payer: Self-pay | Admitting: Neurology

## 2017-09-30 NOTE — Telephone Encounter (Signed)
Spoke with Sara Villanueva this am. She c/o sudden onset of pain right foot with walking this am.  Did not have pain when she went to bed last night.  "When I walk and bend my foot, it hurts in my joints."  Denies fever, denies impaired skin integrity, denies redness.  She sees a rheumatologist for RA, just had infusion last wk.  She will f/u with her rheumatologist./fim

## 2017-09-30 NOTE — Telephone Encounter (Signed)
Pt called states she unable to walk on her left this morning. The foot is a little swollen on the lateral side. No redness. Pt states she had no symptoms when she went to bed. Please call to advise.

## 2017-10-18 DIAGNOSIS — K5909 Other constipation: Secondary | ICD-10-CM | POA: Insufficient documentation

## 2017-10-18 DIAGNOSIS — Z86010 Personal history of colonic polyps: Secondary | ICD-10-CM | POA: Insufficient documentation

## 2017-10-21 ENCOUNTER — Emergency Department (HOSPITAL_BASED_OUTPATIENT_CLINIC_OR_DEPARTMENT_OTHER): Payer: Medicare Other

## 2017-10-21 ENCOUNTER — Encounter (HOSPITAL_BASED_OUTPATIENT_CLINIC_OR_DEPARTMENT_OTHER): Payer: Self-pay | Admitting: Emergency Medicine

## 2017-10-21 ENCOUNTER — Other Ambulatory Visit: Payer: Self-pay

## 2017-10-21 ENCOUNTER — Emergency Department (HOSPITAL_BASED_OUTPATIENT_CLINIC_OR_DEPARTMENT_OTHER)
Admission: EM | Admit: 2017-10-21 | Discharge: 2017-10-21 | Disposition: A | Discharge status: Home / Self Care | Payer: Medicare Other | Attending: Emergency Medicine | Admitting: Emergency Medicine

## 2017-10-21 DIAGNOSIS — Z955 Presence of coronary angioplasty implant and graft: Secondary | ICD-10-CM | POA: Insufficient documentation

## 2017-10-21 DIAGNOSIS — Z87891 Personal history of nicotine dependence: Secondary | ICD-10-CM | POA: Diagnosis not present

## 2017-10-21 DIAGNOSIS — Z91040 Latex allergy status: Secondary | ICD-10-CM | POA: Diagnosis not present

## 2017-10-21 DIAGNOSIS — R002 Palpitations: Secondary | ICD-10-CM | POA: Diagnosis not present

## 2017-10-21 DIAGNOSIS — R072 Precordial pain: Secondary | ICD-10-CM | POA: Diagnosis not present

## 2017-10-21 DIAGNOSIS — Z7982 Long term (current) use of aspirin: Secondary | ICD-10-CM | POA: Diagnosis not present

## 2017-10-21 DIAGNOSIS — Z79899 Other long term (current) drug therapy: Secondary | ICD-10-CM | POA: Diagnosis not present

## 2017-10-21 DIAGNOSIS — I10 Essential (primary) hypertension: Secondary | ICD-10-CM | POA: Diagnosis not present

## 2017-10-21 LAB — URINALYSIS, ROUTINE W REFLEX MICROSCOPIC
Bilirubin Urine: NEGATIVE
Glucose, UA: NEGATIVE mg/dL
Hgb urine dipstick: NEGATIVE
KETONES UR: NEGATIVE mg/dL
NITRITE: NEGATIVE
PROTEIN: NEGATIVE mg/dL
Specific Gravity, Urine: <1.005 — ABNORMAL LOW (ref 1.005–1.030)
pH: 6 (ref 5.0–8.0)

## 2017-10-21 LAB — COMPREHENSIVE METABOLIC PANEL
ALT: 22 U/L (ref 14–54)
ANION GAP: 10 (ref 5–15)
AST: 36 U/L (ref 15–41)
Albumin: 3.3 g/dL — ABNORMAL LOW (ref 3.5–5.0)
Alkaline Phosphatase: 69 U/L (ref 38–126)
BILIRUBIN TOTAL: 0.5 mg/dL (ref 0.3–1.2)
BUN: 11 mg/dL (ref 6–20)
CO2: 23 mmol/L (ref 22–32)
Calcium: 9 mg/dL (ref 8.9–10.3)
Chloride: 106 mmol/L (ref 101–111)
Creatinine, Ser: 0.75 mg/dL (ref 0.44–1.00)
GLUCOSE: 84 mg/dL (ref 65–99)
Potassium: 4.4 mmol/L (ref 3.5–5.1)
Sodium: 139 mmol/L (ref 135–145)
TOTAL PROTEIN: 6.8 g/dL (ref 6.5–8.1)

## 2017-10-21 LAB — URINALYSIS, MICROSCOPIC (REFLEX)

## 2017-10-21 LAB — CBC WITH DIFFERENTIAL/PLATELET
Basophils Absolute: 0 10*3/uL (ref 0.0–0.1)
Basophils Relative: 0 %
EOS ABS: 0.3 10*3/uL (ref 0.0–0.7)
Eosinophils Relative: 4 %
HCT: 34.9 % — ABNORMAL LOW (ref 36.0–46.0)
HEMOGLOBIN: 11.8 g/dL — AB (ref 12.0–15.0)
Lymphocytes Relative: 34 %
Lymphs Abs: 2 10*3/uL (ref 0.7–4.0)
MCH: 31.6 pg (ref 26.0–34.0)
MCHC: 33.8 g/dL (ref 30.0–36.0)
MCV: 93.6 fL (ref 78.0–100.0)
Monocytes Absolute: 0.6 10*3/uL (ref 0.1–1.0)
Monocytes Relative: 11 %
NEUTROS ABS: 3 10*3/uL (ref 1.7–7.7)
NEUTROS PCT: 51 %
Platelets: 225 10*3/uL (ref 150–400)
RBC: 3.73 MIL/uL — AB (ref 3.87–5.11)
RDW: 13.4 % (ref 11.5–15.5)
WBC: 5.9 10*3/uL (ref 4.0–10.5)

## 2017-10-21 LAB — PROTIME-INR
INR: 1.04
PROTHROMBIN TIME: 13.5 s (ref 11.4–15.2)

## 2017-10-21 LAB — BRAIN NATRIURETIC PEPTIDE: B Natriuretic Peptide: 85.9 pg/mL (ref 0.0–100.0)

## 2017-10-21 LAB — TROPONIN I

## 2017-10-21 MED ORDER — ASPIRIN 81 MG PO CHEW
324.0000 mg | CHEWABLE_TABLET | Freq: Once | ORAL | Status: AC
  Administered 2017-10-21: 324 mg via ORAL
  Filled 2017-10-21: qty 4

## 2017-10-21 NOTE — ED Notes (Signed)
Pt cont await md eval. 

## 2017-10-21 NOTE — Discharge Instructions (Signed)
You should be called by your cardiologist's office either today or tomorrow morning.  They will be giving you an appointment time to get a monitor for your heart that you will wear continuously. If you develop chest pain, racing heart, lightheadedness or other concerning symptoms return to the emergency department.  Your cardiologist requests that if you do need to be seen in the emergency department, you go to [facility] so they make consult and evaluate you there.

## 2017-10-21 NOTE — ED Triage Notes (Addendum)
Palpations at times since Friday after seeing her MD  , especially on exertion. States had it this am while on treadmill, denies at present and no CP. Saw cards on Friday

## 2017-10-21 NOTE — ED Provider Notes (Signed)
[facility] EMERGENCY DEPARTMENT Provider Note   CSN: [id number] Arrival date & time: 10/21/17  7322     History   Chief Complaint Chief Complaint  Patient presents with  . Palpitations    HPI Sara Villanueva is a 72 y.o. female.  HPI Patient reports she did see her cardiologist on Friday.  She was not having symptoms at that time.  She reports that yesterday she went walking with her husband.  She reports after walking for a while she started to develop some chest discomfort.  She did not have associated shortness of breath nausea diaphoresis or syncope.  She reports they did cut the walk  Short.  She reports that evening, she had sensation of her heart racing.  She reports she did not feel like she was going to pass out at the time.  She was not having chest pain with it.  She reports that it did resolve spontaneously.  Symptoms had resolved until she got on the treadmill this morning.  She reports that she had been on the treadmill for a short while when she again felt that her heart was racing.  She had any chest pain at that time.  She did get off the treadmill and the symptoms resolved after a few minutes.  She has not had any chest pain today.  She has not had any syncope or near syncope.  Patient reports she does take a daily baby aspirin.  Ports she used to be on blood pressure medications but they had to be cut back because her blood pressures were getting too low. Past Medical History:  Diagnosis Date  . Arthritis   . Carotid arterial disease (HCC)   . High cholesterol   . Hypertension   . Rheumatoid arteritis   . Rheumatoid arthritis [facility])     Patient Active Problem List   Diagnosis Date Noted  . Knee pain, left 08/05/2015  . Allergic rhinitis 07/15/2015  . Carotid artery narrowing 07/15/2015  . Peripheral vascular disease (HCC) 07/15/2015  . Renal artery stenosis (HCC) 07/15/2015  . 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 Benign essential HTN 06/13/2015    . HLD (hyperlipidemia) 06/13/2015  . Rheumatoid arthritis (HCC) 06/13/2015  . Urgency of micturation 05/03/2015  . FOM (frequency of micturition) 05/03/2015  . Pedal edema 12/04/2014  . Dysesthesia 12/04/2014  . Nuclear sclerotic cataract 09/20/2014  . Lumbosacral radiculopathy 08/16/2014  . Spondylolisthesis at L5-S1 level 08/07/2014  . Neck pain 08/07/2014  . Chronic rheumatic arthritis (HCC) 08/07/2014  . Neuralgia neuritis, sciatic nerve 08/03/2014  . Aseptic bony necrosis (HCC) 08/03/2014  . Aseptic necrosis of bone (HCC) 08/03/2014    Past Surgical History:  Procedure Laterality Date  . CORONARY ANGIOPLASTY WITH STENT PLACEMENT       OB History   None      Home Medications    Prior to Admission medications   Medication Sig Start Date End Date Taking? Authorizing Provider  amLODipine-valsartan Carlene Coria) 5-320 MG tablet Take by mouth. 01/08/16 01/07/17  [provider]  aspirin 81 MG tablet Take 81 mg by mouth at bedtime.     [provider]  busPIRone (BUSPAR) 15 MG tablet  08/01/14   [provider]  ezetimibe (ZETIA) 10 MG tablet Take 10 mg by mouth at bedtime.     [provider]  gabapentin (NEURONTIN) 800 MG tablet Take 1 tablet (800 mg total) by mouth 3 (three) times daily. 01/25/17   Sater, Pearletha Furl, MD  hydrochlorothiazide (HYDRODIURIL) 25 MG tablet Take 1 tablet (25 mg total) by mouth daily. 07/14/15 04/02/16  Alvira Monday, MD  meloxicam (MOBIC) 7.5 MG tablet Take 1 tablet (7.5 mg total) by mouth daily. 06/15/17   Sater, Pearletha Furl, MD  NITROSTAT 0.4 MG SL tablet  07/08/14   [provider]    Family History Family History  Problem Relation Age of Onset  . Heart disease Mother   . Heart disease Father     Social History Social History   Tobacco Use  . Smoking status: Former Games developer  . Smokeless tobacco: Never Used  Substance Use Topics  . Alcohol use: No    Alcohol/week: 0.0 oz  . Drug use: No      Allergies   Nickel; Latex; and Lamictal [lamotrigine]   Review of Systems Review of Systems 10 Systems reviewed and are negative for acute change except as noted in the HPI.   Physical Exam Updated Vital Signs BP (!) 149/81   Pulse 63   Temp 98.4 F (36.9 C) (Oral)   Resp 14   Ht 5\' 8"  (1.727 m)   Wt 74.4 kg (164 lb)   SpO2 100%   BMI 24.94 kg/m   Physical Exam  Constitutional: She is oriented to person, place, and time. She appears well-developed and well-nourished.  HENT:  Head: Normocephalic and atraumatic.  Eyes: Pupils are equal, round, and reactive to light. EOM are normal.  Neck: Neck supple.  Cardiovascular: Normal rate, regular rhythm, normal heart sounds and intact distal pulses.  Pulmonary/Chest: Effort normal and breath sounds normal.  Abdominal: Soft. Bowel sounds are normal. She exhibits no distension. There is no tenderness.  Musculoskeletal: Normal range of motion. She exhibits no edema.  Neurological: She is alert and oriented to person, place, and time. She has normal strength. No cranial nerve deficit. She exhibits normal muscle tone. Coordination normal. GCS eye subscore is 4. GCS verbal subscore is 5. GCS motor subscore is 6.  Skin: Skin is warm, dry and intact.  Psychiatric: She has a normal mood and affect.     ED Treatments / Results  Labs (all labs ordered are listed, but only abnormal results are displayed) Labs Reviewed  COMPREHENSIVE METABOLIC PANEL - Abnormal; Notable for the following components:      Result Value   Albumin 3.3 (*)    All other components within normal limits  CBC WITH DIFFERENTIAL/PLATELET - Abnormal; Notable for the following components:   RBC 3.73 (*)    Hemoglobin 11.8 (*)    HCT 34.9 (*)    All other components within normal limits  URINALYSIS, ROUTINE W REFLEX MICROSCOPIC - Abnormal; Notable for the following components:   Specific Gravity, Urine <1.005 (*)    Leukocytes, UA SMALL (*)    All other  components within normal limits  URINALYSIS, MICROSCOPIC (REFLEX) - Abnormal; Notable for the following components:   Bacteria, UA RARE (*)    All other components within normal limits  TROPONIN I  BRAIN NATRIURETIC PEPTIDE  PROTIME-INR    EKG EKG Interpretation  Date/Time:  Thursday Oct 21 2017 10:19:41 EDT Ventricular Rate:  93 PR Interval:  154 QRS Duration: 84 QT Interval:  360 QTC Calculation: 447 R Axis:   68 Text Interpretation:  Normal sinus rhythm Biatrial enlargement Left ventricular hypertrophy with repolarization abnormality Abnormal ECG no change from previous Confirmed by Arby Barrette [phone]) on 10/21/2017 4:24:43 PM   Radiology Dg Chest 2 View  Result Date: 10/21/2017 CLINICAL DATA:  Chest pain and tachycardia EXAM: CHEST - 2 VIEW COMPARISON:  December 24, 2015. FINDINGS: There is no edema or consolidation. The heart size and pulmonary vascularity are normal. No adenopathy. There is aortic atherosclerosis. There are stents in the left circumflex and right coronary arteries. No evident bone lesions. No pneumothorax. IMPRESSION: No edema or consolidation. Heart size normal. Coronary stents evident. There is aortic atherosclerosis. Aortic Atherosclerosis (ICD10-I70.0). Electronically Signed   By: Bretta Bang III M.D.   On: 10/21/2017 13:16    Procedures Procedures (including critical care time)  Medications Ordered in ED Medications  aspirin chewable tablet 324 mg (324 mg Oral Given 10/21/17 1336)     Initial Impression / Assessment and Plan / ED Course  I have reviewed the triage vital signs and the nursing notes.  Pertinent labs & imaging results that were available during my care of the patient were reviewed by me and considered in my medical decision making (see chart for details).     Consult: Reviewed with Dr. Heron Nay.  Patient's cardiologist.  He advises patient will be contacted either this evening or tomorrow morning to schedule Holter monitor  placement.  At this time, feels patient is appropriate for outpatient follow-up.  Recommends patient is advised to seek care at [facility] emergency department if she has recurrence of chest pain so any interventional or further decision-making regarding diagnostic study can be made by consultation in the emergency department by her primary cardiologist group.  Final Clinical Impressions(s) / ED Diagnoses   Final diagnoses:  Palpitations  Precordial pain   Patient presents as outlined above.  She does have known history of coronary artery disease.  She has 2 stents.  Evaluation at this time does not reveal acute ischemic changes.  EKG is unchanged from previous.  No elevation in troponin.  Patient identifies 2 episodes of palpitations without syncope or near syncope.  She does not have known episode of atrial fibrillation.  Dr. Heron Nay plans to place holter monitor for surveillance.  Return precautions reviewed. ED Discharge Orders    None       Arby Barrette, MD 10/21/17 [phone]

## 2017-12-14 ENCOUNTER — Other Ambulatory Visit: Payer: Self-pay

## 2017-12-14 ENCOUNTER — Encounter: Payer: Self-pay | Admitting: Neurology

## 2017-12-14 ENCOUNTER — Ambulatory Visit (INDEPENDENT_AMBULATORY_CARE_PROVIDER_SITE_OTHER): Payer: Medicare Other | Admitting: Neurology

## 2017-12-14 VITALS — BP 150/83 | HR 89 | Resp 16 | Ht 68.0 in | Wt 161.5 lb

## 2017-12-14 DIAGNOSIS — R600 Localized edema: Secondary | ICD-10-CM | POA: Diagnosis not present

## 2017-12-14 DIAGNOSIS — M5430 Sciatica, unspecified side: Secondary | ICD-10-CM

## 2017-12-14 DIAGNOSIS — M5417 Radiculopathy, lumbosacral region: Secondary | ICD-10-CM | POA: Diagnosis not present

## 2017-12-14 DIAGNOSIS — M069 Rheumatoid arthritis, unspecified: Secondary | ICD-10-CM

## 2017-12-14 MED ORDER — MELOXICAM 7.5 MG PO TABS: 7.5000 mg | ORAL_TABLET | Freq: Every day | ORAL | 11 refills | Status: DC

## 2017-12-14 MED ORDER — GABAPENTIN 800 MG PO TABS: 800.0000 mg | ORAL_TABLET | Freq: Three times a day (TID) | ORAL | 11 refills | Status: DC

## 2017-12-14 NOTE — Progress Notes (Signed)
[facility]  PATIENT: Sara Villanueva DOB: 12/19/1945  REFERRING DOCTOR OR PCP:  Olivia Mackie SOURCE: patient  _________________________________   HISTORICAL  CHIEF COMPLAINT:  Chief Complaint  Patient presents with  . Back Pain    Sts. back pain is improved since starting Remicade for RA/fim  . Rheumatoid Arthritis    HISTORY OF PRESENT ILLNESS:  She is a 72 yo woman with chronic lower back pain, dysesthesia and generalized joint pain.     Update 12/14/2017: She feels her back is much better since she was diagnosed with RA and was started on Remicade.    She sees Dr. Dierdre Forth of Rheumatology.   It has helped her more than the ESI did.    He **  She has had a carotid doppler recently and has 80% right ICA and has 60% left ICA stenosis.   She is scheduled to have a right CEA.     She is noting more swelling in her left ankle.    She notes some pain there in the afternoons.     She is on meloxicam and gabapentin for her pain.       Update 06/15/2017:  She has pain that is mostly in the back but also near the inside left knee and posterior ankle.    She feels gabapentin used to help her but not much now.   Last year, she had an epidural steroid injection for probable left L5 radiculopathy but did not receive any benefit. She sore neurosurgery (Dr. Petra Kuba), but surgery was not felt to be a good option for her. Also of note, she has rheumatoid arthritis.  I personally reviewed an MRI from [facility] performed last month. It shows spondylolisthesis of L5 upon S1 associated with facet hypertrophy and disc bulging of the uncovered disc. There is moderately severe left foraminal narrowing with possible left L5 nerve root compression. Additionally there is moderate bilateral lateral recess stenosis. No nerve root impingement at other levels.  She also reports some neck pain but that has been pretty stable and does not bother her much at this time.  From  01/25/2017:  Low back pain: She is reporting more pain going down the left leg. Previously her pain was predominantly in the lower back but now it is worse in the leg than the back. She denies any weakness but does note some sensory changes in the foot. There is no problems with bowel or bladder function.   She sees Dr. Dierdre Forth of Rheum (has RA) and was referred to Dr. Shon Baton.   She had one ESI with him but she feels she did not get any benefit.   She has significant spondylolisthesis at L5-S1 and by history and exam appears to have an L5 radiculopathy on the left. I had done several epidural steroids on her years ago but we do not have the equipment at the current office.   Trigger point injections have helped her some in the past but we are trying to limit these as she has avascular necrosis. Current low back pain is milder but the left leg pain is more severe. It is intermittent and will increase with certain positions. Gabapentin initially seemed to help but not as much recently.  Dysesthesia:   She has a dysesthetic sensation in the left foot and in her fingers.   REVIEW OF SYSTEMS: Constitutional: No fevers, chills, sweats, or change in appetite Eyes: No visual changes, double vision, eye pain Ear, nose and throat: No  hearing loss, ear pain, nasal congestion, sore throat Cardiovascular: No chest pain, palpitations Respiratory: No shortness of breath at rest or with exertion.   No wheezes GastrointestinaI: No nausea, vomiting, diarrhea, abdominal pain, fecal incontinence Genitourinary: No dysuria, urinary retention or frequency.  No nocturia. Musculoskeletal: Mild neck pain; moderate back pain and left hip pain Integumentary: No rash, pruritus, skin lesions Neurological: as above Psychiatric: No depression at this time.  No anxiety Endocrine: No palpitations, diaphoresis, change in appetite, change in weigh or increased thirst Hematologic/Lymphatic: No anemia, purpura,  petechiae. Allergic/Immunologic: No itchy/runny eyes, nasal congestion, recent allergic reactions, rashes  ALLERGIES: Allergies  Allergen Reactions  . Nickel Rash  . Latex   . Lamictal [Lamotrigine] Rash    HOME MEDICATIONS:  Current Outpatient Medications:  .  aspirin 81 MG tablet, Take 81 mg by mouth at bedtime. , Disp: , Rfl:  .  busPIRone (BUSPAR) 15 MG tablet, , Disp: , Rfl:  .  ezetimibe (ZETIA) 10 MG tablet, Take 10 mg by mouth at bedtime. , Disp: , Rfl:  .  gabapentin (NEURONTIN) 800 MG tablet, Take 1 tablet (800 mg total) by mouth 3 (three) times daily., Disp: 90 tablet, Rfl: 11 .  meloxicam (MOBIC) 7.5 MG tablet, Take 1 tablet (7.5 mg total) by mouth daily., Disp: 30 tablet, Rfl: 11 .  NITROSTAT 0.4 MG SL tablet, , Disp: , Rfl:  .  amLODipine-valsartan (EXFORGE) 5-320 MG tablet, Take by mouth., Disp: , Rfl:  .  hydrochlorothiazide (HYDRODIURIL) 25 MG tablet, Take 1 tablet (25 mg total) by mouth daily., Disp: 30 tablet, Rfl: 0  PAST MEDICAL HISTORY: Past Medical History:  Diagnosis Date  . Arthritis   . Carotid arterial disease (HCC)   . High cholesterol   . Hypertension   . Rheumatoid arteritis   . Rheumatoid arthritis (HCC)     PAST SURGICAL HISTORY: Past Surgical History:  Procedure Laterality Date  . CORONARY ANGIOPLASTY WITH STENT PLACEMENT      FAMILY HISTORY: Family History  Problem Relation Age of Onset  . Heart disease Mother   . Heart disease Father     SOCIAL HISTORY:  Social History   Socioeconomic History  . Marital status: Married    Spouse name: Not on file  . Number of children: Not on file  . Years of education: Not on file  . Highest education level: Not on file  Occupational History  . Not on file  Social Needs  . Financial resource strain: Not on file  . Food insecurity:    Worry: Not on file    Inability: Not on file  . Transportation needs:    Medical: Not on file    Non-medical: Not on file  Tobacco Use  . Smoking  status: Former Games developer  . Smokeless tobacco: Never Used  Substance and Sexual Activity  . Alcohol use: No    Alcohol/week: 0.0 oz  . Drug use: No  . Sexual activity: Yes    Birth control/protection: Post-menopausal  Lifestyle  . Physical activity:    Days per week: Not on file    Minutes per session: Not on file  . Stress: Not on file  Relationships  . Social connections:    Talks on phone: Not on file    Gets together: Not on file    Attends religious service: Not on file    Active member of club or organization: Not on file    Attends meetings of clubs or organizations: Not on file  Relationship status: Not on file  . Intimate partner violence:    Fear of current or ex partner: Not on file    Emotionally abused: Not on file    Physically abused: Not on file    Forced sexual activity: Not on file  Other Topics Concern  . Not on file  Social History Narrative  . Not on file     PHYSICAL EXAM  Vitals:   12/14/17 1444  BP: (!) 150/83  Pulse: 89  Resp: 16  Weight: 161 lb 8 oz (73.3 kg)  Height: 5\' 8"  (1.727 m)    Body mass index is 24.56 kg/m.   General: The patient is well-developed and well-nourished and in no acute distress.    Skin/Musculoskeletal:    She has mild pedal edema..  Today, she does not have much tenderness in the lumbar region or over the piriformis muscles..   Neurologic Exam  Mental status: The patient is alert and oriented x 3 at the time of the examination.    Cranial nerves: Extraocular movements are full.      Motor:  Muscle bulk is normal.   Tone is normal. Strength is 5/5 in the arms and legs except for 4+/5 EHL strength on the left..   Sensory: She has intact sensation to touch and vibration in the hands. In the legs, she has symmetric vibration sensation.  There is reduced sensation in the L5 distribution on the left.  Gait and station: Station is normal.   The gait is arthritic. The tandem gait is wide. There is no Romberg sign.    Reflexes: Deep tendon reflexes are symmetric and normal bilaterally.       DIAGNOSTIC DATA (LABS, IMAGING, TESTING) - I reviewed patient records, labs, notes, testing and imaging myself where available.  Lab Results  Component Value Date   WBC 5.9 10/21/2017   HGB 11.8 (L) 10/21/2017   HCT 34.9 (L) 10/21/2017   MCV 93.6 10/21/2017   PLT 225 10/21/2017      Component Value Date/Time   NA 139 10/21/2017 1315   K 4.4 10/21/2017 1315   CL 106 10/21/2017 1315   CO2 23 10/21/2017 1315   GLUCOSE 84 10/21/2017 1315   BUN 11 10/21/2017 1315   CREATININE 0.75 10/21/2017 1315   CALCIUM 9.0 10/21/2017 1315   PROT 6.8 10/21/2017 1315   ALBUMIN 3.3 (L) 10/21/2017 1315   AST 36 10/21/2017 1315   ALT 22 10/21/2017 1315   ALKPHOS 69 10/21/2017 1315   BILITOT 0.5 10/21/2017 1315   GFRNONAA >60 10/21/2017 1315   GFRAA >60 10/21/2017 1315       ASSESSMENT AND PLAN  Rheumatoid arthritis involving multiple sites, unspecified rheumatoid factor presence (HCC)  Lumbosacral radiculopathy  Sciatica, unspecified laterality  Pedal edema   1.   Continue meloxicam Continue gabapentin..   2.   She will continue to see Dr. Dierdre Forth of rheumatology for her or a.  Remicade is helping her. 3.   Stay Active and exercises tolerated. 4.    Wear compression stockings for mild pedal edema. Return to clinic in 12 months or sooner if there are new or worsening neurologic symptoms.  Teaira Croft A. Epimenio Foot, MD, PhD 12/14/2017, 5:23 PM Certified in Neurology, Clinical Neurophysiology, Sleep Medicine, Pain Medicine and Neuroimaging  [facility] [address] [place], [place] [address] [phone]

## 2017-12-27 DIAGNOSIS — Z9889 Other specified postprocedural states: Secondary | ICD-10-CM | POA: Insufficient documentation

## 2018-01-17 ENCOUNTER — Emergency Department (HOSPITAL_BASED_OUTPATIENT_CLINIC_OR_DEPARTMENT_OTHER)
Admission: EM | Admit: 2018-01-17 | Discharge: 2018-01-17 | Disposition: A | Discharge status: Home / Self Care | Payer: Medicare Other | Attending: Emergency Medicine | Admitting: Emergency Medicine

## 2018-01-17 ENCOUNTER — Encounter (HOSPITAL_BASED_OUTPATIENT_CLINIC_OR_DEPARTMENT_OTHER): Payer: Self-pay

## 2018-01-17 ENCOUNTER — Other Ambulatory Visit: Payer: Self-pay

## 2018-01-17 DIAGNOSIS — Z79899 Other long term (current) drug therapy: Secondary | ICD-10-CM | POA: Diagnosis not present

## 2018-01-17 DIAGNOSIS — E7800 Pure hypercholesterolemia, unspecified: Secondary | ICD-10-CM | POA: Insufficient documentation

## 2018-01-17 DIAGNOSIS — Z91040 Latex allergy status: Secondary | ICD-10-CM | POA: Diagnosis not present

## 2018-01-17 DIAGNOSIS — E785 Hyperlipidemia, unspecified: Secondary | ICD-10-CM | POA: Diagnosis not present

## 2018-01-17 DIAGNOSIS — I10 Essential (primary) hypertension: Secondary | ICD-10-CM | POA: Insufficient documentation

## 2018-01-17 DIAGNOSIS — Z7982 Long term (current) use of aspirin: Secondary | ICD-10-CM | POA: Insufficient documentation

## 2018-01-17 DIAGNOSIS — N3000 Acute cystitis without hematuria: Secondary | ICD-10-CM | POA: Insufficient documentation

## 2018-01-17 DIAGNOSIS — D649 Anemia, unspecified: Secondary | ICD-10-CM | POA: Insufficient documentation

## 2018-01-17 DIAGNOSIS — H538 Other visual disturbances: Secondary | ICD-10-CM | POA: Diagnosis not present

## 2018-01-17 DIAGNOSIS — Z87891 Personal history of nicotine dependence: Secondary | ICD-10-CM | POA: Insufficient documentation

## 2018-01-17 LAB — CBC WITH DIFFERENTIAL/PLATELET
Basophils Absolute: 0 10*3/uL (ref 0.0–0.1)
Basophils Relative: 0 %
EOS ABS: 0.1 10*3/uL (ref 0.0–0.7)
Eosinophils Relative: 2 %
HCT: 28.7 % — ABNORMAL LOW (ref 36.0–46.0)
HEMOGLOBIN: 9.5 g/dL — AB (ref 12.0–15.0)
LYMPHS ABS: 1.6 10*3/uL (ref 0.7–4.0)
LYMPHS PCT: 26 %
MCH: 28.8 pg (ref 26.0–34.0)
MCHC: 33.1 g/dL (ref 30.0–36.0)
MCV: 87 fL (ref 78.0–100.0)
MONOS PCT: 13 %
Monocytes Absolute: 0.8 10*3/uL (ref 0.1–1.0)
NEUTROS PCT: 59 %
Neutro Abs: 3.6 10*3/uL (ref 1.7–7.7)
Platelets: 294 10*3/uL (ref 150–400)
RBC: 3.3 MIL/uL — AB (ref 3.87–5.11)
RDW: 14.7 % (ref 11.5–15.5)
WBC: 6.1 10*3/uL (ref 4.0–10.5)

## 2018-01-17 LAB — TROPONIN I: Troponin I: <0.03 ng/mL (ref <0.03)

## 2018-01-17 LAB — URINALYSIS, ROUTINE W REFLEX MICROSCOPIC
BILIRUBIN URINE: NEGATIVE
Glucose, UA: NEGATIVE mg/dL
Ketones, ur: NEGATIVE mg/dL
NITRITE: POSITIVE — AB
PH: 6 (ref 5.0–8.0)
Protein, ur: NEGATIVE mg/dL
Specific Gravity, Urine: <1.005 — ABNORMAL LOW (ref 1.005–1.030)

## 2018-01-17 LAB — BASIC METABOLIC PANEL
ANION GAP: 8 (ref 5–15)
BUN: 18 mg/dL (ref 8–23)
CHLORIDE: 107 mmol/L (ref 98–111)
CO2: 26 mmol/L (ref 22–32)
Calcium: 8.9 mg/dL (ref 8.9–10.3)
Creatinine, Ser: 0.86 mg/dL (ref 0.44–1.00)
GFR calc non Af Amer: >60 mL/min (ref >60)
Glucose, Bld: 111 mg/dL — ABNORMAL HIGH (ref 70–99)
POTASSIUM: 4.3 mmol/L (ref 3.5–5.1)
SODIUM: 141 mmol/L (ref 135–145)

## 2018-01-17 LAB — URINALYSIS, MICROSCOPIC (REFLEX): WBC, UA: >50 WBC/hpf (ref 0–5)

## 2018-01-17 LAB — OCCULT BLOOD X 1 CARD TO LAB, STOOL: Fecal Occult Bld: NEGATIVE

## 2018-01-17 MED ORDER — CEPHALEXIN 500 MG PO CAPS: 500.0000 mg | ORAL_CAPSULE | Freq: Two times a day (BID) | ORAL | 0 refills | Status: DC

## 2018-01-17 MED ORDER — SODIUM CHLORIDE 0.9 % IV BOLUS
500.0000 mL | Freq: Once | INTRAVENOUS | Status: AC
  Administered 2018-01-17: 500 mL via INTRAVENOUS

## 2018-01-17 MED ORDER — SODIUM CHLORIDE 0.9 % IV SOLN
1.0000 g | Freq: Once | INTRAVENOUS | Status: AC
  Administered 2018-01-17: 1 g via INTRAVENOUS
  Filled 2018-01-17: qty 10

## 2018-01-17 NOTE — ED Notes (Signed)
Pt ambulated to restroom. 

## 2018-01-17 NOTE — ED Provider Notes (Addendum)
[facility] EMERGENCY DEPARTMENT Provider Note   CSN: [id number] Arrival date & time: 01/17/18  1111     History   Chief Complaint Chief Complaint  Patient presents with  . Blurred Vision    HPI Sara Villanueva is a 72 y.o. female.  Patient is a 72 year old female who presents with blurry vision.  She states she has had blurry vision in both eyes since about 1030 this morning.  She denies any double vision.  She states nothing makes it better or worse.  It is the same anyway she looks.  She denies any headache.  No dizziness.  No nausea or vomiting.  No difficulty with her speech.  No numbness or weakness to her extremities.  No change in her balance.  She had similar symptoms about 2 months ago but only lasted about 10 minutes and went away.  She does have a history of carotid artery stenosis and had a right-sided carotid endarterectomy about 3 weeks ago.  She has had no problems since that time.  She has 60% stenosis of her left carotid artery.     Past Medical History:  Diagnosis Date  . Arthritis   . Carotid arterial disease (HCC)   . High cholesterol   . Hypertension   . Rheumatoid arteritis   . Rheumatoid arthritis [facility]     Patient Active Problem List   Diagnosis Date Noted  . Knee pain, left 08/05/2015  . Allergic rhinitis 07/15/2015  . Carotid artery narrowing 07/15/2015  . Peripheral vascular disease (HCC) 07/15/2015  . Renal artery stenosis (HCC) 07/15/2015  . 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 Benign essential HTN 06/13/2015  . HLD (hyperlipidemia) 06/13/2015  . Rheumatoid arthritis (HCC) 06/13/2015  . Urgency of micturation 05/03/2015  . FOM (frequency of micturition) 05/03/2015  . Pedal edema 12/04/2014  . Dysesthesia 12/04/2014  . Nuclear sclerotic cataract 09/20/2014  . Lumbosacral radiculopathy 08/16/2014  . Spondylolisthesis at L5-S1 level 08/07/2014  . Neck pain 08/07/2014  . Chronic rheumatic arthritis (HCC) 08/07/2014  .  Neuralgia neuritis, sciatic nerve 08/03/2014  . Aseptic bony necrosis (HCC) 08/03/2014  . Aseptic necrosis of bone (HCC) 08/03/2014    Past Surgical History:  Procedure Laterality Date  . CAROTID ENDARTERECTOMY    . CORONARY ANGIOPLASTY WITH STENT PLACEMENT       OB History   None      Home Medications    Prior to Admission medications   Medication Sig Start Date End Date Taking? Authorizing Provider  amLODipine-valsartan (EXFORGE) 5-320 MG tablet Take 0.5 tablets by mouth daily.  01/08/16 01/17/18 Yes [provider]  aspirin 81 MG tablet Take 81 mg by mouth at bedtime.    Yes [provider]  busPIRone (BUSPAR) 15 MG tablet Take 15 mg by mouth 3 (three) times daily.  08/01/14  Yes [provider]  clopidogrel (PLAVIX) 75 MG tablet Take 75 mg by mouth daily.   Yes [provider]  ezetimibe (ZETIA) 10 MG tablet Take 10 mg by mouth at bedtime.    Yes [provider]  gabapentin (NEURONTIN) 800 MG tablet Take 1 tablet (800 mg total) by mouth 3 (three) times daily. 12/14/17  Yes Sater, Pearletha Furl, MD  NITROSTAT 0.4 MG SL tablet Place 0.4 mg under the tongue every 5 (five) minutes as needed for chest pain.  07/08/14  Yes [provider]  pravastatin (PRAVACHOL) 40 MG tablet Take 40 mg by mouth daily.   Yes [provider]  trospium (  SANCTURA) 20 MG tablet Take 20 mg by mouth at bedtime.   Yes [provider]  hydrochlorothiazide (HYDRODIURIL) 25 MG tablet Take 1 tablet (25 mg total) by mouth daily. 07/14/15 04/02/16  Alvira Monday, MD  meloxicam (MOBIC) 7.5 MG tablet Take 1 tablet (7.5 mg total) by mouth daily. 12/14/17   Sater, Pearletha Furl, MD    Family History Family History  Problem Relation Age of Onset  . Heart disease Mother   . Heart disease Father     Social History Social History   Tobacco Use  . Smoking status: Former Games developer  . Smokeless tobacco: Never Used  Substance Use Topics  . Alcohol use: No     Alcohol/week: 0.0 standard drinks  . Drug use: No     Allergies   Nickel; Latex; and Lamictal [lamotrigine]   Review of Systems Review of Systems  Constitutional: Negative for chills, diaphoresis, fatigue and fever.  HENT: Negative for congestion, rhinorrhea and sneezing.   Eyes: Positive for discharge and visual disturbance. Negative for photophobia, pain and redness.  Respiratory: Negative for cough, chest tightness and shortness of breath.   Cardiovascular: Negative for chest pain and leg swelling.  Gastrointestinal: Negative for abdominal pain, blood in stool, diarrhea, nausea and vomiting.  Genitourinary: Negative for difficulty urinating, flank pain, frequency and hematuria.  Musculoskeletal: Negative for arthralgias and back pain.  Skin: Negative for rash.  Neurological: Negative for dizziness, speech difficulty, weakness, numbness and headaches.     Physical Exam Updated Vital Signs BP 107/87   Pulse 86   Temp 98.4 F (36.9 C) (Oral)   Resp 16   Ht 5\' 8"  (1.727 m)   Wt 73.9 kg   SpO2 100%   BMI 24.77 kg/m   Physical Exam  Constitutional: She is oriented to person, place, and time. She appears well-developed and well-nourished.  HENT:  Head: Normocephalic and atraumatic.  Eyes: Pupils are equal, round, and reactive to light. Conjunctivae and EOM are normal.  Visual fields full to confrontation  Neck: Normal range of motion. Neck supple.  Cardiovascular: Normal rate, regular rhythm and normal heart sounds.  Pulmonary/Chest: Effort normal and breath sounds normal. No respiratory distress. She has no wheezes. She has no rales. She exhibits no tenderness.  Abdominal: Soft. Bowel sounds are normal. There is no tenderness. There is no rebound and no guarding.  Musculoskeletal: Normal range of motion. She exhibits no edema.  Lymphadenopathy:    She has no cervical adenopathy.  Neurological: She is alert and oriented to person, place, and time.  Motor 5/5 all  extremities Sensation grossly intact to LT all extremities Finger to Nose intact, no pronator drift CN II-XII grossly intact Gait normal   Skin: Skin is warm and dry. No rash noted.  Psychiatric: She has a normal mood and affect.     ED Treatments / Results  Labs (all labs ordered are listed, but only abnormal results are displayed) Labs Reviewed  BASIC METABOLIC PANEL - Abnormal; Notable for the following components:      Result Value   Glucose, Bld 111 (*)    All other components within normal limits  CBC WITH DIFFERENTIAL/PLATELET - Abnormal; Notable for the following components:   RBC 3.30 (*)    Hemoglobin 9.5 (*)    HCT 28.7 (*)    All other components within normal limits  URINALYSIS, ROUTINE W REFLEX MICROSCOPIC - Abnormal; Notable for the following components:   APPearance TURBID (*)    Specific Gravity, Urine <  1.005 (*)    Hgb urine dipstick SMALL (*)    Nitrite POSITIVE (*)    Leukocytes, UA LARGE (*)    All other components within normal limits  URINALYSIS, MICROSCOPIC (REFLEX) - Abnormal; Notable for the following components:   Bacteria, UA MANY (*)    All other components within normal limits  URINE CULTURE  TROPONIN I  OCCULT BLOOD X 1 CARD TO LAB, STOOL    EKG EKG Interpretation  Date/Time:  Monday January 17 2018 12:37:29 EDT Ventricular Rate:  99 PR Interval:    QRS Duration: 89 QT Interval:  330 QTC Calculation: 424 R Axis:   67 Text Interpretation:  Sinus rhythm Left atrial enlargement Probable LVH with secondary repol abnrm more pronounced ST depression inferior/laterally Confirmed by Rolan Bucco [phone]) on 01/17/2018 1:37:36 PM   Radiology No results found.  Procedures Procedures (including critical care time)  Medications Ordered in ED Medications  sodium chloride 0.9 % bolus 500 mL (500 mLs Intravenous New Bag/Given 01/17/18 1438)  cefTRIAXone (ROCEPHIN) 1 g in sodium chloride 0.9 % 100 mL IVPB (has no administration in time range)       Initial Impression / Assessment and Plan / ED Course  I have reviewed the triage vital signs and the nursing notes.  Pertinent labs & imaging results that were available during my care of the patient were reviewed by me and considered in my medical decision making (see chart for details).    Patient is a 72 year old female who presents with blurry vision.  She does not have any double vision or visual field deficits.  No other symptoms that we more concerning for stroke.  She does not have a headache.  She has no ataxia on exam.  She has no lightheadedness or dizziness on ambulation.  She does have evidence of urinary tract infection and she does have some anemia with a hemoglobin of 9.5.  She is not tachycardic.  Her blood pressure is normal.  She is not symptomatic on ambulation.  She has no significant visual loss on visual acuity testing.  She states that she feels fine and is ready to go home.  I gave her a prescription for Keflex for UTI.  Anemia may be related to her recent surgery.  Her Hemoccult was negative.  She had some slight increase in ST depression but she does not have any symptoms that would be more concerning for acute coronary syndrome.  Her troponin is negative.  I did encourage her to have close follow-up within the next 1 to 2 days with her PCP.  She is can call today for an appointment.  She was given strict return precautions.  PT has contacted her doctor's office who is arranging for her to be seen tomorrow.  Final Clinical Impressions(s) / ED Diagnoses   Final diagnoses:  Acute cystitis without hematuria  Blurred vision  Anemia, unspecified type    ED Discharge Orders    None       Rolan Bucco, MD 01/17/18 0962    Rolan Bucco, MD 01/17/18 1527

## 2018-01-17 NOTE — Discharge Instructions (Signed)
You need to follow up with your doctor to recheck your hemoglobin (it was

## 2018-01-17 NOTE — ED Notes (Signed)
Lab notified of orders for troponin

## 2018-01-17 NOTE — ED Notes (Signed)
ED Provider at bedside. 

## 2018-01-17 NOTE — ED Triage Notes (Signed)
Pt c/o blurred vision x 25 minutes-same approx 3 mos ago-did not seek medical attention-pt drove from [place] to home to here after sx started-NAD-steady gait

## 2018-01-19 LAB — URINE CULTURE: Culture: >=100000 — AB

## 2018-01-20 ENCOUNTER — Telehealth: Payer: Self-pay | Admitting: *Deleted

## 2018-01-20 NOTE — Progress Notes (Signed)
ED Antimicrobial Stewardship Positive Culture Follow Up   Sara Villanueva is an 72 y.o. female who presented to [facility] on 01/17/2018 with a chief complaint of  Chief Complaint  Patient presents with  . Blurred Vision    Recent Results (from the past 720 hour(s))  Urine culture     Status: Abnormal   Collection Time: 01/17/18 12:27 PM  Result Value Ref Range Status   Specimen Description   Final    URINE, CLEAN CATCH Performed at [facility], [address] [place], [place] [address]    Special Requests   Final    NONE Performed at [facility], [address] [place], [place] [address]    Culture (A)  Final    >=100,000 COLONIES/mL ESCHERICHIA COLI Confirmed Extended Spectrum Beta-Lactamase Producer (ESBL).  In bloodstream infections from ESBL organisms, carbapenems are preferred over piperacillin/tazobactam. They are shown to have a lower risk of mortality.    Report Status 01/19/2018 FINAL  Final   Organism ID, Bacteria ESCHERICHIA COLI (A)  Final      Susceptibility   Escherichia coli - MIC*    AMPICILLIN >=32 RESISTANT Resistant     CEFAZOLIN >=64 RESISTANT Resistant     CEFTRIAXONE >=64 RESISTANT Resistant     CIPROFLOXACIN >=4 RESISTANT Resistant     GENTAMICIN <=1 SENSITIVE Sensitive     IMIPENEM <=0.25 SENSITIVE Sensitive     NITROFURANTOIN <=16 SENSITIVE Sensitive     TRIMETH/SULFA >=320 RESISTANT Resistant     AMPICILLIN/SULBACTAM >=32 RESISTANT Resistant     PIP/TAZO <=4 SENSITIVE Sensitive     Extended ESBL POSITIVE Resistant     * >=100,000 COLONIES/mL ESCHERICHIA COLI    [x]  Treated with Keflex, organism resistant to prescribed antimicrobial  New antibiotic prescription: Stop Keflex. Start Macrobid 100mg  PO BID x 5 days  ED Provider: , PA-C   01/20/2018, 9:35 AM Clinical Pharmacist Monday - Friday phone -  [phone] Saturday - Sunday phone - [phone]

## 2018-01-20 NOTE — Telephone Encounter (Signed)
Post ED Visit - Positive Culture Follow-up: Successful Patient Follow-Up  Culture assessed and recommendations reviewed by:  []  , Pharm.D. []  Enzo Bi, Pharm.D., BCPS AQ-ID []  , Pharm.D., BCPS []  Celedonio Miyamoto, .D., BCPS []  [place], .D., BCPS, AAHIVP []  Georgina Pillion, Pharm.D., BCPS, AAHIVP []  [address], PharmD, BCPS []  , PharmD, BCPS []  [place], PharmD, BCPS []  [address], PharmD  Positive urine culture  []  Patient discharged without antimicrobial prescription and treatment is now indicated [x]  Organism is resistant to prescribed ED discharge antimicrobial  []  Patient with positive blood cultures  Saw PCP on 08/21 and was given prescription and told to Stop Keflex.  Changes discussed with ED provider Estella Husk, PA-C    01/20/2018  1031am   Lysle Pearl 01/20/2018, 10:29 AM

## 2018-01-27 ENCOUNTER — Telehealth: Payer: Self-pay | Admitting: Neurology

## 2018-01-27 NOTE — Telephone Encounter (Signed)
Pt's left leg is painful with walking for the past 3 weeks. She is taking meloxicam, tylenol and gabapentin 3 x day but feels it is not helping. Please call to advise

## 2018-01-27 NOTE — Telephone Encounter (Signed)
LMOM.  Will make sure she is taking Gabapentin, Meloxicam and wearing compression hose as rx'd/fim

## 2018-01-28 NOTE — Telephone Encounter (Signed)
Pt has returned the call to RN Faith. Please call

## 2018-01-28 NOTE — Telephone Encounter (Signed)
Spoke with Mrs. Sara Villanueva. She c/o same pain in left leg, better in the am with waking, worse in the afternoons. Sts. taking Gabapentin, Meloxicam as rx'd and wearing compression hose daily.  No new pain. No new injuries.  Sts. most recent esi's have not helped. She acknowledges that she has exacerbations of this pain (good days, bad days).  She is going to continues current med regimen and try alternative pain relief measures (ice/heat, rest, elevation, massage) and call back if exacerbated pain beyond her baseline persists/fim

## 2018-02-05 ENCOUNTER — Emergency Department (HOSPITAL_BASED_OUTPATIENT_CLINIC_OR_DEPARTMENT_OTHER)
Admission: EM | Admit: 2018-02-05 | Discharge: 2018-02-06 | Disposition: A | Discharge status: Home / Self Care | Payer: Medicare Other | Attending: Emergency Medicine | Admitting: Emergency Medicine

## 2018-02-05 ENCOUNTER — Encounter (HOSPITAL_BASED_OUTPATIENT_CLINIC_OR_DEPARTMENT_OTHER): Payer: Self-pay | Admitting: *Deleted

## 2018-02-05 ENCOUNTER — Other Ambulatory Visit: Payer: Self-pay

## 2018-02-05 ENCOUNTER — Emergency Department (HOSPITAL_BASED_OUTPATIENT_CLINIC_OR_DEPARTMENT_OTHER): Payer: Medicare Other

## 2018-02-05 DIAGNOSIS — Z87891 Personal history of nicotine dependence: Secondary | ICD-10-CM | POA: Insufficient documentation

## 2018-02-05 DIAGNOSIS — Z7982 Long term (current) use of aspirin: Secondary | ICD-10-CM | POA: Insufficient documentation

## 2018-02-05 DIAGNOSIS — Z91040 Latex allergy status: Secondary | ICD-10-CM | POA: Diagnosis not present

## 2018-02-05 DIAGNOSIS — E861 Hypovolemia: Secondary | ICD-10-CM

## 2018-02-05 DIAGNOSIS — I959 Hypotension, unspecified: Secondary | ICD-10-CM | POA: Insufficient documentation

## 2018-02-05 DIAGNOSIS — Z955 Presence of coronary angioplasty implant and graft: Secondary | ICD-10-CM | POA: Insufficient documentation

## 2018-02-05 DIAGNOSIS — I10 Essential (primary) hypertension: Secondary | ICD-10-CM | POA: Insufficient documentation

## 2018-02-05 DIAGNOSIS — I9589 Other hypotension: Secondary | ICD-10-CM

## 2018-02-05 DIAGNOSIS — Z7902 Long term (current) use of antithrombotics/antiplatelets: Secondary | ICD-10-CM | POA: Insufficient documentation

## 2018-02-05 LAB — COMPREHENSIVE METABOLIC PANEL
ALBUMIN: 3.2 g/dL — AB (ref 3.5–5.0)
ALK PHOS: 73 U/L (ref 38–126)
ALT: 19 U/L (ref 0–44)
ANION GAP: 8 (ref 5–15)
AST: 26 U/L (ref 15–41)
BUN: 18 mg/dL (ref 8–23)
CALCIUM: 9 mg/dL (ref 8.9–10.3)
CO2: 24 mmol/L (ref 22–32)
Chloride: 108 mmol/L (ref 98–111)
Creatinine, Ser: 1.02 mg/dL — ABNORMAL HIGH (ref 0.44–1.00)
GFR calc Af Amer: >60 mL/min (ref >60)
GFR calc non Af Amer: 53 mL/min — ABNORMAL LOW (ref >60)
GLUCOSE: 131 mg/dL — AB (ref 70–99)
Potassium: 4 mmol/L (ref 3.5–5.1)
SODIUM: 140 mmol/L (ref 135–145)
Total Bilirubin: 0.3 mg/dL (ref 0.3–1.2)
Total Protein: 7 g/dL (ref 6.5–8.1)

## 2018-02-05 LAB — LIPASE, BLOOD: Lipase: 29 U/L (ref 11–51)

## 2018-02-05 LAB — CBC WITH DIFFERENTIAL/PLATELET
BASOS PCT: 0 %
Basophils Absolute: 0 10*3/uL (ref 0.0–0.1)
EOS ABS: 0.2 10*3/uL (ref 0.0–0.7)
Eosinophils Relative: 3 %
HCT: 30.4 % — ABNORMAL LOW (ref 36.0–46.0)
HEMOGLOBIN: 9.9 g/dL — AB (ref 12.0–15.0)
Lymphocytes Relative: 18 %
Lymphs Abs: 1.3 10*3/uL (ref 0.7–4.0)
MCH: 27.3 pg (ref 26.0–34.0)
MCHC: 32.6 g/dL (ref 30.0–36.0)
MCV: 84 fL (ref 78.0–100.0)
Monocytes Absolute: 0.9 10*3/uL (ref 0.1–1.0)
Monocytes Relative: 12 %
NEUTROS PCT: 67 %
Neutro Abs: 5.1 10*3/uL (ref 1.7–7.7)
Platelets: 270 10*3/uL (ref 150–400)
RBC: 3.62 MIL/uL — AB (ref 3.87–5.11)
RDW: 15.2 % (ref 11.5–15.5)
WBC: 7.5 10*3/uL (ref 4.0–10.5)

## 2018-02-05 LAB — TROPONIN I: Troponin I: <0.03 ng/mL (ref <0.03)

## 2018-02-05 MED ORDER — SODIUM CHLORIDE 0.9 % IV BOLUS
1000.0000 mL | Freq: Once | INTRAVENOUS | Status: AC
  Administered 2018-02-05: 1000 mL via INTRAVENOUS

## 2018-02-05 NOTE — ED Triage Notes (Signed)
Pt arrived via [facility] with c/o of hypotension and generally weak. States she has had an ongoing issue with hypotension for the past 3 months and has seen her MD for same. Denies any cough or fever or urinary symptoms. States she had an right endarterectomy one month ago at [facility]. The only concern today is general weakness and low blood pressure. blood sugar for EMS was 156. Sinus 80s on the monitor. Denies cp/sob.

## 2018-02-05 NOTE — ED Notes (Signed)
Pts husband notified of pts d/c. Husband en route here. Pt waiting in room.

## 2018-02-05 NOTE — ED Provider Notes (Signed)
[facility] EMERGENCY DEPARTMENT Provider Note   CSN: [id number] Arrival date & time: 02/05/18  2035     History   Chief Complaint Chief Complaint  Patient presents with  . low blood pressure    HPI Sara Villanueva is a 72 y.o. female history of hypertension, recent carotid endarterectomy resulting in hypotension who presenting with dizziness, hypotension.  Patient states that since her endarterectomy, her blood pressure has been running low.  She states that especially in the afternoons her blood pressure runs in the 80s to 90s.  She was told to cut her Norvasc in half to 2.5 mg but has not been taking it for the last several days.  She states that she was feeling lightheaded dizzy today and ate some food and had several episodes of vomiting.  Then felt more dizzy and called EMS.  Patient was seen by PCP yesterday and her blood pressure was in the 130s at that time.  Denies any fevers or chills or cough or passing out.  The history is provided by the patient.    Past Medical History:  Diagnosis Date  . Arthritis   . Carotid arterial disease (HCC)   . High cholesterol   . Hypertension   . Rheumatoid arteritis   . Rheumatoid arthritis [facility])     Patient Active Problem List   Diagnosis Date Noted  . Knee pain, left 08/05/2015  . Allergic rhinitis 07/15/2015  . Carotid artery narrowing 07/15/2015  . Peripheral vascular disease (HCC) 07/15/2015  . Renal artery stenosis (HCC) 07/15/2015  . 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 Benign essential HTN 06/13/2015  . HLD (hyperlipidemia) 06/13/2015  . Rheumatoid arthritis (HCC) 06/13/2015  . Urgency of micturation 05/03/2015  . FOM (frequency of micturition) 05/03/2015  . Pedal edema 12/04/2014  . Dysesthesia 12/04/2014  . Nuclear sclerotic cataract 09/20/2014  . Lumbosacral radiculopathy 08/16/2014  . Spondylolisthesis at L5-S1 level 08/07/2014  . Neck pain 08/07/2014  . Chronic rheumatic arthritis (HCC) 08/07/2014    . Neuralgia neuritis, sciatic nerve 08/03/2014  . Aseptic bony necrosis (HCC) 08/03/2014  . Aseptic necrosis of bone (HCC) 08/03/2014    Past Surgical History:  Procedure Laterality Date  . CAROTID ENDARTERECTOMY    . CORONARY ANGIOPLASTY WITH STENT PLACEMENT       OB History   None      Home Medications    Prior to Admission medications   Medication Sig Start Date End Date Taking? Authorizing Provider  amLODipine-valsartan (EXFORGE) 5-320 MG tablet Take 0.5 tablets by mouth daily.  01/08/16 01/17/18  [provider]  aspirin 81 MG tablet Take 81 mg by mouth at bedtime.     [provider]  busPIRone (BUSPAR) 15 MG tablet Take 15 mg by mouth 3 (three) times daily.  08/01/14   [provider]  cephALEXin (KEFLEX) 500 MG capsule Take 1 capsule (500 mg total) by mouth 2 (two) times daily. 01/17/18   Rolan Bucco, MD  clopidogrel (PLAVIX) 75 MG tablet Take 75 mg by mouth daily.    [provider]  ezetimibe (ZETIA) 10 MG tablet Take 10 mg by mouth at bedtime.     [provider]  gabapentin (NEURONTIN) 800 MG tablet Take 1 tablet (800 mg total) by mouth 3 (three) times daily. 12/14/17   Sater, Pearletha Furl, MD  hydrochlorothiazide (HYDRODIURIL) 25 MG tablet Take 1 tablet (25 mg total) by mouth daily. 07/14/15 04/02/16  Alvira Monday, MD  meloxicam (MOBIC) 7.5 MG tablet Take 1  tablet (7.5 mg total) by mouth daily. 12/14/17   Sater, Pearletha Furl, MD  NITROSTAT 0.4 MG SL tablet Place 0.4 mg under the tongue every 5 (five) minutes as needed for chest pain.  07/08/14   [provider]  pravastatin (PRAVACHOL) 40 MG tablet Take 40 mg by mouth daily.    [provider]  trospium (SANCTURA) 20 MG tablet Take 20 mg by mouth at bedtime.    [provider]    Family History Family History  Problem Relation Age of Onset  . Heart disease Mother   . Heart disease Father     Social History Social History   Tobacco Use  . Smoking  status: Former Games developer  . Smokeless tobacco: Never Used  Substance Use Topics  . Alcohol use: No    Alcohol/week: 0.0 standard drinks  . Drug use: No     Allergies   Nickel; Latex; and Lamictal [lamotrigine]   Review of Systems Review of Systems  Neurological: Positive for dizziness.  All other systems reviewed and are negative.    Physical Exam Updated Vital Signs BP 107/66 (BP Location: Right Arm)   Pulse 80   Temp 98.2 F (36.8 C) (Oral)   Resp 14   Ht 5\' 8"  (1.727 m)   Wt 73.9 kg   SpO2 100%   BMI 24.78 kg/m   Physical Exam  Constitutional: She is oriented to person, place, and time.  Slightly dehydrated   HENT:  Head: Normocephalic.  MM slightly dry   Eyes: Pupils are equal, round, and reactive to light. Conjunctivae and EOM are normal.  Neck: Normal range of motion. Neck supple.  Cardiovascular: Normal rate, regular rhythm and normal heart sounds.  Pulmonary/Chest: Effort normal and breath sounds normal. No stridor. No respiratory distress. She has no wheezes.  Abdominal: Soft. Bowel sounds are normal. She exhibits no distension. There is no tenderness.  Musculoskeletal: Normal range of motion.  Neurological: She is alert and oriented to person, place, and time. No cranial nerve deficit. Coordination normal.  Skin: Skin is warm. Capillary refill takes less than 2 seconds.  Psychiatric: She has a normal mood and affect.  Nursing note and vitals reviewed.    ED Treatments / Results  Labs (all labs ordered are listed, but only abnormal results are displayed) Labs Reviewed  CBC WITH DIFFERENTIAL/PLATELET  COMPREHENSIVE METABOLIC PANEL  TROPONIN I  URINALYSIS, ROUTINE W REFLEX MICROSCOPIC  LIPASE, BLOOD    EKG EKG Interpretation  Date/Time:  Saturday February 05 2018 21:14:39 EDT Ventricular Rate:  80 PR Interval:    QRS Duration: 96 QT Interval:  370 QTC Calculation: 427 R Axis:   65 Text Interpretation:  Sinus rhythm Biatrial enlargement LVH  with secondary repolarization abnormality No significant change since last tracing Confirmed by Richardean Canal [phone]) on 02/05/2018 9:28:21 PM   Radiology No results found.  Procedures Procedures (including critical care time)  Medications Ordered in ED Medications  sodium chloride 0.9 % bolus 1,000 mL (has no administration in time range)     Initial Impression / Assessment and Plan / ED Course  I have reviewed the triage vital signs and the nursing notes.  Pertinent labs & imaging results that were available during my care of the patient were reviewed by me and considered in my medical decision making (see chart for details).     Sara Villanueva is a 72 y.o. female here with hypotension. Patient has been running low blood pressures for the last several  weeks. I think this can be side effect of endarterectomy. She has no fever or urinary symptoms or cough to suggest that she is septic. She did have some vomiting and could have vasovagal event. Will check labs, EKG, orthostatic. Will hydrate and reassess.   11:29 PM Labs at baseline. BP up to 117/73 with IVF. I think likely combination of baseline hypotension and dehydration. No vomiting in the ED. Will dc home. Told her to hold her BP meds for now.    Final Clinical Impressions(s) / ED Diagnoses   Final diagnoses:  None    ED Discharge Orders    None       Charlynne Pander, MD 02/05/18 2330

## 2018-02-05 NOTE — Discharge Instructions (Signed)
Stay hydrated.   Hold your norvasc if your blood pressure is less than 140.   See your doctor  Return to ER if you have worse dizziness, passing out, chest pain.

## 2018-02-05 NOTE — ED Notes (Addendum)
Please call family when ready to be picked up. Elaina Pattee cell [phone] or [phone].

## 2018-03-15 ENCOUNTER — Telehealth: Payer: Self-pay | Admitting: Neurology

## 2018-03-15 NOTE — Telephone Encounter (Signed)
Pt requesting a call stating she would like to discuss medication gabapentin (NEURONTIN) 800 MG tablet did not wish to discuss further with me

## 2018-03-15 NOTE — Telephone Encounter (Signed)
Spoke with pt. She sts. she has been having consistent episodes of hypotension in the afternoons, after lunch. Sts. she wonders if Sara Villanueva may be causing this. Sts. she would like to decrease the amt. of Gabaepentin she is taking to see if pm BP improves.  I explained it is fine to decrease the dose. She will take 1/2 tablet up to tid as needed for pain. She will call back if needed/fim

## 2018-03-28 DIAGNOSIS — R195 Other fecal abnormalities: Secondary | ICD-10-CM | POA: Insufficient documentation

## 2018-05-03 ENCOUNTER — Emergency Department (HOSPITAL_BASED_OUTPATIENT_CLINIC_OR_DEPARTMENT_OTHER): Payer: Medicare Other

## 2018-05-03 ENCOUNTER — Other Ambulatory Visit: Payer: Self-pay

## 2018-05-03 ENCOUNTER — Emergency Department (HOSPITAL_BASED_OUTPATIENT_CLINIC_OR_DEPARTMENT_OTHER)
Admission: EM | Admit: 2018-05-03 | Discharge: 2018-05-03 | Disposition: A | Discharge status: Home / Self Care | Payer: Medicare Other | Attending: Emergency Medicine | Admitting: Emergency Medicine

## 2018-05-03 ENCOUNTER — Encounter (HOSPITAL_BASED_OUTPATIENT_CLINIC_OR_DEPARTMENT_OTHER): Payer: Self-pay | Admitting: *Deleted

## 2018-05-03 DIAGNOSIS — Z87891 Personal history of nicotine dependence: Secondary | ICD-10-CM | POA: Diagnosis not present

## 2018-05-03 DIAGNOSIS — Z7901 Long term (current) use of anticoagulants: Secondary | ICD-10-CM | POA: Insufficient documentation

## 2018-05-03 DIAGNOSIS — I10 Essential (primary) hypertension: Secondary | ICD-10-CM | POA: Insufficient documentation

## 2018-05-03 DIAGNOSIS — Z7982 Long term (current) use of aspirin: Secondary | ICD-10-CM | POA: Insufficient documentation

## 2018-05-03 DIAGNOSIS — M069 Rheumatoid arthritis, unspecified: Secondary | ICD-10-CM | POA: Insufficient documentation

## 2018-05-03 DIAGNOSIS — Z955 Presence of coronary angioplasty implant and graft: Secondary | ICD-10-CM | POA: Insufficient documentation

## 2018-05-03 DIAGNOSIS — Z79899 Other long term (current) drug therapy: Secondary | ICD-10-CM | POA: Diagnosis not present

## 2018-05-03 DIAGNOSIS — K5900 Constipation, unspecified: Secondary | ICD-10-CM | POA: Insufficient documentation

## 2018-05-03 DIAGNOSIS — R1084 Generalized abdominal pain: Secondary | ICD-10-CM | POA: Diagnosis present

## 2018-05-03 DIAGNOSIS — Z91040 Latex allergy status: Secondary | ICD-10-CM | POA: Insufficient documentation

## 2018-05-03 LAB — COMPREHENSIVE METABOLIC PANEL
ALT: 20 U/L (ref 0–44)
ANION GAP: 6 (ref 5–15)
AST: 22 U/L (ref 15–41)
Albumin: 3.6 g/dL (ref 3.5–5.0)
Alkaline Phosphatase: 67 U/L (ref 38–126)
BUN: 16 mg/dL (ref 8–23)
CO2: 26 mmol/L (ref 22–32)
CREATININE: 0.95 mg/dL (ref 0.44–1.00)
Calcium: 9.4 mg/dL (ref 8.9–10.3)
Chloride: 106 mmol/L (ref 98–111)
GFR, EST NON AFRICAN AMERICAN: 59 mL/min — AB (ref >60)
Glucose, Bld: 118 mg/dL — ABNORMAL HIGH (ref 70–99)
Potassium: 3.7 mmol/L (ref 3.5–5.1)
Sodium: 138 mmol/L (ref 135–145)
TOTAL PROTEIN: 7.2 g/dL (ref 6.5–8.1)
Total Bilirubin: 0.5 mg/dL (ref 0.3–1.2)

## 2018-05-03 LAB — CBC
HCT: 37.8 % (ref 36.0–46.0)
Hemoglobin: 11.7 g/dL — ABNORMAL LOW (ref 12.0–15.0)
MCH: 27.1 pg (ref 26.0–34.0)
MCHC: 31 g/dL (ref 30.0–36.0)
MCV: 87.7 fL (ref 80.0–100.0)
NRBC: 0 % (ref 0.0–0.2)
Platelets: 233 10*3/uL (ref 150–400)
RBC: 4.31 MIL/uL (ref 3.87–5.11)
RDW: 23 % — ABNORMAL HIGH (ref 11.5–15.5)
WBC: 6 10*3/uL (ref 4.0–10.5)

## 2018-05-03 LAB — URINALYSIS, ROUTINE W REFLEX MICROSCOPIC
Bilirubin Urine: NEGATIVE
Glucose, UA: NEGATIVE mg/dL
Hgb urine dipstick: NEGATIVE
Ketones, ur: NEGATIVE mg/dL
Leukocytes, UA: NEGATIVE
NITRITE: NEGATIVE
Protein, ur: NEGATIVE mg/dL
SPECIFIC GRAVITY, URINE: 1.01 (ref 1.005–1.030)
pH: 6.5 (ref 5.0–8.0)

## 2018-05-03 LAB — LIPASE, BLOOD: Lipase: 34 U/L (ref 11–51)

## 2018-05-03 MED ORDER — IOPAMIDOL (ISOVUE-300) INJECTION 61%: 30.0000 mL | Freq: Once | INTRAVENOUS | Status: DC | PRN

## 2018-05-03 MED ORDER — IOPAMIDOL (ISOVUE-300) INJECTION 61%
100.0000 mL | Freq: Once | INTRAVENOUS | Status: AC | PRN
  Administered 2018-05-03: 100 mL via INTRAVENOUS

## 2018-05-03 NOTE — ED Notes (Signed)
PT states understanding of care given, follow up care. Pt have no more questions at this time. PT ambulated from ED to car with a steady gait.

## 2018-05-03 NOTE — ED Notes (Signed)
ED Provider at bedside. 

## 2018-05-03 NOTE — ED Triage Notes (Signed)
Abdominal and back pain x days. Blood in her stools in October. She had an upper GI study that showed a small leaking blood vessel that is suppose to be repaired next week.

## 2018-05-03 NOTE — ED Provider Notes (Signed)
[facility] EMERGENCY DEPARTMENT Provider Note   CSN: [id number] Arrival date & time: 05/03/18  1500     History   Chief Complaint Chief Complaint  Patient presents with  . Abdominal Pain  . Back Pain    HPI Sara Villanueva is a 72 y.o. female.   Abdominal Pain   This is a new problem. The current episode started more than 1 week ago. The problem occurs constantly. The problem has been gradually improving. The pain is associated with eating. The pain is located in the epigastric region and LUQ. The quality of the pain is aching and sharp. The pain is moderate. Associated symptoms include melena and nausea. Pertinent negatives include anorexia and fever. Nothing aggravates the symptoms. Nothing relieves the symptoms. Past workup includes GI consult.  Back Pain   Associated symptoms include abdominal pain. Pertinent negatives include no fever.    Past Medical History:  Diagnosis Date  . Arthritis   . Carotid arterial disease (HCC)   . High cholesterol   . Hypertension   . Rheumatoid arteritis (HCC)   . Rheumatoid arthritis [facility])     Patient Active Problem List   Diagnosis Date Noted  . Knee pain, left 08/05/2015  . Allergic rhinitis 07/15/2015  . Carotid artery narrowing 07/15/2015  . Peripheral vascular disease (HCC) 07/15/2015  . Renal artery stenosis (HCC) 07/15/2015  . 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 Benign essential HTN 06/13/2015  . HLD (hyperlipidemia) 06/13/2015  . Rheumatoid arthritis (HCC) 06/13/2015  . Urgency of micturation 05/03/2015  . FOM (frequency of micturition) 05/03/2015  . Pedal edema 12/04/2014  . Dysesthesia 12/04/2014  . Nuclear sclerotic cataract 09/20/2014  . Lumbosacral radiculopathy 08/16/2014  . Spondylolisthesis at L5-S1 level 08/07/2014  . Neck pain 08/07/2014  . Chronic rheumatic arthritis (HCC) 08/07/2014  . Neuralgia neuritis, sciatic nerve 08/03/2014  . Aseptic bony necrosis (HCC) 08/03/2014  . Aseptic  necrosis of bone (HCC) 08/03/2014    Past Surgical History:  Procedure Laterality Date  . CAROTID ENDARTERECTOMY    . CORONARY ANGIOPLASTY WITH STENT PLACEMENT       OB History   None      Home Medications    Prior to Admission medications   Medication Sig Start Date End Date Taking? Authorizing Provider  amLODipine-valsartan (EXFORGE) 5-320 MG tablet Take 0.5 tablets by mouth daily.  01/08/16 01/17/18  [provider]  aspirin 81 MG tablet Take 81 mg by mouth at bedtime.     [provider]  busPIRone (BUSPAR) 15 MG tablet Take 15 mg by mouth 3 (three) times daily.  08/01/14   [provider]  cephALEXin (KEFLEX) 500 MG capsule Take 1 capsule (500 mg total) by mouth 2 (two) times daily. 01/17/18   Rolan Bucco, MD  clopidogrel (PLAVIX) 75 MG tablet Take 75 mg by mouth daily.    [provider]  ezetimibe (ZETIA) 10 MG tablet Take 10 mg by mouth at bedtime.     [provider]  gabapentin (NEURONTIN) 800 MG tablet Take 1 tablet (800 mg total) by mouth 3 (three) times daily. 12/14/17   Sater, Pearletha Furl, MD  hydrochlorothiazide (HYDRODIURIL) 25 MG tablet Take 1 tablet (25 mg total) by mouth daily. 07/14/15 04/02/16  Alvira Monday, MD  meloxicam (MOBIC) 7.5 MG tablet Take 1 tablet (7.5 mg total) by mouth daily. 12/14/17   Sater, Pearletha Furl, MD  NITROSTAT 0.4 MG SL tablet Place 0.4 mg under the tongue every 5 (five) minutes as needed  for chest pain.  07/08/14   [provider]  pravastatin (PRAVACHOL) 40 MG tablet Take 40 mg by mouth daily.    [provider]  trospium (SANCTURA) 20 MG tablet Take 20 mg by mouth at bedtime.    [provider]    Family History Family History  Problem Relation Age of Onset  . Heart disease Mother   . Heart disease Father     Social History Social History   Tobacco Use  . Smoking status: Former Smoker  . Smokeless tobacco: Never Used  Substance Use Topics  . Alcohol use: No     Alcohol/week: 0.0 standard drinks  . Drug use: No     Allergies   Nickel; Latex; and Lamictal [lamotrigine]   Review of Systems Review of Systems  Constitutional: Negative for fever.  Gastrointestinal: Positive for abdominal pain, melena and nausea. Negative for anorexia.  Musculoskeletal: Positive for back pain.  All other systems reviewed and are negative.    Physical Exam Updated Vital Signs BP (!) 174/100 (BP Location: Right Arm)   Pulse 100   Temp 98.1 F (36.7 C) (Oral)   Resp 18   Ht 5\' 8"  (1.727 m)   Wt 70.8 kg   SpO2 100%   BMI 23.72 kg/m   Physical Exam  Constitutional: She is oriented to person, place, and time. She appears well-developed and well-nourished.  HENT:  Head: Normocephalic and atraumatic.  Eyes: Pupils are equal, round, and reactive to light.  Neck: Normal range of motion.  Cardiovascular: Normal rate and regular rhythm.  Pulmonary/Chest: Effort normal. No stridor. No respiratory distress.  Abdominal: Bowel sounds are normal. She exhibits no distension. There is no tenderness.  Musculoskeletal: Normal range of motion. She exhibits no edema or deformity.  Neurological: She is alert and oriented to person, place, and time. No cranial nerve deficit. Coordination normal.  Nursing note and vitals reviewed.    ED Treatments / Results  Labs (all labs ordered are listed, but only abnormal results are displayed) Labs Reviewed  COMPREHENSIVE METABOLIC PANEL - Abnormal; Notable for the following components:      Result Value   Glucose, Bld 118 (*)    GFR calc non Af Amer 59 (*)    All other components within normal limits  CBC - Abnormal; Notable for the following components:   Hemoglobin 11.7 (*)    RDW 23.0 (*)    All other components within normal limits  LIPASE, BLOOD  URINALYSIS, ROUTINE W REFLEX MICROSCOPIC    EKG None  Radiology Ct Abdomen Pelvis W Contrast  Result Date: 05/03/2018 CLINICAL DATA:  Mid abdominal pain x1 month,  worse over the past 3 days. Patient had gastrointestinal study reports small blood vessel scheduled to be repaired next week. EXAM: CT ABDOMEN AND PELVIS WITH CONTRAST TECHNIQUE: Multidetector CT imaging of the abdomen and pelvis was performed using the standard protocol following bolus administration of intravenous contrast. Patient be noted that patient had extravasation contrast at the entire bolus into the right antecubital assessed by Dr. Stahl by report with Dr. Courteny Egler notified. Contrast extravasation report was filled in safety zone filled out per technologist report. CONTRAST:  <MEASUREMKentuckyENCaOlConsuellBufford Buttn<MEASUREMEKentuckyT>CaOlConsuellBufford Buttn<MEASUREMEKentuckyT>CaOlConsuellBufford Buttn<MEASUREMEKentuckyT>CaOlConsuellBufford Buttn<MEASUREMEKentuckyT>CaOlConsuellBufford Buttn<MEASUREMEKentuckyT>CaOlConsuellBufford Buttn<MEASUREMEKentuckyCBufford But<MEASUREMEKentuckyT>CaOlConsuellBufford Buttn<MEASUREMEKentuckyT>CaOlConsuellBufford Buttn<MEASUREMEKentuckyT>CaOlConsuellBufford Buttn<MEASUREMEKentuckyT>CaOlConsuellBufford Buttn<MEASUREMEKentuckyT>CaOlConsuellBufford Buttn<MEASUREMEKentuckyT>CaOlConsuellBufford Buttn<MEASUREMEKentuckyCBufford But<MEASUREMEKentuckyT>CaOlConsuellBufford Buttn<MEASUREMEKentuckyT>CaOlConsuellBufford Buttn<MEASUREMEKentuckyT>CaOlConsuellBufford Buttn<MEASUREMEKentuckyT>CaOlConsuellBufford Buttn<MEASUREMEKentuckyT>CaOlConsuellBufford ButtnDarlin DropnannerOPAMIDOL (ISOVUE-300) INJECTION 61% COMPARISON:  06/14/2014 FINDINGS: Lower chest: Top-normal heart size. Coronary arteriosclerosis is noted. No pericardial effusion or thickening. No pulmonary consolidations. Tiny tree-in-bud opacities at the right lung base may reflect a small focus of bronchiolitis. Hepatobiliary: Right hepatic granuloma. No biliary dilatation. Physiologic distention of the gallbladder without  stones. No definite space-occupying mass of the liver is identified given limitations due to suboptimal intravascular opacification from contrast due to the above-stated contrast extravasation. Pancreas: Atrophic pancreas without mass or ductal dilatation. No inflammation Spleen: Normal in size without focal abnormality. Adrenals/Urinary Tract: Normal bilateral adrenal glands. Bilateral nephrolithiasis with mild ectasia of the renal collecting systems bilaterally. Smaller right kidney noted as before. 13 mm cortically based hypodensity in the upper pole the left kidney consistent with a cyst is identified. The urinary bladder is unremarkable for the degree of distention. Stomach/Bowel: Small hiatal hernia. The stomach is partially opacified by enteric contrast. The duodenal sweep and ligament of Treitz are normal. Normal small bowel rotation is seen. No mechanical bowel obstruction. The distal and terminal  ileum are visualized with contrast noted within. A large amount of retained stool is seen throughout the colon without evidence of colitis or large bowel obstruction. Normal-appearing appendix. Vascular/Lymphatic: The native aorta is densely calcified as are the branch vessels. Renal artery stents are seen bilaterally. Aortobifemoral stent grafts are noted. Reproductive: Uterus and bilateral adnexa are unremarkable. Other: No abdominal wall hernia or abnormality. No abdominopelvic ascites. Musculoskeletal: Multilevel degenerative facet arthrosis of the lumbar spine. Assimilation joint at L5-S1 on right. For numbering purposes, the lowest square vertebral body will be labeled L5 and therefore there is L4-5 degenerative disc disease grade 1 anterolisthesis previously noted as L5-S1. AVN of the femoral heads without flattening again noted. IMPRESSION: Please note the patient experienced contrast extravasation of the entirety of the 100 cc bolus into the arm and was evaluated as above and within the patient's EMR and to be followed up as per protocol. 1. Moderate to marked stool retention throughout the colon without inflammation or obstruction. 2. Stable 13 mm cyst in the upper pole left kidney. No obstructive uropathy. Bilateral nephrolithiasis with mild ectasia of the renal collecting systems. 3. Dense aortoiliac atherosclerosis with aorto bi-iliac stent grafts place bilateral renal artery stents also noted. 4. L4-5 advanced degenerative disc disease with grade 1 anterolisthesis of L4 on L5. Assimilation joint at L5-S1 on the right. 5. 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 Electronically Signed   By: Tollie Eth M.D.   On: 05/03/2018 19:02    Procedures Procedures (including critical care time)  Medications Ordered in ED Medications  iopamidol (ISOVUE-300) 61 % injection 100 mL (100 mLs Intravenous Contrast Given 05/03/18 1724)     Initial Impression / Assessment and Plan / ED Course  I have  reviewed the triage vital signs and the nursing notes.  Pertinent labs & imaging results that were available during my care of the patient were reviewed by me and considered in my medical decision making (see chart for details).     Chronic abdominal pain. Ct w/o e/o perforation, gastritis/esophagitis. Hemoglobin stable. Will continue following up with Gi.   Final Clinical Impressions(s) / ED Diagnoses   Final diagnoses:  Constipation, unspecified constipation type    ED Discharge Orders    None       Avien Taha, Barbara Cower, MD 05/03/18 2339

## 2018-09-09 DIAGNOSIS — N3000 Acute cystitis without hematuria: Secondary | ICD-10-CM | POA: Insufficient documentation

## 2018-09-21 DIAGNOSIS — G3184 Mild cognitive impairment, so stated: Secondary | ICD-10-CM | POA: Insufficient documentation

## 2018-10-27 DIAGNOSIS — I83892 Varicose veins of left lower extremities with other complications: Secondary | ICD-10-CM | POA: Insufficient documentation

## 2018-12-06 ENCOUNTER — Ambulatory Visit: Payer: Medicare Other | Admitting: Neurology

## 2018-12-08 DIAGNOSIS — I872 Venous insufficiency (chronic) (peripheral): Secondary | ICD-10-CM | POA: Insufficient documentation

## 2018-12-20 ENCOUNTER — Ambulatory Visit: Payer: Medicare Other | Admitting: Neurology

## 2018-12-31 ENCOUNTER — Emergency Department (HOSPITAL_BASED_OUTPATIENT_CLINIC_OR_DEPARTMENT_OTHER): Payer: Medicare Other

## 2018-12-31 ENCOUNTER — Encounter (HOSPITAL_BASED_OUTPATIENT_CLINIC_OR_DEPARTMENT_OTHER): Payer: Self-pay | Admitting: Emergency Medicine

## 2018-12-31 ENCOUNTER — Emergency Department (HOSPITAL_BASED_OUTPATIENT_CLINIC_OR_DEPARTMENT_OTHER)
Admission: EM | Admit: 2018-12-31 | Discharge: 2018-12-31 | Disposition: A | Discharge status: Home / Self Care | Payer: Medicare Other | Attending: Emergency Medicine | Admitting: Emergency Medicine

## 2018-12-31 ENCOUNTER — Other Ambulatory Visit: Payer: Self-pay

## 2018-12-31 DIAGNOSIS — M069 Rheumatoid arthritis, unspecified: Secondary | ICD-10-CM | POA: Insufficient documentation

## 2018-12-31 DIAGNOSIS — I10 Essential (primary) hypertension: Secondary | ICD-10-CM | POA: Insufficient documentation

## 2018-12-31 DIAGNOSIS — Z888 Allergy status to other drugs, medicaments and biological substances status: Secondary | ICD-10-CM | POA: Diagnosis not present

## 2018-12-31 DIAGNOSIS — R350 Frequency of micturition: Secondary | ICD-10-CM | POA: Diagnosis present

## 2018-12-31 DIAGNOSIS — E782 Mixed hyperlipidemia: Secondary | ICD-10-CM | POA: Diagnosis not present

## 2018-12-31 DIAGNOSIS — Z91040 Latex allergy status: Secondary | ICD-10-CM | POA: Diagnosis not present

## 2018-12-31 DIAGNOSIS — Z79899 Other long term (current) drug therapy: Secondary | ICD-10-CM | POA: Diagnosis not present

## 2018-12-31 DIAGNOSIS — N3001 Acute cystitis with hematuria: Secondary | ICD-10-CM | POA: Diagnosis not present

## 2018-12-31 DIAGNOSIS — Z7982 Long term (current) use of aspirin: Secondary | ICD-10-CM | POA: Insufficient documentation

## 2018-12-31 DIAGNOSIS — Z87891 Personal history of nicotine dependence: Secondary | ICD-10-CM | POA: Insufficient documentation

## 2018-12-31 LAB — CBC WITH DIFFERENTIAL/PLATELET
Abs Immature Granulocytes: 0.03 10*3/uL (ref 0.00–0.07)
Basophils Absolute: 0 10*3/uL (ref 0.0–0.1)
Basophils Relative: 0 %
Eosinophils Absolute: 0 10*3/uL (ref 0.0–0.5)
Eosinophils Relative: 0 %
HCT: 37.8 % (ref 36.0–46.0)
Hemoglobin: 12 g/dL (ref 12.0–15.0)
Immature Granulocytes: 0 %
Lymphocytes Relative: 16 %
Lymphs Abs: 1.3 10*3/uL (ref 0.7–4.0)
MCH: 29.6 pg (ref 26.0–34.0)
MCHC: 31.7 g/dL (ref 30.0–36.0)
MCV: 93.3 fL (ref 80.0–100.0)
Monocytes Absolute: 0.8 10*3/uL (ref 0.1–1.0)
Monocytes Relative: 9 %
Neutro Abs: 6.4 10*3/uL (ref 1.7–7.7)
Neutrophils Relative %: 75 %
Platelets: 207 10*3/uL (ref 150–400)
RBC: 4.05 MIL/uL (ref 3.87–5.11)
RDW: 13.7 % (ref 11.5–15.5)
WBC: 8.5 10*3/uL (ref 4.0–10.5)
nRBC: 0 % (ref 0.0–0.2)

## 2018-12-31 LAB — URINALYSIS, ROUTINE W REFLEX MICROSCOPIC

## 2018-12-31 LAB — COMPREHENSIVE METABOLIC PANEL
ALT: 47 U/L — ABNORMAL HIGH (ref 0–44)
AST: 37 U/L (ref 15–41)
Albumin: 3.6 g/dL (ref 3.5–5.0)
Alkaline Phosphatase: 64 U/L (ref 38–126)
Anion gap: 10 (ref 5–15)
BUN: 15 mg/dL (ref 8–23)
CO2: 22 mmol/L (ref 22–32)
Calcium: 9.1 mg/dL (ref 8.9–10.3)
Chloride: 104 mmol/L (ref 98–111)
Creatinine, Ser: 0.82 mg/dL (ref 0.44–1.00)
GFR calc Af Amer: >60 mL/min (ref >60)
GFR calc non Af Amer: >60 mL/min (ref >60)
Glucose, Bld: 100 mg/dL — ABNORMAL HIGH (ref 70–99)
Potassium: 4 mmol/L (ref 3.5–5.1)
Sodium: 136 mmol/L (ref 135–145)
Total Bilirubin: 0.6 mg/dL (ref 0.3–1.2)
Total Protein: 7.2 g/dL (ref 6.5–8.1)

## 2018-12-31 LAB — URINALYSIS, MICROSCOPIC (REFLEX)
RBC / HPF: >50 RBC/hpf (ref 0–5)
WBC, UA: >50 WBC/hpf (ref 0–5)

## 2018-12-31 MED ORDER — IOHEXOL 300 MG/ML  SOLN
100.0000 mL | Freq: Once | INTRAMUSCULAR | Status: AC | PRN
  Administered 2018-12-31: 100 mL via INTRAVENOUS

## 2018-12-31 MED ORDER — PHENAZOPYRIDINE HCL 100 MG PO TABS: 95.0000 mg | ORAL_TABLET | Freq: Once | ORAL | Status: DC

## 2018-12-31 MED ORDER — CEPHALEXIN 500 MG PO CAPS: 500.0000 mg | ORAL_CAPSULE | Freq: Two times a day (BID) | ORAL | 0 refills | Status: AC

## 2018-12-31 MED ORDER — SODIUM CHLORIDE 0.9 % IV BOLUS
1000.0000 mL | Freq: Once | INTRAVENOUS | Status: AC
  Administered 2018-12-31: 1000 mL via INTRAVENOUS

## 2018-12-31 MED ORDER — OXYBUTYNIN CHLORIDE 5 MG PO TABS: 5.0000 mg | ORAL_TABLET | Freq: Two times a day (BID) | ORAL | 0 refills | Status: DC

## 2018-12-31 MED ORDER — PHENAZOPYRIDINE HCL 100 MG PO TABS
200.0000 mg | ORAL_TABLET | Freq: Once | ORAL | Status: AC
  Administered 2018-12-31: 100 mg via ORAL
  Filled 2018-12-31: qty 2

## 2018-12-31 MED ORDER — OXYBUTYNIN CHLORIDE ER 5 MG PO TB24: 5.0000 mg | ORAL_TABLET | Freq: Two times a day (BID) | ORAL | 0 refills | Status: DC

## 2018-12-31 MED ORDER — MORPHINE SULFATE (PF) 4 MG/ML IV SOLN
4.0000 mg | Freq: Once | INTRAVENOUS | Status: AC
  Administered 2018-12-31: 4 mg via INTRAVENOUS
  Filled 2018-12-31: qty 1

## 2018-12-31 MED ORDER — OXYBUTYNIN CHLORIDE 5 MG PO TABS
5.0000 mg | ORAL_TABLET | Freq: Two times a day (BID) | ORAL | Status: DC
  Filled 2018-12-31: qty 1

## 2018-12-31 NOTE — ED Notes (Signed)
Pt requesting to go to bathroom, urinary verified catheter in place.

## 2018-12-31 NOTE — Discharge Instructions (Addendum)
We have prescribed you antibiotics for UTI and oxybutynin for bladder spasm. Don't take the oxybutynin after midnight Sunday night in case your urologist would like to see you and attempt to remove cathter on Monday.

## 2018-12-31 NOTE — ED Provider Notes (Signed)
[facility] EMERGENCY DEPARTMENT Provider Note   CSN: [id number] Arrival date & time: 12/31/18  1059    History   Chief Complaint Chief Complaint  Patient presents with  . Urinary Frequency    HPI Sara Villanueva is a [age] y.o. female.     HPI   Last night had urinary frequency, felt like was going normally, then today has not urinated since 3AM. Feels like needs to. Went to the bathroom at 6AM but could only go a tiny bit.  Having severe bladder pain.\  No back pain. Has not started any new medicines.  Yesterday had Dr. Dorthy CoolerPrazinski-[facility]-had catheter/maybe cystoscopy. Urinated normally after the procedure.   No blood.  Is on medicine that makes urine orange for back.    No numbness or weaqkness, no increase back pain, no problems stooling, no fevers   Past Medical History:  Diagnosis Date  . Arthritis   . Carotid arterial disease (HCC)   . High cholesterol   . Hypertension   . Rheumatoid arteritis (HCC)   . Rheumatoid arthritis [facility](HCC)     Patient Active Problem List   Diagnosis Date Noted  . Knee pain, left 08/05/2015  . Allergic rhinitis 07/15/2015  . Carotid artery narrowing 07/15/2015  . Peripheral vascular disease (HCC) 07/15/2015  . Renal artery stenosis (HCC) 07/15/2015  . 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 Benign essential HTN 06/13/2015  . HLD (hyperlipidemia) 06/13/2015  . Rheumatoid arthritis (HCC) 06/13/2015  . Urgency of micturation 05/03/2015  . FOM (frequency of micturition) 05/03/2015  . Pedal edema 12/04/2014  . Dysesthesia 12/04/2014  . Nuclear sclerotic cataract 09/20/2014  . Lumbosacral radiculopathy 08/16/2014  . Spondylolisthesis at L5-S1 level 08/07/2014  . Neck pain 08/07/2014  . Chronic rheumatic arthritis (HCC) 08/07/2014  . Neuralgia neuritis, sciatic nerve 08/03/2014  . Aseptic bony necrosis (HCC) 08/03/2014  . Aseptic necrosis of bone (HCC) 08/03/2014    Past Surgical History:  Procedure Laterality  Date  . CAROTID ENDARTERECTOMY    . CORONARY ANGIOPLASTY WITH STENT PLACEMENT       OB History   No obstetric history on file.      Home Medications    Prior to Admission medications   Medication Sig Start Date End Date Taking? Authorizing Provider  solifenacin (VESICARE) 10 MG tablet Take 10 mg by mouth daily.   Yes [provider]  amLODipine-valsartan (EXFORGE) 5-320 MG tablet Take 0.5 tablets by mouth daily.  01/08/16 01/17/18  [provider]  aspirin 81 MG tablet Take 81 mg by mouth at bedtime.     [provider]  busPIRone (BUSPAR) 15 MG tablet Take 15 mg by mouth 3 (three) times daily.  08/01/14   [provider]  cephALEXin (KEFLEX) 500 MG capsule Take 1 capsule (500 mg total) by mouth 2 (two) times daily for 10 days. 12/31/18 01/10/19  Alvira MondaySchlossman, Tavish Gettis, MD  clopidogrel (PLAVIX) 75 MG tablet Take 75 mg by mouth daily.    [provider]  ezetimibe (ZETIA) 10 MG tablet Take 10 mg by mouth at bedtime.     [provider]  gabapentin (NEURONTIN) 800 MG tablet Take 1 tablet (800 mg total) by mouth 3 (three) times daily. 12/14/17   Sater, Pearletha Furlichard A, MD  hydrochlorothiazide (HYDRODIURIL) 25 MG tablet Take 1 tablet (25 mg total) by mouth daily. 07/14/15 04/02/16  Alvira MondaySchlossman, Lashandra Arauz, MD  meloxicam (MOBIC) 7.5 MG tablet Take 1 tablet (7.5 mg total) by mouth daily. 12/14/17   Despina AriasSater, Richard  A, MD  NITROSTAT 0.4 MG SL tablet Place 0.4 mg under the tongue every 5 (five) minutes as needed for chest pain.  07/08/14   [provider]  oxybutynin (DITROPAN) 5 MG tablet Take 1 tablet (5 mg total) by mouth 2 (two) times daily. 12/31/18   Gareth Morgan, MD  pravastatin (PRAVACHOL) 40 MG tablet Take 40 mg by mouth daily.    [provider]  trospium (SANCTURA) 20 MG tablet Take 20 mg by mouth at bedtime.    [provider]    Family History Family History  Problem Relation Age of Onset  . Heart disease Mother   . Heart disease  Father     Social History Social History   Tobacco Use  . Smoking status: Former Research scientist (life sciences)  . Smokeless tobacco: Never Used  Substance Use Topics  . Alcohol use: No    Alcohol/week: 0.0 standard drinks  . Drug use: No     Allergies   Nickel, Latex, and Lamictal [lamotrigine]   Review of Systems Review of Systems  Constitutional: Negative for fever.  HENT: Negative for sore throat.   Eyes: Negative for visual disturbance.  Respiratory: Negative for cough and shortness of breath.   Cardiovascular: Negative for chest pain.  Gastrointestinal: Positive for abdominal pain. Negative for constipation, diarrhea, nausea and vomiting.  Genitourinary: Positive for difficulty urinating.  Musculoskeletal: Negative for back pain and neck pain.  Skin: Negative for rash.  Neurological: Negative for syncope and headaches.     Physical Exam Updated Vital Signs BP 128/80   Pulse 92   Temp 98.1 F (36.7 C) (Oral)   Resp 20   SpO2 98%   Physical Exam Vitals signs and nursing note reviewed.  Constitutional:      General: She is not in acute distress.    Appearance: She is well-developed. She is not diaphoretic.  HENT:     Head: Normocephalic and atraumatic.  Eyes:     Conjunctiva/sclera: Conjunctivae normal.  Neck:     Musculoskeletal: Normal range of motion.  Cardiovascular:     Rate and Rhythm: Normal rate and regular rhythm.     Heart sounds: No murmur.  Pulmonary:     Effort: Pulmonary effort is normal. No respiratory distress.  Abdominal:     General: There is no distension.     Palpations: Abdomen is soft.     Tenderness: There is abdominal tenderness (suprapubic). There is no guarding.  Musculoskeletal:        General: No tenderness.  Skin:    General: Skin is warm and dry.     Findings: No erythema or rash.  Neurological:     Mental Status: She is alert and oriented to person, place, and time.      ED Treatments / Results  Labs (all labs ordered are listed,  but only abnormal results are displayed) Labs Reviewed  URINALYSIS, ROUTINE W REFLEX MICROSCOPIC - Abnormal; Notable for the following components:      Result Value   Color, Urine RED (*)    APPearance TURBID (*)    Glucose, UA   (*)    Value: TEST NOT REPORTED DUE TO COLOR INTERFERENCE OF URINE PIGMENT   Hgb urine dipstick   (*)    Value: TEST NOT REPORTED DUE TO COLOR INTERFERENCE OF URINE PIGMENT   Bilirubin Urine   (*)    Value: TEST NOT REPORTED DUE TO COLOR INTERFERENCE OF URINE PIGMENT   Ketones, ur   (*)  Value: TEST NOT REPORTED DUE TO COLOR INTERFERENCE OF URINE PIGMENT   Protein, ur   (*)    Value: TEST NOT REPORTED DUE TO COLOR INTERFERENCE OF URINE PIGMENT   Nitrite   (*)    Value: TEST NOT REPORTED DUE TO COLOR INTERFERENCE OF URINE PIGMENT   Leukocytes,Ua   (*)    Value: TEST NOT REPORTED DUE TO COLOR INTERFERENCE OF URINE PIGMENT   All other components within normal limits  URINALYSIS, MICROSCOPIC (REFLEX) - Abnormal; Notable for the following components:   Bacteria, UA MANY (*)    All other components within normal limits  COMPREHENSIVE METABOLIC PANEL - Abnormal; Notable for the following components:   Glucose, Bld 100 (*)    ALT 47 (*)    All other components within normal limits  URINE CULTURE  CBC WITH DIFFERENTIAL/PLATELET    EKG None  Radiology Ct Abdomen Pelvis W Contrast  Result Date: 12/31/2018 CLINICAL DATA:  Lower abdominal pain EXAM: CT ABDOMEN AND PELVIS WITH CONTRAST TECHNIQUE: Multidetector CT imaging of the abdomen and pelvis was performed using the standard protocol following bolus administration of intravenous contrast. CONTRAST:  OMNIPAQUE IOHEXOL 300 MG/ML  SOLN COMPARISON:  10/28/2018 FINDINGS: Lower chest: Minor basilar scarring. Hepatobiliary: Focal fatty infiltration of the liver along the falciform ligament anteriorly, image 18 series 2. No other significant hepatic abnormality or intrahepatic biliary dilatation. Hepatic and  portal veins are patent. Gallbladder unremarkable. Biliary system nondilated. Pancreas: Unremarkable. No pancreatic ductal dilatation or surrounding inflammatory changes. Spleen: Normal in size without focal abnormality. Adrenals/Urinary Tract: Normal adrenal glands. Stable hypodense renal cortical cysts measuring 1.4 cm or less in size. Right renal atrophy noted, chronic compatible with known renal vascular disease status post bilateral renal stents. Circumferential bladder wall thickening noted with mucosal enhancement. Foley catheter in the bladder. Air within the bladder lumen. Appearance suggest urinary tract infection or cystitis. Stomach/Bowel: Negative for bowel obstruction, significant dilatation, ileus, free air. No fluid collection, hemorrhage, hematoma, abscess, free fluid, ascites, acute inflammatory process. Scattered colonic diverticulosis without acute inflammation. Vascular/Lymphatic: Extensive abdominopelvic calcific atherosclerosis with bilateral renal stents and bilateral iliac stents present. No acute vascular process. No bulky adenopathy. Reproductive: Normal in size for age. No significant adnexal abnormality or mass. No pelvic free fluid. Other: Intact abdominal wall.  No hernia.  Negative for ascites. Musculoskeletal: Degenerative changes of the spine with lower lumbar facet arthropathy. Grade 1 anterolisthesis of L5 upon S1. S1 transitional vertebral anatomy noted. No compression fracture. IMPRESSION: Bladder wall thickening with mucosal enhancement and air in the lumen which may be related to the Foley catheter but suspect urinary tract infection or cystitis. No other acute intra-abdominal or pelvic finding. Extensive abdominopelvic atherosclerosis as above Diverticulosis without acute inflammatory process or abscess. Electronically Signed   By: Judie Petit.  Shick M.D.   On: 12/31/2018 14:30    Procedures Procedures (including critical care time)  Medications Ordered in ED Medications   morphine 4 MG/ML injection 4 mg (4 mg Intravenous Given 12/31/18 1236)  sodium chloride 0.9 % bolus 1,000 mL (0 mLs Intravenous Stopped 12/31/18 1517)  phenazopyridine (PYRIDIUM) tablet 200 mg (100 mg Oral Given 12/31/18 1348)  iohexol (OMNIPAQUE) 300 MG/ML solution 100 mL (100 mLs Intravenous Contrast Given 12/31/18 1358)     Initial Impression / Assessment and Plan / ED Course  I have reviewed the triage vital signs and the nursing notes.  Pertinent labs & imaging results that were available during my care of the patient were reviewed by  me and considered in my medical decision making (see chart for details).        73yo female with history above including history of overactive bladder with urologic procedure yesterday (likely cystoscopy, records not available) who presents with concern for difficulty urinating and suprapubic pain.  Given hx of retential and suprapubic fullness/pain, cathter placed with return of 300cc of urine.  Discussed with urology on call Dr. Pete Glatter who also does not have access to her urologist's records.  Continuing pain after catheterization, and suspect this discomfort secondary to bladder spasm, overactive bladder. On reeval patient with severe pain, ordered CT to evaluate for other abnormalities such as diverticulitis.    CT with bladder thickening without other abnormalities. Suspect symptoms secondary to UTI as well as bladder spasm.  Given rx for keflex, urine cx sent.  Given rx for oxybutinin.  Recommend call to follow up with her urologist Monday.  Final Clinical Impressions(s) / ED Diagnoses   Final diagnoses:  Acute cystitis with hematuria    ED Discharge Orders         Ordered    cephALEXin (KEFLEX) 500 MG capsule  2 times daily     12/31/18 1501    oxybutynin (DITROPAN XL) 5 MG 24 hr tablet  2 times daily,   Status:  Discontinued     12/31/18 1501    oxybutynin (DITROPAN) 5 MG tablet  2 times daily     12/31/18 1502           Alvira Monday,  MD 12/31/18 2212

## 2018-12-31 NOTE — ED Triage Notes (Signed)
Pt here with bladder pain and urinary frequency post procedure yesterday. It is unclear which procedure she had.

## 2018-12-31 NOTE — ED Notes (Signed)
Patient transported to CT 

## 2019-01-01 LAB — URINE CULTURE: Culture: NO GROWTH

## 2019-01-11 ENCOUNTER — Other Ambulatory Visit: Payer: Self-pay

## 2019-01-11 ENCOUNTER — Encounter (HOSPITAL_BASED_OUTPATIENT_CLINIC_OR_DEPARTMENT_OTHER): Payer: Self-pay

## 2019-01-11 ENCOUNTER — Emergency Department (HOSPITAL_BASED_OUTPATIENT_CLINIC_OR_DEPARTMENT_OTHER)
Admission: EM | Admit: 2019-01-11 | Discharge: 2019-01-12 | Disposition: A | Discharge status: Home / Self Care | Payer: Medicare Other | Attending: Emergency Medicine | Admitting: Emergency Medicine

## 2019-01-11 DIAGNOSIS — R319 Hematuria, unspecified: Secondary | ICD-10-CM | POA: Diagnosis present

## 2019-01-11 DIAGNOSIS — Z79899 Other long term (current) drug therapy: Secondary | ICD-10-CM | POA: Diagnosis not present

## 2019-01-11 DIAGNOSIS — Z7982 Long term (current) use of aspirin: Secondary | ICD-10-CM | POA: Insufficient documentation

## 2019-01-11 DIAGNOSIS — N3090 Cystitis, unspecified without hematuria: Secondary | ICD-10-CM

## 2019-01-11 DIAGNOSIS — I10 Essential (primary) hypertension: Secondary | ICD-10-CM | POA: Diagnosis not present

## 2019-01-11 DIAGNOSIS — M069 Rheumatoid arthritis, unspecified: Secondary | ICD-10-CM | POA: Insufficient documentation

## 2019-01-11 DIAGNOSIS — Z87891 Personal history of nicotine dependence: Secondary | ICD-10-CM | POA: Diagnosis not present

## 2019-01-11 DIAGNOSIS — N3001 Acute cystitis with hematuria: Secondary | ICD-10-CM | POA: Insufficient documentation

## 2019-01-11 HISTORY — DX: Bladder disorder, unspecified: N32.9

## 2019-01-11 LAB — URINALYSIS, ROUTINE W REFLEX MICROSCOPIC
Bilirubin Urine: NEGATIVE
Glucose, UA: NEGATIVE mg/dL
Ketones, ur: NEGATIVE mg/dL
Nitrite: NEGATIVE
Protein, ur: >300 mg/dL — AB
Specific Gravity, Urine: 1.02 (ref 1.005–1.030)
pH: 7.5 (ref 5.0–8.0)

## 2019-01-11 LAB — URINALYSIS, MICROSCOPIC (REFLEX): RBC / HPF: >50 RBC/hpf (ref 0–5)

## 2019-01-11 MED ORDER — OXYBUTYNIN CHLORIDE 5 MG PO TABS: 5.0000 mg | ORAL_TABLET | Freq: Two times a day (BID) | ORAL | 2 refills | Status: DC

## 2019-01-11 NOTE — Discharge Instructions (Addendum)
Follow-up with your urologist.  Make another appointment.  Urine today sent for culture.  We will actually not put you back on antibiotics since urine culture on 1 August was negative.  This may just be contamination or inflammatory changes in the bladder.  Have given you some bladder spasm medicine.  Return for any new or worse symptoms.

## 2019-01-11 NOTE — ED Triage Notes (Signed)
Per [facility]-pt self caths-~30 min after cath pt had blood in urine and lower abd pain-pt ambulatory from ambulance to ED WR-NAD-pt agrees to reports and adds that she has overactive bladder and self caths x 2/ day-NAD-steady gait

## 2019-01-11 NOTE — ED Provider Notes (Signed)
[facility] EMERGENCY DEPARTMENT Provider Note   CSN: [id number] Arrival date & time: 01/11/19  2000    History   Chief Complaint Chief Complaint  Patient presents with  . Hematuria    HPI Sara Villanueva is a 73 y.o. female.     Patient brought in by EMS.  Patient self caths and after catheterization had some blood in urine lower abdominal pain.  Patient was amatory from the ambulance.  Patient self caths because she has an overactive bladder.  She is followed by urology in the [facility] area.  Patient was seen for similar complaints August 1 was diagnosed with acute cystitis.  Started on Keflex antibiotic and urine culture was done.  The culture did not grow anything significantly.  Patient on Monday just a few days ago saw her urologist and they stated that her urine was not consistent with infection.  Patient states that the bladder spasm medicine did make her feel better.     Past Medical History:  Diagnosis Date  . Arthritis   . Bladder disorder   . Carotid arterial disease ([place])   . High cholesterol   . Hypertension   . Rheumatoid arteritis ([place])   . Rheumatoid arthritis [facility])     Patient Active Problem List   Diagnosis Date Noted  . Knee pain, left 08/05/2015  . Allergic rhinitis 07/15/2015  . Carotid artery narrowing 07/15/2015  . Peripheral vascular disease ([place]) 07/15/2015  . Renal artery stenosis ([place]) 07/15/2015  . 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 Benign essential HTN 06/13/2015  . HLD (hyperlipidemia) 06/13/2015  . Rheumatoid arthritis ([place]) 06/13/2015  . Urgency of micturation 05/03/2015  . FOM (frequency of micturition) 05/03/2015  . Pedal edema 12/04/2014  . Dysesthesia 12/04/2014  . Nuclear sclerotic cataract 09/20/2014  . Lumbosacral radiculopathy 08/16/2014  . Spondylolisthesis at L5-S1 level 08/07/2014  . Neck pain 08/07/2014  . Chronic rheumatic arthritis ([place]) 08/07/2014  . Neuralgia neuritis, sciatic nerve 08/03/2014  .  Aseptic bony necrosis ([place]) 08/03/2014  . Aseptic necrosis of bone ([place]) 08/03/2014    Past Surgical History:  Procedure Laterality Date  . CAROTID ENDARTERECTOMY    . CORONARY ANGIOPLASTY WITH STENT PLACEMENT       OB History   No obstetric history on file.      Home Medications    Prior to Admission medications   Medication Sig Start Date End Date Taking? Authorizing Provider  amLODipine-valsartan (EXFORGE) 5-320 MG tablet Take 0.5 tablets by mouth daily.  01/08/16 01/17/18  [provider]  aspirin 81 MG tablet Take 81 mg by mouth at bedtime.     [provider]  busPIRone (BUSPAR) 15 MG tablet Take 15 mg by mouth 3 (three) times daily.  08/01/14   [provider]  clopidogrel (PLAVIX) 75 MG tablet Take 75 mg by mouth daily.    [provider]  ezetimibe (ZETIA) 10 MG tablet Take 10 mg by mouth at bedtime.     [provider]  gabapentin (NEURONTIN) 800 MG tablet Take 1 tablet (800 mg total) by mouth 3 (three) times daily. 12/14/17   Sater, Nanine Means, MD  hydrochlorothiazide (HYDRODIURIL) 25 MG tablet Take 1 tablet (25 mg total) by mouth daily. 07/14/15 04/02/16  Gareth Morgan, MD  meloxicam (MOBIC) 7.5 MG tablet Take 1 tablet (7.5 mg total) by mouth daily. 12/14/17   Sater, Nanine Means, MD  NITROSTAT 0.4 MG SL tablet Place 0.4 mg under the tongue every 5 (five) minutes as  needed for chest pain.  07/08/14   [provider]  oxybutynin (DITROPAN) 5 MG tablet Take 1 tablet (5 mg total) by mouth 2 (two) times daily. 01/11/19   Vanetta Mulders, MD  pravastatin (PRAVACHOL) 40 MG tablet Take 40 mg by mouth daily.    [provider]  solifenacin (VESICARE) 10 MG tablet Take 10 mg by mouth daily.    [provider]  trospium (SANCTURA) 20 MG tablet Take 20 mg by mouth at bedtime.    [provider]    Family History Family History  Problem Relation Age of Onset  . Heart disease Mother   . Heart disease Father      Social History Social History   Tobacco Use  . Smoking status: Former Games developer  . Smokeless tobacco: Never Used  Substance Use Topics  . Alcohol use: No    Alcohol/week: 0.0 standard drinks  . Drug use: No     Allergies   Nickel, Latex, and Lamictal [lamotrigine]   Review of Systems Review of Systems  Constitutional: Negative for chills and fever.  HENT: Negative for rhinorrhea and sore throat.   Eyes: Negative for visual disturbance.  Respiratory: Negative for cough and shortness of breath.   Cardiovascular: Negative for chest pain and leg swelling.  Gastrointestinal: Negative for abdominal pain, diarrhea, nausea and vomiting.  Genitourinary: Positive for dysuria and hematuria.  Musculoskeletal: Negative for back pain and neck pain.  Skin: Negative for rash.  Neurological: Negative for dizziness, light-headedness and headaches.  Hematological: Does not bruise/bleed easily.  Psychiatric/Behavioral: Negative for confusion.     Physical Exam Updated Vital Signs BP (!) 148/72 (BP Location: Right Arm)   Pulse 97   Temp 98.3 F (36.8 C) (Oral)   Resp 18   Ht 1.727 m (5\' 8" )   Wt 64 kg   SpO2 98%   BMI 21.44 kg/m   Physical Exam Vitals signs and nursing note reviewed.  Constitutional:      General: She is not in acute distress.    Appearance: She is well-developed.  HENT:     Head: Normocephalic and atraumatic.  Eyes:     Extraocular Movements: Extraocular movements intact.     Conjunctiva/sclera: Conjunctivae normal.     Pupils: Pupils are equal, round, and reactive to light.  Neck:     Musculoskeletal: Normal range of motion and neck supple.  Cardiovascular:     Rate and Rhythm: Normal rate and regular rhythm.     Heart sounds: No murmur.  Pulmonary:     Effort: Pulmonary effort is normal. No respiratory distress.     Breath sounds: Normal breath sounds.  Abdominal:     Palpations: Abdomen is soft.     Tenderness: There is no abdominal tenderness.   Musculoskeletal: Normal range of motion.  Skin:    General: Skin is warm and dry.  Neurological:     General: No focal deficit present.     Mental Status: She is alert and oriented to person, place, and time.      ED Treatments / Results  Labs (all labs ordered are listed, but only abnormal results are displayed) Labs Reviewed  URINALYSIS, ROUTINE W REFLEX MICROSCOPIC - Abnormal; Notable for the following components:      Result Value   Color, Urine RED (*)    APPearance TURBID (*)    Hgb urine dipstick LARGE (*)    Protein, ur >300 (*)    Leukocytes,Ua MODERATE (*)  All other components within normal limits  URINALYSIS, MICROSCOPIC (REFLEX) - Abnormal; Notable for the following components:   Bacteria, UA FEW (*)    All other components within normal limits  URINE CULTURE    EKG EKG Interpretation  Date/Time:  Wednesday January 11 2019 23:58:59 EDT Ventricular Rate:  95 PR Interval:    QRS Duration: 98 QT Interval:  348 QTC Calculation: 438 R Axis:   77 Text Interpretation:  Sinus rhythm Biatrial enlargement LVH with secondary repolarization abnormality No significant change was found Confirmed by Vanetta MuldersZackowski, Crewe Heathman [phone]([id number]) on 01/12/2019 12:17:03 AM   Radiology No results found.  Procedures Procedures (including critical care time)  Medications Ordered in ED Medications - No data to display   Initial Impression / Assessment and Plan / ED Course  I have reviewed the triage vital signs and the nursing notes.  Pertinent labs & imaging results that were available during my care of the patient were reviewed by me and considered in my medical decision making (see chart for details).       Patient's main concern was the hematuria and a bladder spasm.  Patient urinalysis and culture on August 1 was suggestive of urinary tract infection but the culture did not grow anything.  Patient was treated with Keflex she thought it made her feel better but she also was treated  with Ditropan.  Which she felt helped a lot.  She saw her urologist on Monday.  Son then will hold off on starting her on a different antibiotic as this just may be contamination or just inflammation in the bladder.  We will send another culture have her follow-up with urologist.  It was some concern because her heart rate was 129 when she came in EKG showed that that had resolved it was a normal sinus rhythm and heart rate was in the 90s.  Did have some nonspecific ST segment depression laterally.  But no real significant change from old EKGs.  There was one in between had a lot of artifact and made that ST segment depression look worse than that was the earlier one tonight.  But the repeat looks good on top of that patient has no chest pain or any discomfort or shortness of breath.   Final Clinical Impressions(s) / ED Diagnoses   Final diagnoses:  Cystitis    ED Discharge Orders         Ordered    oxybutynin (DITROPAN) 5 MG tablet  2 times daily     01/11/19 2319           Vanetta MuldersZackowski, Naquan Garman, MD 01/12/19 0106

## 2019-01-12 LAB — URINE CULTURE: Culture: NO GROWTH

## 2019-01-18 ENCOUNTER — Encounter (HOSPITAL_BASED_OUTPATIENT_CLINIC_OR_DEPARTMENT_OTHER): Payer: Self-pay

## 2019-01-18 ENCOUNTER — Other Ambulatory Visit: Payer: Self-pay

## 2019-01-18 ENCOUNTER — Emergency Department (HOSPITAL_BASED_OUTPATIENT_CLINIC_OR_DEPARTMENT_OTHER)
Admission: EM | Admit: 2019-01-18 | Discharge: 2019-01-19 | Disposition: A | Discharge status: Home / Self Care | Payer: Medicare Other | Attending: Emergency Medicine | Admitting: Emergency Medicine

## 2019-01-18 ENCOUNTER — Ambulatory Visit: Payer: Medicare Other | Admitting: Neurology

## 2019-01-18 DIAGNOSIS — I10 Essential (primary) hypertension: Secondary | ICD-10-CM | POA: Insufficient documentation

## 2019-01-18 DIAGNOSIS — Z7982 Long term (current) use of aspirin: Secondary | ICD-10-CM | POA: Insufficient documentation

## 2019-01-18 DIAGNOSIS — N3000 Acute cystitis without hematuria: Secondary | ICD-10-CM | POA: Insufficient documentation

## 2019-01-18 DIAGNOSIS — R339 Retention of urine, unspecified: Secondary | ICD-10-CM | POA: Insufficient documentation

## 2019-01-18 DIAGNOSIS — Z91040 Latex allergy status: Secondary | ICD-10-CM | POA: Diagnosis not present

## 2019-01-18 DIAGNOSIS — Z79899 Other long term (current) drug therapy: Secondary | ICD-10-CM | POA: Insufficient documentation

## 2019-01-18 DIAGNOSIS — Z7902 Long term (current) use of antithrombotics/antiplatelets: Secondary | ICD-10-CM | POA: Diagnosis not present

## 2019-01-18 DIAGNOSIS — Z87891 Personal history of nicotine dependence: Secondary | ICD-10-CM | POA: Insufficient documentation

## 2019-01-18 DIAGNOSIS — R1030 Lower abdominal pain, unspecified: Secondary | ICD-10-CM | POA: Diagnosis present

## 2019-01-18 DIAGNOSIS — Z955 Presence of coronary angioplasty implant and graft: Secondary | ICD-10-CM | POA: Insufficient documentation

## 2019-01-18 LAB — URINALYSIS, MICROSCOPIC (REFLEX): WBC, UA: >50 WBC/hpf (ref 0–5)

## 2019-01-18 LAB — URINALYSIS, ROUTINE W REFLEX MICROSCOPIC
Bilirubin Urine: NEGATIVE
Glucose, UA: 100 mg/dL — AB
Ketones, ur: NEGATIVE mg/dL
Nitrite: POSITIVE — AB
Protein, ur: 100 mg/dL — AB
Specific Gravity, Urine: 1.01 (ref 1.005–1.030)
pH: 6.5 (ref 5.0–8.0)

## 2019-01-18 LAB — BASIC METABOLIC PANEL
Anion gap: 8 (ref 5–15)
BUN: 13 mg/dL (ref 8–23)
CO2: 22 mmol/L (ref 22–32)
Calcium: 9.4 mg/dL (ref 8.9–10.3)
Chloride: 105 mmol/L (ref 98–111)
Creatinine, Ser: 0.86 mg/dL (ref 0.44–1.00)
GFR calc Af Amer: >60 mL/min (ref >60)
GFR calc non Af Amer: >60 mL/min (ref >60)
Glucose, Bld: 111 mg/dL — ABNORMAL HIGH (ref 70–99)
Potassium: 3.9 mmol/L (ref 3.5–5.1)
Sodium: 135 mmol/L (ref 135–145)

## 2019-01-18 LAB — CBC WITH DIFFERENTIAL/PLATELET
Abs Immature Granulocytes: 0.01 10*3/uL (ref 0.00–0.07)
Basophils Absolute: 0 10*3/uL (ref 0.0–0.1)
Basophils Relative: 0 %
Eosinophils Absolute: 0.1 10*3/uL (ref 0.0–0.5)
Eosinophils Relative: 1 %
HCT: 37.6 % (ref 36.0–46.0)
Hemoglobin: 11.7 g/dL — ABNORMAL LOW (ref 12.0–15.0)
Immature Granulocytes: 0 %
Lymphocytes Relative: 20 %
Lymphs Abs: 1.4 10*3/uL (ref 0.7–4.0)
MCH: 28.5 pg (ref 26.0–34.0)
MCHC: 31.1 g/dL (ref 30.0–36.0)
MCV: 91.5 fL (ref 80.0–100.0)
Monocytes Absolute: 1.1 10*3/uL — ABNORMAL HIGH (ref 0.1–1.0)
Monocytes Relative: 16 %
Neutro Abs: 4.2 10*3/uL (ref 1.7–7.7)
Neutrophils Relative %: 63 %
Platelets: 243 10*3/uL (ref 150–400)
RBC: 4.11 MIL/uL (ref 3.87–5.11)
RDW: 14.1 % (ref 11.5–15.5)
WBC: 6.8 10*3/uL (ref 4.0–10.5)
nRBC: 0 % (ref 0.0–0.2)

## 2019-01-18 MED ORDER — ACETAMINOPHEN 325 MG PO TABS
650.0000 mg | ORAL_TABLET | Freq: Once | ORAL | Status: AC
  Administered 2019-01-18: 650 mg via ORAL
  Filled 2019-01-18: qty 2

## 2019-01-18 MED ORDER — FENTANYL CITRATE (PF) 100 MCG/2ML IJ SOLN
50.0000 ug | INTRAMUSCULAR | Status: DC | PRN
  Administered 2019-01-18: 50 ug via INTRAVENOUS

## 2019-01-18 MED ORDER — FENTANYL CITRATE (PF) 100 MCG/2ML IJ SOLN
INTRAMUSCULAR | Status: AC
  Filled 2019-01-18: qty 2

## 2019-01-18 MED ORDER — TAMSULOSIN HCL 0.4 MG PO CAPS
0.4000 mg | ORAL_CAPSULE | ORAL | Status: AC
  Administered 2019-01-19: 0.4 mg via ORAL
  Filled 2019-01-18: qty 1

## 2019-01-18 MED ORDER — KETOROLAC TROMETHAMINE 30 MG/ML IJ SOLN
15.0000 mg | Freq: Once | INTRAMUSCULAR | Status: AC
  Administered 2019-01-19: 15 mg via INTRAVENOUS
  Filled 2019-01-18: qty 1

## 2019-01-18 MED ORDER — PHENAZOPYRIDINE HCL 100 MG PO TABS
200.0000 mg | ORAL_TABLET | Freq: Once | ORAL | Status: AC
  Administered 2019-01-19: 200 mg via ORAL
  Filled 2019-01-18: qty 2

## 2019-01-18 MED ORDER — SODIUM CHLORIDE 0.9 % IV SOLN
1.0000 g | Freq: Once | INTRAVENOUS | Status: AC
  Administered 2019-01-19: 1 g via INTRAVENOUS
  Filled 2019-01-18: qty 10

## 2019-01-18 NOTE — ED Triage Notes (Signed)
Pt c/o lower abd pain/pelvic pain, dysuria started today-pt with hx of self catheterized but states she has since changed doctors and does not self cath any more-to triage in w/c

## 2019-01-19 ENCOUNTER — Encounter (HOSPITAL_BASED_OUTPATIENT_CLINIC_OR_DEPARTMENT_OTHER): Payer: Self-pay | Admitting: Emergency Medicine

## 2019-01-19 DIAGNOSIS — N3000 Acute cystitis without hematuria: Secondary | ICD-10-CM | POA: Diagnosis not present

## 2019-01-19 MED ORDER — SODIUM CHLORIDE 0.9 % IV SOLN
INTRAVENOUS | Status: DC | PRN
  Administered 2019-01-19: via INTRAVENOUS

## 2019-01-19 MED ORDER — CEPHALEXIN 500 MG PO CAPS: 500.0000 mg | ORAL_CAPSULE | Freq: Two times a day (BID) | ORAL | 0 refills | Status: DC

## 2019-01-19 MED ORDER — TAMSULOSIN HCL 0.4 MG PO CAPS: 0.4000 mg | ORAL_CAPSULE | Freq: Every day | ORAL | 0 refills | Status: DC

## 2019-01-19 MED ORDER — PHENAZOPYRIDINE HCL 200 MG PO TABS: 200.0000 mg | ORAL_TABLET | Freq: Three times a day (TID) | ORAL | 0 refills | Status: DC

## 2019-01-19 NOTE — ED Notes (Signed)
Pt continues to be tearful and reporting pain 10/10. Pt placed on cardiac monitor. EKG done due to tachycardia

## 2019-01-19 NOTE — ED Notes (Signed)
Daughter at bedside.

## 2019-01-19 NOTE — ED Provider Notes (Signed)
[facility] EMERGENCY DEPARTMENT Provider Note   CSN: [id number] Arrival date & time: 01/18/19  2242     History   Chief Complaint Chief Complaint  Patient presents with   Abdominal Pain    HPI Sara Villanueva is a 73 y.o. female.     The history is provided by the patient.  Abdominal Pain Pain location:  Suprapubic Pain quality: sharp   Pain radiates to:  Does not radiate Pain severity:  Severe Onset quality:  Gradual Duration:  12 weeks Timing:  Constant Progression:  Waxing and waning Chronicity:  Chronic Context: not alcohol use and not medication withdrawal   Relieved by:  Nothing Worsened by:  Nothing Ineffective treatments:  None tried Associated symptoms: dysuria   Associated symptoms: no anorexia, no belching, no chest pain, no chills, no constipation, no cough, no diarrhea, no fatigue, no fever, no flatus, no hematemesis, no hematochezia, no hematuria, no melena, no nausea, no shortness of breath, no sore throat, no vaginal bleeding, no vaginal discharge and no vomiting   Risk factors: being elderly   Patient with bladder spasms seeing urology and has had cystoscopy x 2. Presents with pain and distention.  No f/c/r.  No n/v/d.    Past Medical History:  Diagnosis Date   Arthritis    Bladder disorder    Carotid arterial disease ([facility])    High cholesterol    Hypertension    Rheumatoid arteritis ([facility])    Rheumatoid arthritis [facility])     Patient Active Problem List   Diagnosis Date Noted   Knee pain, left 08/05/2015   Allergic rhinitis 07/15/2015   Carotid artery narrowing 07/15/2015   Peripheral vascular disease ([facility]) 07/15/2015   Renal artery stenosis ([facility]) 07/15/2015   Arterial vascular disease 06/13/2015   Benign essential HTN 06/13/2015   HLD (hyperlipidemia) 06/13/2015   Rheumatoid arthritis ([facility]) 06/13/2015   Urgency of micturation 05/03/2015   FOM (frequency of micturition) 05/03/2015   Pedal edema 12/04/2014    Dysesthesia 12/04/2014   Nuclear sclerotic cataract 09/20/2014   Lumbosacral radiculopathy 08/16/2014   Spondylolisthesis at L5-S1 level 08/07/2014   Neck pain 08/07/2014   Chronic rheumatic arthritis ([facility]) 08/07/2014   Neuralgia neuritis, sciatic nerve 08/03/2014   Aseptic bony necrosis ([facility]) 08/03/2014   Aseptic necrosis of bone ([facility]) 08/03/2014    Past Surgical History:  Procedure Laterality Date   CAROTID ENDARTERECTOMY     CORONARY ANGIOPLASTY WITH STENT PLACEMENT       OB History   No obstetric history on file.      Home Medications    Prior to Admission medications   Medication Sig Start Date End Date Taking? Authorizing Provider  amLODipine-valsartan (EXFORGE) 5-320 MG tablet Take 0.5 tablets by mouth daily.  01/08/16 01/17/18  [provider]  aspirin 81 MG tablet Take 81 mg by mouth at bedtime.     [provider]  busPIRone (BUSPAR) 15 MG tablet Take 15 mg by mouth 3 (three) times daily.  08/01/14   [provider]  clopidogrel (PLAVIX) 75 MG tablet Take 75 mg by mouth daily.    [provider]  ezetimibe (ZETIA) 10 MG tablet Take 10 mg by mouth at bedtime.     [provider]  gabapentin (NEURONTIN) 800 MG tablet Take 1 tablet (800 mg total) by mouth 3 (three) times daily. 12/14/17   Sater, Pearletha Furl, MD  hydrochlorothiazide (HYDRODIURIL) 25 MG tablet Take 1 tablet (25 mg total) by mouth daily. 07/14/15 04/02/16  Alvira MondaySchlossman, Erin, MD  meloxicam (MOBIC) 7.5 MG tablet Take 1 tablet (7.5 mg total) by mouth daily. 12/14/17   Sater, Pearletha Furlichard A, MD  NITROSTAT 0.4 MG SL tablet Place 0.4 mg under the tongue every 5 (five) minutes as needed for chest pain.  07/08/14   [provider]  oxybutynin (DITROPAN) 5 MG tablet Take 1 tablet (5 mg total) by mouth 2 (two) times daily. 01/11/19   Vanetta MuldersZackowski, Scott, MD  pravastatin (PRAVACHOL) 40 MG tablet Take 40 mg by mouth daily.    [provider]  solifenacin (VESICARE) 10 MG  tablet Take 10 mg by mouth daily.    [provider]  trospium (SANCTURA) 20 MG tablet Take 20 mg by mouth at bedtime.    [provider]    Family History Family History  Problem Relation Age of Onset   Heart disease Mother    Heart disease Father     Social History Social History   Tobacco Use   Smoking status: Former Smoker   Smokeless tobacco: Never Used  Substance Use Topics   Alcohol use: No    Alcohol/week: 0.0 standard drinks   Drug use: No     Allergies   Nickel, Latex, and Lamictal [lamotrigine]   Review of Systems Review of Systems  Constitutional: Negative for chills, fatigue and fever.  HENT: Negative for sore throat.   Eyes: Negative for visual disturbance.  Respiratory: Negative for cough and shortness of breath.   Cardiovascular: Negative for chest pain.  Gastrointestinal: Positive for abdominal distention and abdominal pain. Negative for anorexia, constipation, diarrhea, flatus, hematemesis, hematochezia, melena, nausea and vomiting.  Genitourinary: Positive for dysuria and frequency. Negative for hematuria, vaginal bleeding and vaginal discharge.  Neurological: Negative for light-headedness.  Psychiatric/Behavioral: Negative for agitation.  All other systems reviewed and are negative.    Physical Exam Updated Vital Signs BP (!) 167/119    Pulse (!) 103    Temp 99.2 F (37.3 C) (Oral)    Resp 18    SpO2 99%   Physical Exam Vitals signs and nursing note reviewed.  Constitutional:      General: She is not in acute distress.    Appearance: She is normal weight.  HENT:     Head: Normocephalic and atraumatic.     Nose: Nose normal.  Eyes:     Conjunctiva/sclera: Conjunctivae normal.     Pupils: Pupils are equal, round, and reactive to light.  Neck:     Musculoskeletal: Normal range of motion and neck supple.  Cardiovascular:     Rate and Rhythm: Regular rhythm. Tachycardia present.     Pulses: Normal pulses.     Heart  sounds: Normal heart sounds.  Pulmonary:     Effort: Pulmonary effort is normal.     Breath sounds: Normal breath sounds.  Abdominal:     General: Abdomen is flat. Bowel sounds are normal.     Tenderness: There is no abdominal tenderness. There is no guarding or rebound.  Musculoskeletal: Normal range of motion.  Skin:    General: Skin is warm and dry.     Capillary Refill: Capillary refill takes less than 2 seconds.  Neurological:     General: No focal deficit present.     Mental Status: She is alert and oriented to person, place, and time.  Psychiatric:        Mood and Affect: Mood normal.        Behavior: Behavior normal.  ED Treatments / Results  Labs (all labs ordered are listed, but only abnormal results are displayed) Results for orders placed or performed during the hospital encounter of 01/18/19  Urinalysis, Routine w reflex microscopic- may I&O cath if menses  Result Value Ref Range   Color, Urine ORANGE (A) YELLOW   APPearance CLOUDY (A) CLEAR   Specific Gravity, Urine 1.010 1.005 - 1.030   pH 6.5 5.0 - 8.0   Glucose, UA 100 (A) NEGATIVE mg/dL   Hgb urine dipstick SMALL (A) NEGATIVE   Bilirubin Urine NEGATIVE NEGATIVE   Ketones, ur NEGATIVE NEGATIVE mg/dL   Protein, ur 253 (A) NEGATIVE mg/dL   Nitrite POSITIVE (A) NEGATIVE   Leukocytes,Ua LARGE (A) NEGATIVE  CBC with Differential/Platelet  Result Value Ref Range   WBC 6.8 4.0 - 10.5 K/uL   RBC 4.11 3.87 - 5.11 MIL/uL   Hemoglobin 11.7 (L) 12.0 - 15.0 g/dL   HCT 66.4 40.3 - 47.4 %   MCV 91.5 80.0 - 100.0 fL   MCH 28.5 26.0 - 34.0 pg   MCHC 31.1 30.0 - 36.0 g/dL   RDW 25.9 56.3 - 87.5 %   Platelets 243 150 - 400 K/uL   nRBC 0.0 0.0 - 0.2 %   Neutrophils Relative % 63 %   Neutro Abs 4.2 1.7 - 7.7 K/uL   Lymphocytes Relative 20 %   Lymphs Abs 1.4 0.7 - 4.0 K/uL   Monocytes Relative 16 %   Monocytes Absolute 1.1 (H) 0.1 - 1.0 K/uL   Eosinophils Relative 1 %   Eosinophils Absolute 0.1 0.0 - 0.5 K/uL     Basophils Relative 0 %   Basophils Absolute 0.0 0.0 - 0.1 K/uL   Immature Granulocytes 0 %   Abs Immature Granulocytes 0.01 0.00 - 0.07 K/uL  Basic metabolic panel  Result Value Ref Range   Sodium 135 135 - 145 mmol/L   Potassium 3.9 3.5 - 5.1 mmol/L   Chloride 105 98 - 111 mmol/L   CO2 22 22 - 32 mmol/L   Glucose, Bld 111 (H) 70 - 99 mg/dL   BUN 13 8 - 23 mg/dL   Creatinine, Ser 6.43 0.44 - 1.00 mg/dL   Calcium 9.4 8.9 - 32.9 mg/dL   GFR calc non Af Amer >60 >60 mL/min   GFR calc Af Amer >60 >60 mL/min   Anion gap 8 5 - 15  Urinalysis, Microscopic (reflex)  Result Value Ref Range   RBC / HPF 0-5 0 - 5 RBC/hpf   WBC, UA >50 0 - 5 WBC/hpf   Bacteria, UA MANY (A) NONE SEEN   Squamous Epithelial / LPF 6-10 0 - 5   Ct Abdomen Pelvis W Contrast  Result Date: 12/31/2018 CLINICAL DATA:  Lower abdominal pain EXAM: CT ABDOMEN AND PELVIS WITH CONTRAST TECHNIQUE: Multidetector CT imaging of the abdomen and pelvis was performed using the standard protocol following bolus administration of intravenous contrast. CONTRAST:  OMNIPAQUE IOHEXOL 300 MG/ML  SOLN COMPARISON:  10/28/2018 FINDINGS: Lower chest: Minor basilar scarring. Hepatobiliary: Focal fatty infiltration of the liver along the falciform ligament anteriorly, image 18 series 2. No other significant hepatic abnormality or intrahepatic biliary dilatation. Hepatic and portal veins are patent. Gallbladder unremarkable. Biliary system nondilated. Pancreas: Unremarkable. No pancreatic ductal dilatation or surrounding inflammatory changes. Spleen: Normal in size without focal abnormality. Adrenals/Urinary Tract: Normal adrenal glands. Stable hypodense renal cortical cysts measuring 1.4 cm or less in size. Right renal atrophy noted, chronic compatible with known renal vascular  disease status post bilateral renal stents. Circumferential bladder wall thickening noted with mucosal enhancement. Foley catheter in the bladder. Air within the bladder  lumen. Appearance suggest urinary tract infection or cystitis. Stomach/Bowel: Negative for bowel obstruction, significant dilatation, ileus, free air. No fluid collection, hemorrhage, hematoma, abscess, free fluid, ascites, acute inflammatory process. Scattered colonic diverticulosis without acute inflammation. Vascular/Lymphatic: Extensive abdominopelvic calcific atherosclerosis with bilateral renal stents and bilateral iliac stents present. No acute vascular process. No bulky adenopathy. Reproductive: Normal in size for age. No significant adnexal abnormality or mass. No pelvic free fluid. Other: Intact abdominal wall.  No hernia.  Negative for ascites. Musculoskeletal: Degenerative changes of the spine with lower lumbar facet arthropathy. Grade 1 anterolisthesis of L5 upon S1. S1 transitional vertebral anatomy noted. No compression fracture. IMPRESSION: Bladder wall thickening with mucosal enhancement and air in the lumen which may be related to the Foley catheter but suspect urinary tract infection or cystitis. No other acute intra-abdominal or pelvic finding. Extensive abdominopelvic atherosclerosis as above Diverticulosis without acute inflammatory process or abscess. Electronically Signed   By: Judie PetitM.  Shick M.D.   On: 12/31/2018 14:30    EKG EKG Interpretation  Date/Time:  Wednesday January 18 2019 23:58:16 EDT Ventricular Rate:  132 PR Interval:    QRS Duration: 95 QT Interval:  310 QTC Calculation: 460 R Axis:   76 Text Interpretation:  Sinus tachycardia LAE, consider biatrial enlargement LVH with secondary repolarization abnormality Baseline wander in lead(s) II Confirmed by Nicanor AlconPalumbo, Starlene Consuegra ([id number]) on 01/19/2019 12:14:27 AM   Radiology No results found.  Procedures Procedures (including critical care time)  Medications Ordered in ED Medications  fentaNYL (SUBLIMAZE) injection 50 mcg (50 mcg Intravenous Given 01/18/19 2351)  0.9 %  sodium chloride infusion ( Intravenous Stopped 01/19/19  0100)  acetaminophen (TYLENOL) tablet 650 mg (650 mg Oral Given 01/18/19 2339)  cefTRIAXone (ROCEPHIN) 1 g in sodium chloride 0.9 % 100 mL IVPB (0 g Intravenous Stopped 01/19/19 0100)  ketorolac (TORADOL) 30 MG/ML injection 15 mg (15 mg Intravenous Given 01/19/19 0005)  tamsulosin (FLOMAX) capsule 0.4 mg (0.4 mg Oral Given 01/19/19 0004)  phenazopyridine (PYRIDIUM) tablet 200 mg (200 mg Oral Given 01/19/19 0004)     The patient is not septic.  She has been tachycardiac on every ED visit secondary to pain.  This is improved. Foley is in place and draining.  This will be removed by urology in the office in 7 days time.  She was given IV ABX in the ED and is feeling markedly improved.  She has been given strict return precautions.  She is to take all 10 days of antibiotics.  Will start Keflex and flomax   Final Clinical Impressions(s) / ED Diagnoses   Return for intractable cough, coughing up blood,fevers >100.4 unrelieved by medication, shortness of breath, intractable vomiting, chest pain, shortness of breath, weakness,numbness, changes in speech, facial asymmetry,abdominal pain, passing out,Inability to tolerate liquids or food, cough, altered mental status or any concerns. No signs of systemic illness or infection. The patient is nontoxic-appearing on exam and vital signs are within normal limits.   I have reviewed the triage vital signs and the nursing notes. Pertinent labs &imaging results that were available during my care of the patient were reviewed by me and considered in my medical decision making (see chart for details).  After history, exam, and medical workup I feel the patient has been appropriately medically screened and is safe for discharge home. Pertinent diagnoses were discussed with the patient. Patient  was given return precautions   Latissa Frick, MD 01/19/19 0813

## 2019-01-19 NOTE — ED Notes (Signed)
Pt pain worse since foley placed- pt tearful. EDP informed and verbal order for tylenol given.

## 2019-01-20 LAB — URINE CULTURE
Culture: NO GROWTH
Special Requests: NORMAL

## 2019-01-26 ENCOUNTER — Other Ambulatory Visit: Payer: Self-pay | Admitting: Neurology

## 2019-02-14 DIAGNOSIS — N398 Other specified disorders of urinary system: Secondary | ICD-10-CM | POA: Insufficient documentation

## 2019-02-20 ENCOUNTER — Emergency Department (HOSPITAL_BASED_OUTPATIENT_CLINIC_OR_DEPARTMENT_OTHER)
Admission: EM | Admit: 2019-02-20 | Discharge: 2019-02-21 | Disposition: A | Discharge status: Home / Self Care | Payer: Medicare Other | Attending: Emergency Medicine | Admitting: Emergency Medicine

## 2019-02-20 ENCOUNTER — Encounter (HOSPITAL_BASED_OUTPATIENT_CLINIC_OR_DEPARTMENT_OTHER): Payer: Self-pay | Admitting: Adult Health

## 2019-02-20 ENCOUNTER — Other Ambulatory Visit: Payer: Self-pay

## 2019-02-20 DIAGNOSIS — N3000 Acute cystitis without hematuria: Secondary | ICD-10-CM | POA: Insufficient documentation

## 2019-02-20 DIAGNOSIS — I10 Essential (primary) hypertension: Secondary | ICD-10-CM | POA: Insufficient documentation

## 2019-02-20 DIAGNOSIS — Z87891 Personal history of nicotine dependence: Secondary | ICD-10-CM | POA: Insufficient documentation

## 2019-02-20 DIAGNOSIS — E785 Hyperlipidemia, unspecified: Secondary | ICD-10-CM | POA: Insufficient documentation

## 2019-02-20 DIAGNOSIS — Z79899 Other long term (current) drug therapy: Secondary | ICD-10-CM | POA: Insufficient documentation

## 2019-02-20 DIAGNOSIS — M069 Rheumatoid arthritis, unspecified: Secondary | ICD-10-CM | POA: Insufficient documentation

## 2019-02-20 DIAGNOSIS — R102 Pelvic and perineal pain: Secondary | ICD-10-CM | POA: Diagnosis present

## 2019-02-20 DIAGNOSIS — Z888 Allergy status to other drugs, medicaments and biological substances status: Secondary | ICD-10-CM | POA: Insufficient documentation

## 2019-02-20 DIAGNOSIS — Z91040 Latex allergy status: Secondary | ICD-10-CM | POA: Insufficient documentation

## 2019-02-20 DIAGNOSIS — Z7982 Long term (current) use of aspirin: Secondary | ICD-10-CM | POA: Diagnosis not present

## 2019-02-20 NOTE — ED Notes (Signed)
Patient reports self catheterization at home; recurrent UTI's reported; started most recent antibiotic on Friday (Macrobid).

## 2019-02-20 NOTE — ED Triage Notes (Signed)
Presents with lower abdominal pain that began Friday. Pt was seen at her urologist on Friday and told to self catheterize 2 times per day. She has been doing this. She was also told she has a UTI and she was placed on Macrobid. However her abdominal pain is no better.

## 2019-02-20 NOTE — ED Provider Notes (Signed)
[facility] Provider Note   CSN: [id number] Arrival date & time: 02/20/19  2137     History   Chief Complaint Chief Complaint  Patient presents with  . Abdominal Pain    HPI Sara Villanueva is a 73 y.o. female.     The history is provided by the patient, a relative and medical records. No language interpreter was used.  Dysuria Pain quality:  Aching Pain severity:  Moderate Onset quality:  Gradual Duration:  1 week Timing:  Constant Progression:  Unchanged Chronicity:  Recurrent Recent urinary tract infections: yes   Relieved by:  Nothing Worsened by:  Nothing Ineffective treatments:  Antibiotics Urinary symptoms: frequent urination and hesitancy   Associated symptoms: abdominal pain   Associated symptoms: no fever, no flank pain, no genital lesions, no nausea, no vaginal discharge and no vomiting   Risk factors: recurrent urinary tract infections and urinary catheter use     Past Medical History:  Diagnosis Date  . Arthritis   . Bladder disorder   . Carotid arterial disease ([place])   . High cholesterol   . Hypertension   . Rheumatoid arteritis ([place])   . Rheumatoid arthritis [facility])     Patient Active Problem List   Diagnosis Date Noted  . Knee pain, left 08/05/2015  . Allergic rhinitis 07/15/2015  . Carotid artery narrowing 07/15/2015  . Peripheral vascular disease ([place]) 07/15/2015  . Renal artery stenosis ([place]) 07/15/2015  . 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 Benign essential HTN 06/13/2015  . HLD (hyperlipidemia) 06/13/2015  . Rheumatoid arthritis ([place]) 06/13/2015  . Urgency of micturation 05/03/2015  . FOM (frequency of micturition) 05/03/2015  . Pedal edema 12/04/2014  . Dysesthesia 12/04/2014  . Nuclear sclerotic cataract 09/20/2014  . Lumbosacral radiculopathy 08/16/2014  . Spondylolisthesis at L5-S1 level 08/07/2014  . Neck pain 08/07/2014  . Chronic rheumatic arthritis ([place]) 08/07/2014  . Neuralgia neuritis,  sciatic nerve 08/03/2014  . Aseptic bony necrosis ([place]) 08/03/2014  . Aseptic necrosis of bone ([place]) 08/03/2014    Past Surgical History:  Procedure Laterality Date  . CAROTID ENDARTERECTOMY    . CORONARY ANGIOPLASTY WITH STENT PLACEMENT       OB History   No obstetric history on file.      Home Medications    Prior to Admission medications   Medication Sig Start Date End Date Taking? Authorizing Provider  amLODipine-valsartan (EXFORGE) 5-320 MG tablet Take 0.5 tablets by mouth daily.  01/08/16 01/17/18  [provider]  aspirin 81 MG tablet Take 81 mg by mouth at bedtime.     [provider]  busPIRone (BUSPAR) 15 MG tablet Take 15 mg by mouth 3 (three) times daily.  08/01/14   [provider]  cephALEXin (KEFLEX) 500 MG capsule Take 1 capsule (500 mg total) by mouth 2 (two) times daily. 01/19/19   Palumbo, April, MD  clopidogrel (PLAVIX) 75 MG tablet Take 75 mg by mouth daily.    [provider]  ezetimibe (ZETIA) 10 MG tablet Take 10 mg by mouth at bedtime.     [provider]  gabapentin (NEURONTIN) 800 MG tablet TAKE 1 TABLET BY MOUTH THREE TIMES DAILY 01/30/19   Sater, Nanine Means, MD  hydrochlorothiazide (HYDRODIURIL) 25 MG tablet Take 1 tablet (25 mg total) by mouth daily. 07/14/15 04/02/16  Gareth Morgan, MD  meloxicam (MOBIC) 7.5 MG tablet Take 1 tablet (7.5 mg total) by mouth daily. 12/14/17   Sater, Nanine Means, MD  NITROSTAT 0.4 MG  SL tablet Place 0.4 mg under the tongue every 5 (five) minutes as needed for chest pain.  07/08/14   [provider]  oxybutynin (DITROPAN) 5 MG tablet Take 1 tablet (5 mg total) by mouth 2 (two) times daily. 01/11/19   Vanetta MuldersZackowski, Scott, MD  phenazopyridine (PYRIDIUM) 200 MG tablet Take 1 tablet (200 mg total) by mouth 3 (three) times daily. 01/19/19   Palumbo, April, MD  pravastatin (PRAVACHOL) 40 MG tablet Take 40 mg by mouth daily.    [provider]  solifenacin (VESICARE) 10 MG tablet Take  10 mg by mouth daily.    [provider]  tamsulosin (FLOMAX) 0.4 MG CAPS capsule Take 1 capsule (0.4 mg total) by mouth daily. 01/19/19   Palumbo, April, MD  trospium (SANCTURA) 20 MG tablet Take 20 mg by mouth at bedtime.    [provider]    Family History Family History  Problem Relation Age of Onset  . Heart disease Mother   . Heart disease Father     Social History Social History   Tobacco Use  . Smoking status: Former Games developermoker  . Smokeless tobacco: Never Used  Substance Use Topics  . Alcohol use: No    Alcohol/week: 0.0 standard drinks  . Drug use: No     Allergies   Nickel, Latex, and Lamictal [lamotrigine]   Review of Systems Review of Systems  Constitutional: Negative for chills, diaphoresis, fatigue and fever.  HENT: Negative for congestion.   Respiratory: Negative for cough, chest tightness, shortness of breath and wheezing.   Cardiovascular: Negative for chest pain and palpitations.  Gastrointestinal: Positive for abdominal pain. Negative for abdominal distention, constipation, diarrhea, nausea and vomiting.  Genitourinary: Positive for dysuria, frequency and urgency. Negative for flank pain and vaginal discharge.  Musculoskeletal: Negative for back pain, neck pain and neck stiffness.  Skin: Negative for rash and wound.  Neurological: Negative for light-headedness and headaches.  Psychiatric/Behavioral: Negative for agitation.  All other systems reviewed and are negative.    Physical Exam Updated Vital Signs BP 125/90   Pulse 83   Resp 20   Ht 5\' 8"  (1.727 m)   Wt 65.8 kg   SpO2 99%   BMI 22.05 kg/m   Physical Exam Vitals signs and nursing note reviewed.  Constitutional:      General: She is not in acute distress.    Appearance: She is well-developed. She is not ill-appearing, toxic-appearing or diaphoretic.  HENT:     Head: Normocephalic and atraumatic.     Right Ear: External ear normal.     Left Ear: External ear normal.      Nose: Nose normal.  Eyes:     Extraocular Movements: Extraocular movements intact.     Conjunctiva/sclera: Conjunctivae normal.     Pupils: Pupils are equal, round, and reactive to light.  Neck:     Musculoskeletal: Normal range of motion and neck supple.  Cardiovascular:     Rate and Rhythm: Normal rate.     Heart sounds: Normal heart sounds. No murmur.  Pulmonary:     Effort: Pulmonary effort is normal. No respiratory distress.     Breath sounds: No stridor.  Abdominal:     General: There is no distension.     Tenderness: There is no abdominal tenderness. There is no right CVA tenderness, left CVA tenderness, guarding or rebound.  Skin:    General: Skin is warm.     Capillary Refill: Capillary refill takes less than 2 seconds.  Findings: No erythema or rash.  Neurological:     General: No focal deficit present.     Mental Status: She is alert.     Motor: No abnormal muscle tone.     Coordination: Coordination normal.     Deep Tendon Reflexes: Reflexes are normal and symmetric.  Psychiatric:        Mood and Affect: Mood normal.      ED Treatments / Results  Labs (all labs ordered are listed, but only abnormal results are displayed) Labs Reviewed  URINALYSIS, ROUTINE W REFLEX MICROSCOPIC - Abnormal; Notable for the following components:      Result Value   APPearance CLOUDY (*)    Protein, ur 30 (*)    Leukocytes,Ua MODERATE (*)    All other components within normal limits  URINALYSIS, MICROSCOPIC (REFLEX) - Abnormal; Notable for the following components:   Bacteria, UA FEW (*)    All other components within normal limits  URINE CULTURE    EKG None  Radiology No results found.  Procedures Procedures (including critical care time)  Medications Ordered in ED Medications - No data to display   Initial Impression / Assessment and Plan / ED Course  I have reviewed the triage vital signs and the nursing notes.  Pertinent labs & imaging results that  were available during my care of the patient were reviewed by me and considered in my medical decision making (see chart for details).        [place] CallasFrances Girardin is a 73 y.o. 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  Patient reports that she has suprapubic fullness and pain consistent with prior UTI.  She reports that she was on Bactrim recently as well as started on Macrobid 4 days ago.  She reports at this point for his Macrobid, she feels that she is still having the abdominal pressure and discomfort consistent with UTI.  She denies any flank pain, back pain, nausea, vomiting, fevers, chills.  She denies any symptoms of pyelonephritis or sepsis.    She says that [facility]  Family reports that they would like to be switched back to Keflex as that worked well for her in the past.  On exam, abdomen is nontender.  Lungs clear.  Normal bowel sounds.  Patient is well-appearing complaining of the mild abdominal discomfort consistent with a UTI.  Had a long shared decision made conversation with patient and family.  Chart review shows that patient had urine culture from last year showing resistance to many antibiotics aside from nitrofurantoin and strong IV antibiotics.  Urine culture from last month did not show any significant growth.  We agreed to get a urinalysis and get a culture from today and then per patient request, she will be switched back to Keflex and follow-up with her urology and PCP.  They report that they are going to continue work-up with MRI and possible other imaging to further evaluate her incomplete voiding and retention.  She says that she is not interested in a Foley catheter placement today to continue draining her urine.  After urinalysis is completed, patient will be  given prescription for Keflex and discharged home.  Patient was advised that previous urinalysis did show resistance however, the last urinalysis did not show any growth therefore this may be a new bacteria that does have  sensitivity to it.  Patient and family will be discharged after urine is collected and culture sent.   Final Clinical Impressions(s) / ED Diagnoses   Final diagnoses:  Suprapubic pain  Acute cystitis without hematuria    ED Discharge Orders         Ordered    cephALEXin (KEFLEX) 500 MG capsule  2 times daily     02/21/19 0049         Clinical Impression: 1. Suprapubic pain   2. Acute cystitis without hematuria     Disposition: Discharge  Condition: Good  I have discussed the results, Dx and Tx plan with the pt(& family if present). He/she/they expressed understanding and agree(s) with the plan. Discharge instructions discussed at great length. Strict return precautions discussed and pt &/or family have verbalized understanding of the instructions. No further questions at time of discharge.    New Prescriptions   CEPHALEXIN (KEFLEX) 500 MG CAPSULE    Take 1 capsule (500 mg total) by mouth 2 (two) times daily for 7 days.    Follow Up: Truett Perna, MD [address] [place] [address] [phone]     Your urology team        Glendene Wyer, Canary Brim, MD 02/21/19 [phone]

## 2019-02-21 DIAGNOSIS — N3000 Acute cystitis without hematuria: Secondary | ICD-10-CM | POA: Diagnosis not present

## 2019-02-21 LAB — URINALYSIS, ROUTINE W REFLEX MICROSCOPIC
Bilirubin Urine: NEGATIVE
Glucose, UA: NEGATIVE mg/dL
Hgb urine dipstick: NEGATIVE
Ketones, ur: NEGATIVE mg/dL
Nitrite: NEGATIVE
Protein, ur: 30 mg/dL — AB
Specific Gravity, Urine: 1.015 (ref 1.005–1.030)
pH: 6.5 (ref 5.0–8.0)

## 2019-02-21 LAB — URINALYSIS, MICROSCOPIC (REFLEX): WBC, UA: >50 WBC/hpf (ref 0–5)

## 2019-02-21 MED ORDER — CEPHALEXIN 500 MG PO CAPS: 500.0000 mg | ORAL_CAPSULE | Freq: Two times a day (BID) | ORAL | 0 refills | Status: DC

## 2019-02-21 NOTE — Discharge Instructions (Signed)
Your history and exam are consistent with urinary tract infection does not seem to have cleared despite switching to the new antibiotic 4 days ago.  We had a long shared decision-making conversation and based on your request, you will be started back on the Keflex which worked for you last month for this.  Please take the antibiotics twice a day for the next week.  Please follow-up with your urologist for further work-up and management.  We discussed the possibility of looking for retention and catheter placement which you were not interested in today.  If any symptoms change or worsen, please return to the nearest emergency department.

## 2019-02-21 NOTE — ED Notes (Signed)
ED Provider at bedside. 

## 2019-02-22 LAB — URINE CULTURE: Culture: NO GROWTH

## 2019-02-24 ENCOUNTER — Emergency Department (HOSPITAL_BASED_OUTPATIENT_CLINIC_OR_DEPARTMENT_OTHER): Payer: Medicare Other

## 2019-02-24 ENCOUNTER — Encounter (HOSPITAL_BASED_OUTPATIENT_CLINIC_OR_DEPARTMENT_OTHER): Payer: Self-pay

## 2019-02-24 ENCOUNTER — Inpatient Hospital Stay (HOSPITAL_BASED_OUTPATIENT_CLINIC_OR_DEPARTMENT_OTHER)
Admission: EM | Admit: 2019-02-24 | Discharge: 2019-02-27 | DRG: 690 | Disposition: A | Discharge status: Home / Self Care | Payer: Medicare Other | Attending: Internal Medicine | Admitting: Internal Medicine

## 2019-02-24 ENCOUNTER — Other Ambulatory Visit: Payer: Self-pay

## 2019-02-24 DIAGNOSIS — B9620 Unspecified Escherichia coli [E. coli] as the cause of diseases classified elsewhere: Secondary | ICD-10-CM | POA: Diagnosis present

## 2019-02-24 DIAGNOSIS — Z87891 Personal history of nicotine dependence: Secondary | ICD-10-CM

## 2019-02-24 DIAGNOSIS — Z888 Allergy status to other drugs, medicaments and biological substances status: Secondary | ICD-10-CM

## 2019-02-24 DIAGNOSIS — E785 Hyperlipidemia, unspecified: Secondary | ICD-10-CM | POA: Diagnosis present

## 2019-02-24 DIAGNOSIS — Z95820 Peripheral vascular angioplasty status with implants and grafts: Secondary | ICD-10-CM

## 2019-02-24 DIAGNOSIS — I2510 Atherosclerotic heart disease of native coronary artery without angina pectoris: Secondary | ICD-10-CM | POA: Diagnosis present

## 2019-02-24 DIAGNOSIS — I10 Essential (primary) hypertension: Secondary | ICD-10-CM | POA: Diagnosis present

## 2019-02-24 DIAGNOSIS — R339 Retention of urine, unspecified: Secondary | ICD-10-CM | POA: Diagnosis present

## 2019-02-24 DIAGNOSIS — R778 Other specified abnormalities of plasma proteins: Secondary | ICD-10-CM

## 2019-02-24 DIAGNOSIS — Z79899 Other long term (current) drug therapy: Secondary | ICD-10-CM

## 2019-02-24 DIAGNOSIS — J0100 Acute maxillary sinusitis, unspecified: Secondary | ICD-10-CM

## 2019-02-24 DIAGNOSIS — Z7982 Long term (current) use of aspirin: Secondary | ICD-10-CM

## 2019-02-24 DIAGNOSIS — N3000 Acute cystitis without hematuria: Secondary | ICD-10-CM | POA: Diagnosis not present

## 2019-02-24 DIAGNOSIS — R7989 Other specified abnormal findings of blood chemistry: Secondary | ICD-10-CM

## 2019-02-24 DIAGNOSIS — Z1629 Resistance to other single specified antibiotic: Secondary | ICD-10-CM | POA: Diagnosis present

## 2019-02-24 DIAGNOSIS — M069 Rheumatoid arthritis, unspecified: Secondary | ICD-10-CM | POA: Diagnosis present

## 2019-02-24 DIAGNOSIS — Z791 Long term (current) use of non-steroidal anti-inflammatories (NSAID): Secondary | ICD-10-CM

## 2019-02-24 DIAGNOSIS — N390 Urinary tract infection, site not specified: Secondary | ICD-10-CM | POA: Diagnosis present

## 2019-02-24 DIAGNOSIS — Z91040 Latex allergy status: Secondary | ICD-10-CM

## 2019-02-24 DIAGNOSIS — R55 Syncope and collapse: Secondary | ICD-10-CM | POA: Diagnosis not present

## 2019-02-24 DIAGNOSIS — Z955 Presence of coronary angioplasty implant and graft: Secondary | ICD-10-CM

## 2019-02-24 DIAGNOSIS — Z1612 Extended spectrum beta lactamase (ESBL) resistance: Secondary | ICD-10-CM | POA: Diagnosis present

## 2019-02-24 DIAGNOSIS — Z87440 Personal history of urinary (tract) infections: Secondary | ICD-10-CM

## 2019-02-24 DIAGNOSIS — M1990 Unspecified osteoarthritis, unspecified site: Secondary | ICD-10-CM | POA: Diagnosis present

## 2019-02-24 DIAGNOSIS — B9629 Other Escherichia coli [E. coli] as the cause of diseases classified elsewhere: Secondary | ICD-10-CM | POA: Diagnosis present

## 2019-02-24 DIAGNOSIS — Z7902 Long term (current) use of antithrombotics/antiplatelets: Secondary | ICD-10-CM

## 2019-02-24 DIAGNOSIS — E7800 Pure hypercholesterolemia, unspecified: Secondary | ICD-10-CM | POA: Diagnosis present

## 2019-02-24 DIAGNOSIS — N3281 Overactive bladder: Secondary | ICD-10-CM | POA: Diagnosis present

## 2019-02-24 DIAGNOSIS — Z8249 Family history of ischemic heart disease and other diseases of the circulatory system: Secondary | ICD-10-CM

## 2019-02-24 DIAGNOSIS — Z20828 Contact with and (suspected) exposure to other viral communicable diseases: Secondary | ICD-10-CM | POA: Diagnosis present

## 2019-02-24 DIAGNOSIS — I6529 Occlusion and stenosis of unspecified carotid artery: Secondary | ICD-10-CM | POA: Diagnosis present

## 2019-02-24 DIAGNOSIS — E860 Dehydration: Secondary | ICD-10-CM

## 2019-02-24 HISTORY — DX: Atherosclerotic heart disease of native coronary artery without angina pectoris: I25.10

## 2019-02-24 LAB — BASIC METABOLIC PANEL
Anion gap: 9 (ref 5–15)
BUN: 17 mg/dL (ref 8–23)
CO2: 23 mmol/L (ref 22–32)
Calcium: 9.1 mg/dL (ref 8.9–10.3)
Chloride: 105 mmol/L (ref 98–111)
Creatinine, Ser: 0.73 mg/dL (ref 0.44–1.00)
GFR calc Af Amer: >60 mL/min (ref >60)
GFR calc non Af Amer: >60 mL/min (ref >60)
Glucose, Bld: 95 mg/dL (ref 70–99)
Potassium: 4.1 mmol/L (ref 3.5–5.1)
Sodium: 137 mmol/L (ref 135–145)

## 2019-02-24 LAB — URINALYSIS, ROUTINE W REFLEX MICROSCOPIC
Bilirubin Urine: NEGATIVE
Glucose, UA: NEGATIVE mg/dL
Hgb urine dipstick: NEGATIVE
Ketones, ur: NEGATIVE mg/dL
Nitrite: NEGATIVE
Protein, ur: NEGATIVE mg/dL
Specific Gravity, Urine: 1.01 (ref 1.005–1.030)
pH: 5.5 (ref 5.0–8.0)

## 2019-02-24 LAB — CBC WITH DIFFERENTIAL/PLATELET
Abs Immature Granulocytes: 0.01 10*3/uL (ref 0.00–0.07)
Basophils Absolute: 0 10*3/uL (ref 0.0–0.1)
Basophils Relative: 0 %
Eosinophils Absolute: 0.1 10*3/uL (ref 0.0–0.5)
Eosinophils Relative: 1 %
HCT: 34.6 % — ABNORMAL LOW (ref 36.0–46.0)
Hemoglobin: 10.3 g/dL — ABNORMAL LOW (ref 12.0–15.0)
Immature Granulocytes: 0 %
Lymphocytes Relative: 14 %
Lymphs Abs: 1.2 10*3/uL (ref 0.7–4.0)
MCH: 27.2 pg (ref 26.0–34.0)
MCHC: 29.8 g/dL — ABNORMAL LOW (ref 30.0–36.0)
MCV: 91.5 fL (ref 80.0–100.0)
Monocytes Absolute: 1 10*3/uL (ref 0.1–1.0)
Monocytes Relative: 12 %
Neutro Abs: 6.2 10*3/uL (ref 1.7–7.7)
Neutrophils Relative %: 73 %
Platelets: 235 10*3/uL (ref 150–400)
RBC: 3.78 MIL/uL — ABNORMAL LOW (ref 3.87–5.11)
RDW: 17.3 % — ABNORMAL HIGH (ref 11.5–15.5)
WBC: 8.5 10*3/uL (ref 4.0–10.5)
nRBC: 0 % (ref 0.0–0.2)

## 2019-02-24 LAB — URINALYSIS, MICROSCOPIC (REFLEX)

## 2019-02-24 MED ORDER — SODIUM CHLORIDE 0.9 % IV BOLUS
500.0000 mL | Freq: Once | INTRAVENOUS | Status: AC
  Administered 2019-02-24: 500 mL via INTRAVENOUS

## 2019-02-24 MED ORDER — CEPHALEXIN 250 MG PO CAPS
500.0000 mg | ORAL_CAPSULE | Freq: Once | ORAL | Status: AC
  Administered 2019-02-25: 500 mg via ORAL
  Filled 2019-02-24: qty 2

## 2019-02-24 NOTE — ED Notes (Signed)
[place] [facility]: [phone] Cell- [place]: [phone]

## 2019-02-24 NOTE — ED Triage Notes (Signed)
Pt was getting hair done this evening, felt fine until she went under dryer, witnessed syncope, LOC no fall noted, hx of same. Was disoriented for a few minutes after but is now a&Ox4. States no pain or complaints.    EMS vitals: 138/70, 153cbg, hr 90, 98% ra  116/70 sitting 101/66 standing

## 2019-02-25 ENCOUNTER — Encounter (HOSPITAL_COMMUNITY): Payer: Medicare Other

## 2019-02-25 ENCOUNTER — Encounter (HOSPITAL_BASED_OUTPATIENT_CLINIC_OR_DEPARTMENT_OTHER): Payer: Self-pay | Admitting: Emergency Medicine

## 2019-02-25 DIAGNOSIS — Z955 Presence of coronary angioplasty implant and graft: Secondary | ICD-10-CM | POA: Diagnosis not present

## 2019-02-25 DIAGNOSIS — I2510 Atherosclerotic heart disease of native coronary artery without angina pectoris: Secondary | ICD-10-CM | POA: Diagnosis present

## 2019-02-25 DIAGNOSIS — Z8249 Family history of ischemic heart disease and other diseases of the circulatory system: Secondary | ICD-10-CM | POA: Diagnosis not present

## 2019-02-25 DIAGNOSIS — Z7902 Long term (current) use of antithrombotics/antiplatelets: Secondary | ICD-10-CM | POA: Diagnosis not present

## 2019-02-25 DIAGNOSIS — M1990 Unspecified osteoarthritis, unspecified site: Secondary | ICD-10-CM | POA: Diagnosis present

## 2019-02-25 DIAGNOSIS — N390 Urinary tract infection, site not specified: Secondary | ICD-10-CM

## 2019-02-25 DIAGNOSIS — Z888 Allergy status to other drugs, medicaments and biological substances status: Secondary | ICD-10-CM | POA: Diagnosis not present

## 2019-02-25 DIAGNOSIS — Z791 Long term (current) use of non-steroidal anti-inflammatories (NSAID): Secondary | ICD-10-CM | POA: Diagnosis not present

## 2019-02-25 DIAGNOSIS — Z79899 Other long term (current) drug therapy: Secondary | ICD-10-CM | POA: Diagnosis not present

## 2019-02-25 DIAGNOSIS — M069 Rheumatoid arthritis, unspecified: Secondary | ICD-10-CM | POA: Diagnosis present

## 2019-02-25 DIAGNOSIS — R339 Retention of urine, unspecified: Secondary | ICD-10-CM | POA: Diagnosis present

## 2019-02-25 DIAGNOSIS — Z1612 Extended spectrum beta lactamase (ESBL) resistance: Secondary | ICD-10-CM

## 2019-02-25 DIAGNOSIS — B9620 Unspecified Escherichia coli [E. coli] as the cause of diseases classified elsewhere: Secondary | ICD-10-CM | POA: Diagnosis present

## 2019-02-25 DIAGNOSIS — Z91040 Latex allergy status: Secondary | ICD-10-CM | POA: Diagnosis not present

## 2019-02-25 DIAGNOSIS — N3281 Overactive bladder: Secondary | ICD-10-CM | POA: Diagnosis present

## 2019-02-25 DIAGNOSIS — E785 Hyperlipidemia, unspecified: Secondary | ICD-10-CM | POA: Diagnosis present

## 2019-02-25 DIAGNOSIS — E7800 Pure hypercholesterolemia, unspecified: Secondary | ICD-10-CM | POA: Diagnosis present

## 2019-02-25 DIAGNOSIS — R7989 Other specified abnormal findings of blood chemistry: Secondary | ICD-10-CM | POA: Diagnosis present

## 2019-02-25 DIAGNOSIS — Z1629 Resistance to other single specified antibiotic: Secondary | ICD-10-CM | POA: Diagnosis present

## 2019-02-25 DIAGNOSIS — Z87440 Personal history of urinary (tract) infections: Secondary | ICD-10-CM | POA: Diagnosis not present

## 2019-02-25 DIAGNOSIS — B9629 Other Escherichia coli [E. coli] as the cause of diseases classified elsewhere: Secondary | ICD-10-CM

## 2019-02-25 DIAGNOSIS — Z7982 Long term (current) use of aspirin: Secondary | ICD-10-CM | POA: Diagnosis not present

## 2019-02-25 DIAGNOSIS — Z20828 Contact with and (suspected) exposure to other viral communicable diseases: Secondary | ICD-10-CM | POA: Diagnosis present

## 2019-02-25 DIAGNOSIS — R55 Syncope and collapse: Secondary | ICD-10-CM | POA: Diagnosis present

## 2019-02-25 DIAGNOSIS — N3000 Acute cystitis without hematuria: Secondary | ICD-10-CM | POA: Diagnosis present

## 2019-02-25 DIAGNOSIS — I10 Essential (primary) hypertension: Secondary | ICD-10-CM | POA: Diagnosis present

## 2019-02-25 DIAGNOSIS — Z95820 Peripheral vascular angioplasty status with implants and grafts: Secondary | ICD-10-CM | POA: Diagnosis not present

## 2019-02-25 DIAGNOSIS — Z87891 Personal history of nicotine dependence: Secondary | ICD-10-CM | POA: Diagnosis not present

## 2019-02-25 DIAGNOSIS — I340 Nonrheumatic mitral (valve) insufficiency: Secondary | ICD-10-CM | POA: Diagnosis not present

## 2019-02-25 LAB — TROPONIN I (HIGH SENSITIVITY)
Troponin I (High Sensitivity): 18 ng/L — ABNORMAL HIGH (ref <18)
Troponin I (High Sensitivity): 21 ng/L — ABNORMAL HIGH (ref <18)

## 2019-02-25 LAB — TSH: TSH: 0.78 u[IU]/mL (ref 0.350–4.500)

## 2019-02-25 LAB — SARS CORONAVIRUS 2 (TAT 6-24 HRS): SARS Coronavirus 2: NEGATIVE

## 2019-02-25 MED ORDER — ONDANSETRON HCL 4 MG PO TABS: 4.0000 mg | ORAL_TABLET | Freq: Four times a day (QID) | ORAL | Status: DC | PRN

## 2019-02-25 MED ORDER — OXYBUTYNIN CHLORIDE 5 MG PO TABS
5.0000 mg | ORAL_TABLET | Freq: Two times a day (BID) | ORAL | Status: DC
  Administered 2019-02-25 – 2019-02-27 (×5): 5 mg via ORAL
  Filled 2019-02-25 (×5): qty 1

## 2019-02-25 MED ORDER — ACETAMINOPHEN 325 MG PO TABS: 650.0000 mg | ORAL_TABLET | Freq: Four times a day (QID) | ORAL | Status: DC | PRN

## 2019-02-25 MED ORDER — LACTATED RINGERS IV SOLN
INTRAVENOUS | Status: DC
  Administered 2019-02-25 – 2019-02-26 (×2): via INTRAVENOUS

## 2019-02-25 MED ORDER — TAMSULOSIN HCL 0.4 MG PO CAPS
0.4000 mg | ORAL_CAPSULE | Freq: Every day | ORAL | Status: DC
  Administered 2019-02-25 – 2019-02-27 (×3): 0.4 mg via ORAL
  Filled 2019-02-25 (×3): qty 1

## 2019-02-25 MED ORDER — ATORVASTATIN CALCIUM 40 MG PO TABS
40.0000 mg | ORAL_TABLET | Freq: Every day | ORAL | Status: DC
  Administered 2019-02-25 – 2019-02-26 (×2): 40 mg via ORAL
  Filled 2019-02-25 (×2): qty 1

## 2019-02-25 MED ORDER — PHENAZOPYRIDINE HCL 200 MG PO TABS
200.0000 mg | ORAL_TABLET | Freq: Three times a day (TID) | ORAL | Status: DC
  Administered 2019-02-25 – 2019-02-27 (×7): 200 mg via ORAL
  Filled 2019-02-25 (×9): qty 1

## 2019-02-25 MED ORDER — ACETAMINOPHEN 650 MG RE SUPP: 650.0000 mg | Freq: Four times a day (QID) | RECTAL | Status: DC | PRN

## 2019-02-25 MED ORDER — ASPIRIN 81 MG PO CHEW
81.0000 mg | CHEWABLE_TABLET | Freq: Every day | ORAL | Status: DC
  Administered 2019-02-25 – 2019-02-26 (×2): 81 mg via ORAL
  Filled 2019-02-25 (×2): qty 1

## 2019-02-25 MED ORDER — AMLODIPINE BESYLATE-VALSARTAN 5-320 MG PO TABS: 0.5000 | ORAL_TABLET | Freq: Every day | ORAL | Status: DC

## 2019-02-25 MED ORDER — DIPHENHYDRAMINE HCL 25 MG PO CAPS
25.0000 mg | ORAL_CAPSULE | Freq: Once | ORAL | Status: AC
  Administered 2019-02-25: 25 mg via ORAL
  Filled 2019-02-25: qty 1

## 2019-02-25 MED ORDER — CLOPIDOGREL BISULFATE 75 MG PO TABS
75.0000 mg | ORAL_TABLET | Freq: Every day | ORAL | Status: DC
  Administered 2019-02-25 – 2019-02-27 (×3): 75 mg via ORAL
  Filled 2019-02-25 (×3): qty 1

## 2019-02-25 MED ORDER — GABAPENTIN 800 MG PO TABS
800.0000 mg | ORAL_TABLET | Freq: Three times a day (TID) | ORAL | Status: DC
  Filled 2019-02-25 (×3): qty 1

## 2019-02-25 MED ORDER — ONDANSETRON HCL 4 MG/2ML IJ SOLN: 4.0000 mg | Freq: Four times a day (QID) | INTRAMUSCULAR | Status: DC | PRN

## 2019-02-25 MED ORDER — GABAPENTIN 400 MG PO CAPS
800.0000 mg | ORAL_CAPSULE | Freq: Three times a day (TID) | ORAL | Status: DC | PRN
  Administered 2019-02-25: 800 mg via ORAL
  Filled 2019-02-25: qty 2

## 2019-02-25 MED ORDER — BUSPIRONE HCL 5 MG PO TABS
15.0000 mg | ORAL_TABLET | Freq: Three times a day (TID) | ORAL | Status: DC
  Administered 2019-02-25 – 2019-02-27 (×7): 15 mg via ORAL
  Filled 2019-02-25 (×7): qty 3

## 2019-02-25 MED ORDER — ENOXAPARIN SODIUM 40 MG/0.4ML ~~LOC~~ SOLN
40.0000 mg | SUBCUTANEOUS | Status: DC
  Administered 2019-02-25 – 2019-02-26 (×2): 40 mg via SUBCUTANEOUS
  Filled 2019-02-25 (×3): qty 0.4

## 2019-02-25 MED ORDER — SODIUM CHLORIDE 0.9% FLUSH
3.0000 mL | Freq: Two times a day (BID) | INTRAVENOUS | Status: DC
  Administered 2019-02-25 – 2019-02-27 (×5): 3 mL via INTRAVENOUS

## 2019-02-25 MED ORDER — PIPERACILLIN-TAZOBACTAM 3.375 G IVPB
3.3750 g | Freq: Three times a day (TID) | INTRAVENOUS | Status: DC
  Administered 2019-02-25 – 2019-02-27 (×6): 3.375 g via INTRAVENOUS
  Filled 2019-02-25 (×5): qty 50

## 2019-02-25 MED ORDER — AMLODIPINE BESYLATE 2.5 MG PO TABS
2.5000 mg | ORAL_TABLET | Freq: Every day | ORAL | Status: DC
  Administered 2019-02-25 – 2019-02-27 (×3): 2.5 mg via ORAL
  Filled 2019-02-25 (×3): qty 1

## 2019-02-25 MED ORDER — PRAVASTATIN SODIUM 40 MG PO TABS
40.0000 mg | ORAL_TABLET | Freq: Every day | ORAL | Status: DC
  Administered 2019-02-25: 40 mg via ORAL
  Filled 2019-02-25: qty 1

## 2019-02-25 MED ORDER — IRBESARTAN 300 MG PO TABS
150.0000 mg | ORAL_TABLET | Freq: Every day | ORAL | Status: DC
  Administered 2019-02-25 – 2019-02-27 (×3): 150 mg via ORAL
  Filled 2019-02-25 (×3): qty 1

## 2019-02-25 NOTE — ED Notes (Signed)
Report given to [facility]. 

## 2019-02-25 NOTE — Evaluation (Signed)
Physical Therapy Evaluation Patient Details Name: Sara Villanueva MRN: [id number] DOB: Aug 30, 1945 Today's Date: 02/25/2019   History of Present Illness  Skila Rollins is a 73 y.o. female with medical history significant of RA; HTN; HLD; CAD s/p stent; urinary retention with bladder outlet obstruction requiring self-caths for the last 2 months; and carotid disease s/p endarterectomy presenting with syncope.  Clinical Impression  Pt presenting with above. Pt denies dizziness during ambulation or tolieting. BP 126/80 s/p ambulation. Pt functioning near baseline. Pt with good home set up and support. Acute PT to cont to follow.    Follow Up Recommendations No PT follow up;Supervision - Intermittent    Equipment Recommendations  None recommended by PT    Recommendations for Other Services       Precautions / Restrictions Precautions Precautions: Fall Restrictions Weight Bearing Restrictions: No      Mobility  Bed Mobility Overal bed mobility: Needs Assistance Bed Mobility: Supine to Sit     Supine to sit: Supervision     General bed mobility comments: HOB elevated  Transfers Overall transfer level: Needs assistance Equipment used: None Transfers: Sit to/from Stand Sit to Stand: Min guard         General transfer comment: increased time, posterior lean initially back on the bed, pt able to catch balance  Ambulation/Gait Ambulation/Gait assistance: Min guard Gait Distance (Feet): 225 Feet Assistive device: None Gait Pattern/deviations: Step-through pattern;Decreased stride length;Trunk flexed Gait velocity: dec Gait velocity interpretation: 1.31 - 2.62 ft/sec, indicative of limited community ambulator General Gait Details: initially short shuffled guarded steps to the bathroom and then progressed to more fluid gait pattern and increased step length. Pt without LOB and denies dizziness  Stairs            Wheelchair Mobility    Modified Rankin (Stroke  Patients Only)       Balance Overall balance assessment: Mild deficits observed, not formally tested(pt tolieted and washed hands without LOB)                                           Pertinent Vitals/Pain Pain Assessment: No/denies pain    Home Living Family/patient expects to be discharged to:: Private residence Living Arrangements: Spouse/significant other Available Help at Discharge: Family;Available 24 hours/day Type of Home: House Home Access: Stairs to enter Entrance Stairs-Rails: Right Entrance Stairs-Number of Steps: 2 Home Layout: Able to live on main level with bedroom/bathroom Home Equipment: Crutches;Grab bars - tub/shower      Prior Function Level of Independence: Independent         Comments: was driving     Hand Dominance   Dominant Hand: Right    Extremity/Trunk Assessment   Upper Extremity Assessment Upper Extremity Assessment: Overall WFL for tasks assessed    Lower Extremity Assessment Lower Extremity Assessment: Generalized weakness    Cervical / Trunk Assessment Cervical / Trunk Assessment: Kyphotic  Communication   Communication: No difficulties  Cognition Arousal/Alertness: Awake/alert Behavior During Therapy: WFL for tasks assessed/performed Overall Cognitive Status: Within Functional Limits for tasks assessed                                        General Comments General comments (skin integrity, edema, etc.): WFL, VSS    Exercises     Assessment/Plan  PT Assessment Patient needs continued PT services  PT Problem List Decreased activity tolerance;Decreased balance;Decreased mobility       PT Treatment Interventions DME instruction;Gait training;Stair training;Functional mobility training;Therapeutic activities;Therapeutic exercise;Balance training;Neuromuscular re-education    PT Goals (Current goals can be found in the Care Plan section)  Acute Rehab PT Goals Patient Stated Goal:  home today PT Goal Formulation: With patient Time For Goal Achievement: 03/11/19 Potential to Achieve Goals: Good    Frequency Min 2X/week   Barriers to discharge        Co-evaluation               AM-PAC PT "6 Clicks" Mobility  Outcome Measure Help needed turning from your back to your side while in a flat bed without using bedrails?: None Help needed moving from lying on your back to sitting on the side of a flat bed without using bedrails?: None Help needed moving to and from a bed to a chair (including a wheelchair)?: None Help needed standing up from a chair using your arms (e.g., wheelchair or bedside chair)?: None Help needed to walk in hospital room?: A Little Help needed climbing 3-5 steps with a railing? : A Little 6 Click Score: 22    End of Session Equipment Utilized During Treatment: Gait belt Activity Tolerance: Patient tolerated treatment well Patient left: in chair;with call bell/phone within reach Nurse Communication: Mobility status PT Visit Diagnosis: Unsteadiness on feet (R26.81);Muscle weakness (generalized) (M62.81)    Time: 4287-6811 PT Time Calculation (min) (ACUTE ONLY): 21 min   Charges:   PT Evaluation $PT Eval Low Complexity: 1 Low          Lewis Shock, PT, DPT Acute Rehabilitation Services Pager #: [phone] Office #: [phone]   Iona Hansen 02/25/2019, 4:32 PM

## 2019-02-25 NOTE — ED Notes (Signed)
Pt daughter at bedside since 20 minutes ago. Pt appears more anxious. Daughter asking about why patient needs to admitted, and that she just needed to have blood work and go home. Daughter's mood appears to be negative. MD aware. Pt voiced no concerns about being admitted.

## 2019-02-25 NOTE — ED Notes (Signed)
Daughter notified of pt's bed assignment and approximate time of transfer

## 2019-02-25 NOTE — ED Notes (Signed)
Dr Randal Buba and myself in room to talk with pt and her daughter  Dr Randal Buba explained reasons for admission to pt and pt's daughter and also explained that [facility] has no available beds therefore we are getting her admitted to [facility]  Pt's daughter told us that it is up to the pt as to where she is admitted and not the doctors decision  Attempted to explain the pt needed to be admitted to the hospital with available beds but the daughter became loud and was no receptive to what we had to say  Pt's daughter states she needed to make a phone call  This writer told the daughter after she made her phone call if she had any further questions I would be glad to speak with her again

## 2019-02-25 NOTE — ED Provider Notes (Signed)
[facility] EMERGENCY DEPARTMENT Provider Note   CSN: [id number] Arrival date & time: 02/24/19  2037     History   Chief Complaint Chief Complaint  Patient presents with  . Near Syncope    HPI Sara Villanueva is a 73 y.o. female.     The history is provided by the patient.  Loss of Consciousness Episode history:  Single Most recent episode:  Today Timing:  Rare Progression:  Resolved Chronicity:  New Context: not inactivity   Context comment:  Was seated getting her hair done Witnessed: yes   Relieved by:  Nothing Worsened by:  Nothing Ineffective treatments:  None tried Associated symptoms: no chest pain, no diaphoresis, no fever, no focal weakness, no nausea, no recent fall, no shortness of breath and no vomiting   Risk factors: no congenital heart disease   Patient with HTN and intermittent self catheterization with recent labile BP presents with a witnessed episode of syncope while getting her hair done.  Denies f/c/r.  No CP, no SOB no cough.    Past Medical History:  Diagnosis Date  . Arthritis   . Bladder disorder   . Carotid arterial disease (HCC)   . High cholesterol   . Hypertension   . Rheumatoid arteritis (HCC)   . Rheumatoid arthritis [facility])     Patient Active Problem List   Diagnosis Date Noted  . Syncope 02/25/2019  . Knee pain, left 08/05/2015  . Allergic rhinitis 07/15/2015  . Carotid artery narrowing 07/15/2015  . Peripheral vascular disease (HCC) 07/15/2015  . Renal artery stenosis (HCC) 07/15/2015  . 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 Benign essential HTN 06/13/2015  . HLD (hyperlipidemia) 06/13/2015  . Rheumatoid arthritis (HCC) 06/13/2015  . Urgency of micturation 05/03/2015  . FOM (frequency of micturition) 05/03/2015  . Pedal edema 12/04/2014  . Dysesthesia 12/04/2014  . Nuclear sclerotic cataract 09/20/2014  . Lumbosacral radiculopathy 08/16/2014  . Spondylolisthesis at L5-S1 level 08/07/2014  . Neck pain  08/07/2014  . Chronic rheumatic arthritis (HCC) 08/07/2014  . Neuralgia neuritis, sciatic nerve 08/03/2014  . Aseptic bony necrosis (HCC) 08/03/2014  . Aseptic necrosis of bone (HCC) 08/03/2014    Past Surgical History:  Procedure Laterality Date  . CAROTID ENDARTERECTOMY    . CORONARY ANGIOPLASTY WITH STENT PLACEMENT       OB History   No obstetric history on file.      Home Medications    Prior to Admission medications   Medication Sig Start Date End Date Taking? Authorizing Provider  amLODipine-valsartan (EXFORGE) 5-320 MG tablet Take 0.5 tablets by mouth daily.  01/08/16 01/17/18  [provider]  aspirin 81 MG tablet Take 81 mg by mouth at bedtime.     [provider]  busPIRone (BUSPAR) 15 MG tablet Take 15 mg by mouth 3 (three) times daily.  08/01/14   [provider]  cephALEXin (KEFLEX) 500 MG capsule Take 1 capsule (500 mg total) by mouth 2 (two) times daily. 01/19/19   Rome Echavarria, MD  cephALEXin (KEFLEX) 500 MG capsule Take 1 capsule (500 mg total) by mouth 2 (two) times daily for 7 days. 02/21/19 02/28/19  Tegeler, Canary Brim, MD  clopidogrel (PLAVIX) 75 MG tablet Take 75 mg by mouth daily.    [provider]  ezetimibe (ZETIA) 10 MG tablet Take 10 mg by mouth at bedtime.     [provider]  gabapentin (NEURONTIN) 800 MG tablet TAKE 1 TABLET BY MOUTH THREE TIMES DAILY 01/30/19  Sater, Pearletha Furlichard A, MD  hydrochlorothiazide (HYDRODIURIL) 25 MG tablet Take 1 tablet (25 mg total) by mouth daily. 07/14/15 04/02/16  Alvira MondaySchlossman, Erin, MD  meloxicam (MOBIC) 7.5 MG tablet Take 1 tablet (7.5 mg total) by mouth daily. 12/14/17   Sater, Pearletha Furlichard A, MD  NITROSTAT 0.4 MG SL tablet Place 0.4 mg under the tongue every 5 (five) minutes as needed for chest pain.  07/08/14   [provider]  oxybutynin (DITROPAN) 5 MG tablet Take 1 tablet (5 mg total) by mouth 2 (two) times daily. 01/11/19   Vanetta MuldersZackowski, Scott, MD  phenazopyridine (PYRIDIUM) 200  MG tablet Take 1 tablet (200 mg total) by mouth 3 (three) times daily. 01/19/19   Kaan Tosh, MD  pravastatin (PRAVACHOL) 40 MG tablet Take 40 mg by mouth daily.    [provider]  solifenacin (VESICARE) 10 MG tablet Take 10 mg by mouth daily.    [provider]  tamsulosin (FLOMAX) 0.4 MG CAPS capsule Take 1 capsule (0.4 mg total) by mouth daily. 01/19/19   Trust Crago, MD  trospium (SANCTURA) 20 MG tablet Take 20 mg by mouth at bedtime.    [provider]    Family History Family History  Problem Relation Age of Onset  . Heart disease Mother   . Heart disease Father     Social History Social History   Tobacco Use  . Smoking status: Former Games developermoker  . Smokeless tobacco: Never Used  Substance Use Topics  . Alcohol use: No    Alcohol/week: 0.0 standard drinks  . Drug use: No     Allergies   Nickel, Latex, and Lamictal [lamotrigine]   Review of Systems Review of Systems  Constitutional: Negative for diaphoresis and fever.  HENT: Negative for congestion.   Eyes: Negative for visual disturbance.  Respiratory: Negative for cough and shortness of breath.   Cardiovascular: Positive for syncope. Negative for chest pain.  Gastrointestinal: Negative for nausea and vomiting.  Genitourinary: Negative for flank pain.  Musculoskeletal: Negative for arthralgias.  Neurological: Positive for syncope. Negative for focal weakness, facial asymmetry and numbness.  Psychiatric/Behavioral: Negative for agitation.  All other systems reviewed and are negative.    Physical Exam Updated Vital Signs BP (!) 125/56 (BP Location: Right Arm)   Pulse 88   Temp 98.5 F (36.9 C)   Resp 18   Ht 5\' 8"  (1.727 m)   Wt 65.8 kg   SpO2 100%   BMI 22.05 kg/m   Physical Exam Vitals signs and nursing note reviewed.  Constitutional:      General: She is not in acute distress.    Appearance: She is normal weight.  HENT:     Head: Normocephalic and atraumatic.      Nose: Nose normal.     Mouth/Throat:     Mouth: Mucous membranes are moist.     Pharynx: Oropharynx is clear.  Eyes:     Conjunctiva/sclera: Conjunctivae normal.     Pupils: Pupils are equal, round, and reactive to light.  Neck:     Musculoskeletal: Normal range of motion and neck supple.  Cardiovascular:     Rate and Rhythm: Normal rate and regular rhythm.     Pulses: Normal pulses.     Heart sounds: Normal heart sounds.  Pulmonary:     Effort: Pulmonary effort is normal.     Breath sounds: Normal breath sounds.  Abdominal:     General: Abdomen is flat. Bowel sounds are normal.     Tenderness:  There is no abdominal tenderness. There is no guarding.  Musculoskeletal: Normal range of motion.  Skin:    General: Skin is warm and dry.     Capillary Refill: Capillary refill takes less than 2 seconds.  Neurological:     General: No focal deficit present.     Mental Status: She is alert and oriented to person, place, and time.     Deep Tendon Reflexes: Reflexes normal.  Psychiatric:        Mood and Affect: Mood normal.        Behavior: Behavior normal.      ED Treatments / Results  Labs (all labs ordered are listed, but only abnormal results are displayed) Results for orders placed or performed during the hospital encounter of 02/24/19  CBC with Differential/Platelet  Result Value Ref Range   WBC 8.5 4.0 - 10.5 K/uL   RBC 3.78 (L) 3.87 - 5.11 MIL/uL   Hemoglobin 10.3 (L) 12.0 - 15.0 g/dL   HCT 16.134.6 (L) 09.636.0 - 04.546.0 %   MCV 91.5 80.0 - 100.0 fL   MCH 27.2 26.0 - 34.0 pg   MCHC 29.8 (L) 30.0 - 36.0 g/dL   RDW 40.917.3 (H) 81.111.5 - 91.415.5 %   Platelets 235 150 - 400 K/uL   nRBC 0.0 0.0 - 0.2 %   Neutrophils Relative % 73 %   Neutro Abs 6.2 1.7 - 7.7 K/uL   Lymphocytes Relative 14 %   Lymphs Abs 1.2 0.7 - 4.0 K/uL   Monocytes Relative 12 %   Monocytes Absolute 1.0 0.1 - 1.0 K/uL   Eosinophils Relative 1 %   Eosinophils Absolute 0.1 0.0 - 0.5 K/uL   Basophils Relative 0 %    Basophils Absolute 0.0 0.0 - 0.1 K/uL   Immature Granulocytes 0 %   Abs Immature Granulocytes 0.01 0.00 - 0.07 K/uL  Basic metabolic panel  Result Value Ref Range   Sodium 137 135 - 145 mmol/L   Potassium 4.1 3.5 - 5.1 mmol/L   Chloride 105 98 - 111 mmol/L   CO2 23 22 - 32 mmol/L   Glucose, Bld 95 70 - 99 mg/dL   BUN 17 8 - 23 mg/dL   Creatinine, Ser 7.820.73 0.44 - 1.00 mg/dL   Calcium 9.1 8.9 - 95.610.3 mg/dL   GFR calc non Af Amer >60 >60 mL/min   GFR calc Af Amer >60 >60 mL/min   Anion gap 9 5 - 15  Urinalysis, Routine w reflex microscopic  Result Value Ref Range   Color, Urine YELLOW YELLOW   APPearance HAZY (A) CLEAR   Specific Gravity, Urine 1.010 1.005 - 1.030   pH 5.5 5.0 - 8.0   Glucose, UA NEGATIVE NEGATIVE mg/dL   Hgb urine dipstick NEGATIVE NEGATIVE   Bilirubin Urine NEGATIVE NEGATIVE   Ketones, ur NEGATIVE NEGATIVE mg/dL   Protein, ur NEGATIVE NEGATIVE mg/dL   Nitrite NEGATIVE NEGATIVE   Leukocytes,Ua TRACE (A) NEGATIVE  Urinalysis, Microscopic (reflex)  Result Value Ref Range   RBC / HPF 0-5 0 - 5 RBC/hpf   WBC, UA 11-20 0 - 5 WBC/hpf   Bacteria, UA RARE (A) NONE SEEN   Squamous Epithelial / LPF 6-10 0 - 5   Non Squamous Epithelial PRESENT (A) NONE SEEN   WBC Clumps PRESENT   Troponin I (High Sensitivity)  Result Value Ref Range   Troponin I (High Sensitivity) 18 (H) <18 ng/L   Ct Head Wo Contrast  Result Date: 02/24/2019 CLINICAL DATA:  Syncope EXAM: CT HEAD WITHOUT CONTRAST TECHNIQUE: Contiguous axial images were obtained from the base of the skull through the vertex without intravenous contrast. COMPARISON:  10/28/2011 FINDINGS: Brain: Mild age related volume loss. No acute intracranial abnormality. Specifically, no hemorrhage, hydrocephalus, mass lesion, acute infarction, or significant intracranial injury. Vascular: No hyperdense vessel or unexpected calcification. Skull: No acute calvarial abnormality. Sinuses/Orbits: Mucosal thickening and air-fluid level in  the right maxillary sinus. Other: None IMPRESSION: No acute intracranial abnormality. Acute on chronic right maxillary sinusitis. Electronically Signed   By: Rolm Baptise M.D.   On: 02/24/2019 23:45    EKG EKG Interpretation  Date/Time:  Friday February 24 2019 23:17:15 EDT Ventricular Rate:  84 PR Interval:    QRS Duration: 94 QT Interval:  395 QTC Calculation: 467 R Axis:   63 Text Interpretation:  Sinus rhythm Left atrial enlargement LVH with secondary repolarization abnormality Baseline wander in lead(s) I II aVR Confirmed by Randal Buba, Rebel Laughridge ([id number]) on 02/24/2019 11:58:20 PM   Radiology Ct Head Wo Contrast  Result Date: 02/24/2019 CLINICAL DATA:  Syncope EXAM: CT HEAD WITHOUT CONTRAST TECHNIQUE: Contiguous axial images were obtained from the base of the skull through the vertex without intravenous contrast. COMPARISON:  10/28/2011 FINDINGS: Brain: Mild age related volume loss. No acute intracranial abnormality. Specifically, no hemorrhage, hydrocephalus, mass lesion, acute infarction, or significant intracranial injury. Vascular: No hyperdense vessel or unexpected calcification. Skull: No acute calvarial abnormality. Sinuses/Orbits: Mucosal thickening and air-fluid level in the right maxillary sinus. Other: None IMPRESSION: No acute intracranial abnormality. Acute on chronic right maxillary sinusitis. Electronically Signed   By: Rolm Baptise M.D.   On: 02/24/2019 23:45    Procedures Procedures (including critical care time)  Medications Ordered in ED Medications  sodium chloride 0.9 % bolus 500 mL (0 mLs Intravenous Stopped 02/25/19 0046)  cephALEXin (KEFLEX) capsule 500 mg (500 mg Oral Given 02/25/19 0004)     Family requests [facility] they are holding multiple patients in the ED at this time  Will contact [facility] for admission given complexity of patient.  Patient has a UTI, sinus infection and elevated troponin.  Will need inpatient care.    Final Clinical Impressions(s) / ED Diagnoses    Final diagnoses:  Syncope and collapse  Acute cystitis without hematuria  Acute maxillary sinusitis, recurrence not specified  Dehydration  Elevated troponin    Case d/w Dr. Marlowe Sax.  Admit to medicine at [facility].     Kelon Easom, MD 02/25/19 [phone]

## 2019-02-25 NOTE — H&P (Addendum)
History and Physical    Sara Villanueva EXB:[id number] DOB: [birth year] DOA: 02/24/2019  PCP: Truett Perna, MD Consultants:  Ashley Royalty - urology; [place] - cardiology; [place] - GI; Belfi - rheumatology Patient coming from:  Home - lives with husband; NOK: Husband, [phone]  Chief Complaint: Syncope  HPI: Sara Villanueva is a 73 y.o. female with medical history significant of RA; HTN; HLD; CAD s/p stent; urinary retention with bladder outlet obstruction requiring self-caths for the last 2 months; and carotid disease s/p endarterectomy presenting with syncope.  She was getting her hair done at home and was sitting down and she felt like she was falling back like she would pass out.  She did not lose consciousness.  This happened once yesterday about 1200.  She had been feeling well that morning. He did not have anything in the ER that made her feel better.  She has overactive bladder and voids frequently.  She self-caths twice a day at home; she is able to urinate on her own but she feels unable to completely empty her bladder.  She is scheduled for a VCUG on 10/5.  Recent UTI with E coli resistant to Bactrim; treated with Macrobid and then she came to the ER and it was changed to Keflex, which she is still taking.  She has pressure on her periumbilical region for the last few days.  No n/v/d.  She has normal BMs with Miralax qhs.  No fevers.  She has chronic issues with her sinuses and takes something for it, especially in the spring and fall.  ED Course: [facility] transfer, per Dr. Loney Loh:  73 year old female with a complicated medical history presenting after syncopal event while getting her hair done. She does self catheterizations at home, unclear reason. Found to have a UTI. Head CT without intracranial finding but showing evidence of acute sinusitis. Troponin 18, EKG nonspecific. No COVID symptoms.  Review of Systems: As per HPI; otherwise review of systems reviewed and negative.   Ambulatory  Status:  Ambulates without assistance  Past Medical History:  Diagnosis Date  . Arthritis   . Bladder disorder   . CAD (coronary artery disease)    stents x 3, 2013, 2017  . Carotid arterial disease (HCC)   . High cholesterol   . Hypertension   . Rheumatoid arteritis (HCC)   . Rheumatoid arthritis [facility])     Past Surgical History:  Procedure Laterality Date  . CAROTID ENDARTERECTOMY    . CORONARY ANGIOPLASTY WITH STENT PLACEMENT      Social History   Socioeconomic History  . Marital status: Married    Spouse name: Not on file  . Number of children: Not on file  . Years of education: Not on file  . Highest education level: Not on file  Occupational History  . Not on file  Social Needs  . Financial resource strain: Not on file  . Food insecurity    Worry: Not on file    Inability: Not on file  . Transportation needs    Medical: Not on file    Non-medical: Not on file  Tobacco Use  . Smoking status: Former Smoker    Packs/day: 0.50    Years: 25.00    Pack years: 12.50    Types: Cigarettes    Quit date: 2000    Years since quitting: 20.7  . Smokeless tobacco: Never Used  Substance and Sexual Activity  . Alcohol use: No    Alcohol/week: 0.0 standard drinks  . Drug use:  No  . Sexual activity: Not on file  Lifestyle  . Physical activity    Days per week: Not on file    Minutes per session: Not on file  . Stress: Not on file  Relationships  . Social Musicianconnections    Talks on phone: Not on file    Gets together: Not on file    Attends religious service: Not on file    Active member of club or organization: Not on file    Attends meetings of clubs or organizations: Not on file    Relationship status: Not on file  . Intimate partner violence    Fear of current or ex partner: Not on file    Emotionally abused: Not on file    Physically abused: Not on file    Forced sexual activity: Not on file  Other Topics Concern  . Not on file  Social History Narrative  .  Not on file    Allergies  Allergen Reactions  . Nickel Rash  . Latex   . Lamictal [Lamotrigine] Rash    Family History  Problem Relation Age of Onset  . Heart disease Mother   . Heart disease Father     Prior to Admission medications   Medication Sig Start Date End Date Taking? Authorizing Provider  amLODipine-valsartan (EXFORGE) 5-320 MG tablet Take 0.5 tablets by mouth daily.  01/08/16 01/17/18  [provider]  aspirin 81 MG tablet Take 81 mg by mouth at bedtime.     [provider]  busPIRone (BUSPAR) 15 MG tablet Take 15 mg by mouth 3 (three) times daily.  08/01/14   [provider]  cephALEXin (KEFLEX) 500 MG capsule Take 1 capsule (500 mg total) by mouth 2 (two) times daily. 01/19/19   Palumbo, April, MD  cephALEXin (KEFLEX) 500 MG capsule Take 1 capsule (500 mg total) by mouth 2 (two) times daily for 7 days. 02/21/19 02/28/19  Tegeler, Canary Brimhristopher J, MD  clopidogrel (PLAVIX) 75 MG tablet Take 75 mg by mouth daily.    [provider]  ezetimibe (ZETIA) 10 MG tablet Take 10 mg by mouth at bedtime.     [provider]  gabapentin (NEURONTIN) 800 MG tablet TAKE 1 TABLET BY MOUTH THREE TIMES DAILY 01/30/19   Sater, Pearletha Furlichard A, MD  hydrochlorothiazide (HYDRODIURIL) 25 MG tablet Take 1 tablet (25 mg total) by mouth daily. 07/14/15 04/02/16  Alvira MondaySchlossman, Erin, MD  meloxicam (MOBIC) 7.5 MG tablet Take 1 tablet (7.5 mg total) by mouth daily. 12/14/17   Sater, Pearletha Furlichard A, MD  NITROSTAT 0.4 MG SL tablet Place 0.4 mg under the tongue every 5 (five) minutes as needed for chest pain.  07/08/14   [provider]  oxybutynin (DITROPAN) 5 MG tablet Take 1 tablet (5 mg total) by mouth 2 (two) times daily. 01/11/19   Vanetta MuldersZackowski, Scott, MD  phenazopyridine (PYRIDIUM) 200 MG tablet Take 1 tablet (200 mg total) by mouth 3 (three) times daily. 01/19/19   Palumbo, April, MD  pravastatin (PRAVACHOL) 40 MG tablet Take 40 mg by mouth daily.    [provider]   solifenacin (VESICARE) 10 MG tablet Take 10 mg by mouth daily.    [provider]  tamsulosin (FLOMAX) 0.4 MG CAPS capsule Take 1 capsule (0.4 mg total) by mouth daily. 01/19/19   Palumbo, April, MD  trospium (SANCTURA) 20 MG tablet Take 20 mg by mouth at bedtime.    [provider]    Physical Exam: Vitals:  02/25/19 0500 02/25/19 0530 02/25/19 0652 02/25/19 0800  BP: 99/66 101/68 138/87 97/66  Pulse: 97 99 99 99  Resp: 18 18 18 19   Temp:   98.6 F (37 C) 98.3 F (36.8 C)  TempSrc:   Oral Oral  SpO2: 98% 100% 100% 100%  Weight:      Height:         . General:  Appears calm and comfortable and is NAD . Eyes:  PERRL, EOMI, normal lids, iris . ENT:  grossly normal hearing, lips & tongue, mmm . Neck:  no LAD, masses or thyromegaly . Cardiovascular:  RRR, no m/r/g. No LE edema.  Respiratory:   CTA bilaterally with no wheezes/rales/rhonchi.  Normal respiratory effort. . Abdomen:  soft, mild periumbilical and suprapubic TTP, ND, NABS . Skin:  no rash or induration seen on limited exam . Musculoskeletal:  grossly normal tone BUE/BLE, good ROM, no bony abnormality . Psychiatric:  grossly normal mood and affect, speech fluent and appropriate, AOx3 . Neurologic:  CN 2-12 grossly intact, moves all extremities in coordinated fashion, sensation intact    Radiological Exams on Admission: Ct Head Wo Contrast  Result Date: 02/24/2019 CLINICAL DATA:  Syncope EXAM: CT HEAD WITHOUT CONTRAST TECHNIQUE: Contiguous axial images were obtained from the base of the skull through the vertex without intravenous contrast. COMPARISON:  10/28/2011 FINDINGS: Brain: Mild age related volume loss. No acute intracranial abnormality. Specifically, no hemorrhage, hydrocephalus, mass lesion, acute infarction, or significant intracranial injury. Vascular: No hyperdense vessel or unexpected calcification. Skull: No acute calvarial abnormality. Sinuses/Orbits: Mucosal thickening and air-fluid level  in the right maxillary sinus. Other: None IMPRESSION: No acute intracranial abnormality. Acute on chronic right maxillary sinusitis. Electronically Signed   By: 10/30/2011 M.D.   On: 02/24/2019 23:45    EKG: Independently reviewed.  NSR with rate 84; LVH;  no evidence of acute ischemia   Labs on Admission: I have personally reviewed the available labs and imaging studies at the time of the admission.  Pertinent labs:   Normal BMP HS troponin 18, 21 WBC 8.5 Hgb 10.3 UA: trace LE, rare bacteria Urine culture is pending COVID negative today and on 9/2 9/17 [facility] urine culture + for ESBL E coli sensitive to Augmentin; Ertapenem; Gentamicin; Meropenem; Nitrofurantoin; Zosyn   Assessment/Plan Principal Problem:   UTI due to extended-spectrum beta lactamase (ESBL) producing Escherichia coli Active Problems:   Chronic rheumatic arthritis (HCC)   Benign essential HTN   Carotid artery narrowing   HLD (hyperlipidemia)   Near syncope   Urinary retention with incomplete bladder emptying   UTI -Patient with 2 months of urinary retention requiring self-caths -Urologic evaluation is ongoing with plan for VCUG on 10/5, but she appears to have some kind of bladder outlet obstruction -She was seen at [facility] on 9/17 and urine culture grew ESBL E coli for which she was treated with Nitrofurantoin -Repeat UA is unremarkable but this may be associated with partial treatment of her UTI; culture is pending -Will broaden treatment with Zosyn at this time, in case this is related to her episode yesterday (see below) -Will admit for now with IVF -She has a number of bladder medications listed on her MAR (oxybutynin, Pyridium, Vesicare, Sanctura, and Flomax) but it is not yet updated; her most recent urology note indicates that Flomax was started so for now will continue Flomax, Pyridium, and Oxybutynin   Near syncope -She has a h/o carotid disease (see below) and yesterday was sitting when she  became  pre-syncopal -Etiology is not clear, but could be related to her recent UTI and ongoing abdominal discomfort -Will monitor on telemetry -Orthostatic vital signs now and in AM -Neuro checks  -PT eval and treat  Carotid disease -On ASA, Plavix and statin -R CEA in 11/2017 -B CIA and L EIA stents in 2003 -Also with B renal artery stents in 2003 -She was last seen by vascular in 4/20 and imaging showed mild L ICA stenosis and left vertebral artery with mid-systolic deceleration c/w pre-steal -At that time, she was noted to be due to repeat carotid duplex imaging for her 40-month CEA follow-up -Will order repeat carotid dopplers now in the setting of near syncope yesterday  RA -She was seen by Dr. Tamera Punt on 12/22/18; +RAF and CCP but no joint synovitis or deformity noted on exam -She was treated with remicade or Orencia prior -She declined methotrexate and DMARDs and so was started on a trial of sulfasalazine  HTN -Continue Exforge (CCB-ARB) -Hold HCTZ  HLD -She is on Pravachol and Zetia -Her LDL in 07/2018 was 86, which is elevated given her h/o ASCVD -Will change to Lipitor, hold Zetia     Note: This patient has been tested and is negative for the novel coronavirus COVID-19.  DVT prophylaxis:  Lovenox  Code Status:  Full - confirmed with patient Family Communication: None present  Disposition Plan:  Home once clinically improved Consults called: None  Admission status: Admit - It is my clinical opinion that admission to INPATIENT is reasonable and necessary because of the expectation that this patient will require hospital care that crosses at least 2 midnights to treat this condition based on the medical complexity of the problems presented.  Given the aforementioned information, the predictability of an adverse outcome is felt to be significant.     Karmen Bongo MD [facility]   How to contact the [facility] Attending or Consulting provider [place] or covering provider  during after hours [place], for this patient?  1. Check the care team in [facility] and look for a) attending/consulting [facility] provider listed and b) the [facility] team listed 2. Log into [email or web address] and use [place]'s universal password to access. If you do not have the password, please contact the hospital operator. 3. Locate the [facility] provider you are looking for under [facility] and page to a number that you can be directly reached. 4. If you still have difficulty reaching the provider, please page the [facility] (Director on Call) for the Hospitalists listed on amion for assistance.   02/25/2019, 10:59 AM

## 2019-02-25 NOTE — ED Notes (Signed)
[facility] notified (Sara Villanueva) - patient ready for transport 

## 2019-02-25 NOTE — ED Notes (Signed)
Pt and daughter have agreed that she will be admitted to [facility] that there were not any beds available at [facility] and it would be morning when she got a bed and we will notify her with the information Daughter more understanding at the present time

## 2019-02-25 NOTE — ED Notes (Signed)
Pt transferred to [facility] via [facility] 

## 2019-02-25 NOTE — ED Notes (Signed)
ED TO INPATIENT HANDOFF REPORT  ED Nurse Name and Phone #:  Misty Stanley [phone]  S Name/Age/Gender Sara Villanueva 73 y.o. female Room/Bed: MH08/MH08  Code Status   Code Status: Not on file  Home/SNF/Other Home Patient oriented to: self, place, time and situation Is this baseline? Yes   Triage Complete: Triage complete  Chief Complaint near syncope  Triage Note Pt was getting hair done this evening, felt fine until she went under dryer, witnessed syncope, LOC no fall noted, hx of same. Was disoriented for a few minutes after but is now a&Ox4. States no pain or complaints.    EMS vitals: 138/70, 153cbg, hr 90, 98% ra  116/70 sitting 101/66 standing   Allergies Allergies  Allergen Reactions  . Nickel Rash  . Latex   . Lamictal [Lamotrigine] Rash    Level of Care/Admitting Diagnosis ED Disposition    ED Disposition Condition Comment   Admit  Hospital Area: [facility] [[id number]]  Level of Care: Telemetry Medical [104]  I expect the patient will be discharged within 24 hours: No (not a candidate for 5C-Observation unit)  Covid Evaluation: Asymptomatic Screening Protocol (No Symptoms)  Diagnosis: Syncope [206001]  Admitting Physician: John Giovanni [[id number]]  Attending Physician: John Giovanni [[id number]]  PT Class (Do Not Modify): Observation [104]  PT Acc Code (Do Not Modify): Observation [10022]       B Medical/Surgery History Past Medical History:  Diagnosis Date  . Arthritis   . Bladder disorder   . Carotid arterial disease (HCC)   . High cholesterol   . Hypertension   . Rheumatoid arteritis (HCC)   . Rheumatoid arthritis [facility])    Past Surgical History:  Procedure Laterality Date  . CAROTID ENDARTERECTOMY    . CORONARY ANGIOPLASTY WITH STENT PLACEMENT       A IV Location/Drains/Wounds Patient Lines/Drains/Airways Status   Active Line/Drains/Airways    Name:   Placement date:   Placement time:   Site:   Days:   Peripheral  IV 02/24/19 Left Forearm   02/24/19    2325    Forearm   1   External Urinary Catheter   02/25/19    0046    -   less than 1          Intake/Output Last 24 hours  Intake/Output Summary (Last 24 hours) at 02/25/2019 0549 Last data filed at 02/25/2019 0303 Gross per 24 hour  Intake 500 ml  Output 900 ml  Net -400 ml    Labs/Imaging Results for orders placed or performed during the hospital encounter of 02/24/19 (from the past 48 hour(s))  CBC with Differential/Platelet     Status: Abnormal   Collection Time: 02/24/19 11:19 PM  Result Value Ref Range   WBC 8.5 4.0 - 10.5 K/uL   RBC 3.78 (L) 3.87 - 5.11 MIL/uL   Hemoglobin 10.3 (L) 12.0 - 15.0 g/dL   HCT 37.6 (L) 28.3 - 15.1 %   MCV 91.5 80.0 - 100.0 fL   MCH 27.2 26.0 - 34.0 pg   MCHC 29.8 (L) 30.0 - 36.0 g/dL   RDW 76.1 (H) 60.7 - 37.1 %   Platelets 235 150 - 400 K/uL   nRBC 0.0 0.0 - 0.2 %   Neutrophils Relative % 73 %   Neutro Abs 6.2 1.7 - 7.7 K/uL   Lymphocytes Relative 14 %   Lymphs Abs 1.2 0.7 - 4.0 K/uL   Monocytes Relative 12 %   Monocytes Absolute 1.0 0.1 - 1.0  K/uL   Eosinophils Relative 1 %   Eosinophils Absolute 0.1 0.0 - 0.5 K/uL   Basophils Relative 0 %   Basophils Absolute 0.0 0.0 - 0.1 K/uL   Immature Granulocytes 0 %   Abs Immature Granulocytes 0.01 0.00 - 0.07 K/uL    Comment: Performed at [facility], [address] [place], [place] [address]  Basic metabolic panel     Status: None   Collection Time: 02/24/19 11:19 PM  Result Value Ref Range   Sodium 137 135 - 145 mmol/L   Potassium 4.1 3.5 - 5.1 mmol/L    Comment: SLIGHT HEMOLYSIS   Chloride 105 98 - 111 mmol/L   CO2 23 22 - 32 mmol/L   Glucose, Bld 95 70 - 99 mg/dL   BUN 17 8 - 23 mg/dL   Creatinine, Ser 7.62 0.44 - 1.00 mg/dL   Calcium 9.1 8.9 - 26.3 mg/dL   GFR calc non Af Amer >60 >60 mL/min   GFR calc Af Amer >60 >60 mL/min   Anion gap 9 5 - 15    Comment: Performed at [facility], [address] [place], [place] [address]  Urinalysis, Routine w reflex microscopic     Status: Abnormal   Collection Time: 02/24/19 11:19 PM  Result Value Ref Range   Color, Urine YELLOW YELLOW   APPearance HAZY (A) CLEAR   Specific Gravity, Urine 1.010 1.005 - 1.030   pH 5.5 5.0 - 8.0   Glucose, UA NEGATIVE NEGATIVE mg/dL   Hgb urine dipstick NEGATIVE NEGATIVE   Bilirubin Urine NEGATIVE NEGATIVE   Ketones, ur NEGATIVE NEGATIVE mg/dL   Protein, ur NEGATIVE NEGATIVE mg/dL   Nitrite NEGATIVE NEGATIVE   Leukocytes,Ua TRACE (A) NEGATIVE    Comment: Performed at [facility], [address] [place], [place] [address]  Troponin I (High Sensitivity)     Status: Abnormal   Collection Time: 02/24/19 11:19 PM  Result Value Ref Range   Troponin I (High Sensitivity) 18 (H) <18 ng/L    Comment: (NOTE) Elevated high sensitivity troponin I (hsTnI) values and significant  changes across serial measurements may suggest ACS but many other  chronic and acute conditions are known to elevate hsTnI results.  Refer to the "Links" section for chest pain algorithms and additional  guidance. Performed at [facility], [address] [place], [place] [address]   Urinalysis, Microscopic (reflex)     Status: Abnormal   Collection Time: 02/24/19 11:19 PM  Result Value Ref Range   RBC / HPF 0-5 0 - 5 RBC/hpf   WBC, UA 11-20 0 - 5 WBC/hpf   Bacteria, UA RARE (A) NONE SEEN   Squamous Epithelial / LPF 6-10 0 - 5   Non Squamous Epithelial PRESENT (A) NONE SEEN   WBC Clumps PRESENT     Comment: Performed at [facility], [address] [place], [place] [address]  SARS CORONAVIRUS 2 (TAT 6-24 HRS) Nasopharyngeal Nasopharyngeal Swab     Status: None   Collection Time: 02/25/19 12:13 AM   Specimen: Nasopharyngeal Swab  Result Value Ref Range   SARS Coronavirus 2 NEGATIVE NEGATIVE    Comment: (NOTE) SARS-CoV-2 target nucleic acids are NOT DETECTED. The SARS-CoV-2 RNA is generally detectable in upper  and lower respiratory specimens during the acute phase of infection. Negative results do not preclude SARS-CoV-2 infection, do not rule out co-infections with other pathogens, and should not be  used as the sole basis for treatment or other patient management decisions. Negative results must be combined with clinical observations, patient history, and epidemiological information. The expected result is Negative. Fact Sheet for Patients: SugarRoll.be Fact Sheet for Healthcare Providers: https://www.woods-mathews.com/ This test is not yet approved or cleared by the [place] FDA and  has been authorized for detection and/or diagnosis of SARS-CoV-2 by FDA under an Emergency Use Authorization (EUA). This EUA will remain  in effect (meaning this test can be used) for the duration of the COVID-19 declaration under Section 56 4(b)(1) of the Act, 21 U.S.C. section 360bbb-3(b)(1), unless the authorization is terminated or revoked sooner. Performed at [facility], [place] [address], [place], [place] [address]   Troponin I (High Sensitivity)     Status: Abnormal   Collection Time: 02/25/19  1:18 AM  Result Value Ref Range   Troponin I (High Sensitivity) 21 (H) <18 ng/L    Comment: (NOTE) Elevated high sensitivity troponin I (hsTnI) values and significant  changes across serial measurements may suggest ACS but many other  chronic and acute conditions are known to elevate hsTnI results.  Refer to the "Links" section for chest pain algorithms and additional  guidance. Performed at [facility], [address] [place], [place] [address]    Ct Head Wo Contrast  Result Date: 02/24/2019 CLINICAL DATA:  Syncope EXAM: CT HEAD WITHOUT CONTRAST TECHNIQUE: Contiguous axial images were obtained from the base of the skull through the vertex without intravenous contrast. COMPARISON:  10/28/2011 FINDINGS: Brain: Mild age related volume loss. No  acute intracranial abnormality. Specifically, no hemorrhage, hydrocephalus, mass lesion, acute infarction, or significant intracranial injury. Vascular: No hyperdense vessel or unexpected calcification. Skull: No acute calvarial abnormality. Sinuses/Orbits: Mucosal thickening and air-fluid level in the right maxillary sinus. Other: None IMPRESSION: No acute intracranial abnormality. Acute on chronic right maxillary sinusitis. Electronically Signed   By: Rolm Baptise M.D.   On: 02/24/2019 23:45    Pending Labs Unresulted Labs (From admission, onward)    Start     Ordered   02/25/19 0000  Urine culture  ONCE - STAT,   STAT    Question:  Patient immune status  Answer:  Normal   02/24/19 2359          Vitals/Pain Today's Vitals   02/25/19 0300 02/25/19 0330 02/25/19 0400 02/25/19 0500  BP: 125/78 124/66 94/61 99/66   Pulse: (!) 104 (!) 106 100 97  Resp: 19 (!) 21 19 18   Temp:      SpO2: 100% 100% 99% 98%  Weight:      Height:      PainSc:        Isolation Precautions No active isolations  Medications Medications  sodium chloride 0.9 % bolus 500 mL (0 mLs Intravenous Stopped 02/25/19 0046)  cephALEXin (KEFLEX) capsule 500 mg (500 mg Oral Given 02/25/19 0004)    Mobility walks     Focused Assessments Cardiac Assessment Handoff:    Lab Results  Component Value Date   TROPONINI <0.03 02/05/2018   Lab Results  Component Value Date   DDIMER 2.49 (H) 09/20/2012   Does the Patient currently have chest pain? No     R Recommendations: See Admitting Provider Note  Report given to:   Additional Notes:

## 2019-02-25 NOTE — Progress Notes (Signed)
Pharmacy Antibiotic Note  Sara Villanueva is a 73 y.o. female admitted on 02/24/2019 with ESBL UTI found on 9/17 at [facility].  Pharmacy has been consulted for Zosyn dosing. Urinary symptoms include feeling of incomplete urination. WBC wnl. Afebrile.   Per Care Everywhere, patient was prescribed Bactrim on 9/17 for E. Coli UTI. On 9/18 antibiotic regimen changed to [facility] for planned 30 days after culture found to be resistant to Bactrim. Unable to find culture results in Care Everywhere and unable to call the urology clinic for microbiology results because clinic is closed.   Plan: Zosyn 3.375g IV q8h (4 hour infusion).  Follow up urine culture sensitivities from 9/17  Monitor cultures/sensitivites and clinical progression   Height: 5\' 8"  (172.7 cm) Weight: 145 lb (65.8 kg) IBW/kg (Calculated) : 63.9  Temp (24hrs), Avg:98.6 F (37 C), Min:98.5 F (36.9 C), Max:98.6 F (37 C)  Recent Labs  Lab 02/24/19 2319  WBC 8.5  CREATININE 0.73    Estimated Creatinine Clearance: 62.2 mL/min (by C-G formula based on SCr of 0.73 mg/dL).    Allergies  Allergen Reactions  . Nickel Rash  . Latex   . Lamictal [Lamotrigine] Rash    Antimicrobials this admission: Zosyn 9/26 >>  Cephalexin 500mg  x1   Microbiology results: 9/25 UCx: sent    Thank you for allowing pharmacy to be a part of this patient's care. Cristela Felt, PharmD PGY1 Pharmacy Resident [employer]: [phone]   02/25/2019 9:07 AM

## 2019-02-25 NOTE — ED Notes (Signed)
Pt states her daughter took her glasses home when she left.

## 2019-02-25 NOTE — Progress Notes (Signed)
   02/25/19 0652  Vitals  Temp 98.6 F (37 C)  Temp Source Oral  BP 138/87  MAP (mmHg) 103  BP Location Left Arm  BP Method Automatic  Patient Position (if appropriate) Lying  Pulse Rate 99  Pulse Rate Source Monitor  Resp 18  Oxygen Therapy  SpO2 100 %  O2 Device Room Air  Pain Assessment  Pain Scale 0-10  Pain Score 0  Glasgow Coma Scale  Eye Opening 4  Best Verbal Response (NON-intubated) 5  Best Motor Response 6  Glasgow Coma Scale Score 15  MEWS Score  MEWS RR 0  MEWS Pulse 0  MEWS Systolic 0  MEWS LOC 0  MEWS Temp 0  MEWS Score 0  MEWS Score Color Green  Sara Villanueva is a 73 y.o. female patient admitted from [facility] at [phone] via [place].  She is awake, alert - oriented  X 4 - no acute distress noted.  VSS - Blood pressure 138/87, pulse 99, temperature 98.6 F (37 C), temperature source Oral, resp. rate 18, SpO2 100 %.    IV in place, occlusive dsg intact without redness.  Orientation to room, and floor completed with information packet given to patient/family.  Patient declined safety video at this time.  Admission INP armband ID verified with patient, and in place.   SR up x 2, patient verbalized understanding to call nsg before up out of bed.  Call light within reach, patient able to voice, and demonstrate understanding.  Skin, clean-dry- intact without evidence of bruising, or skin tears.   No evidence of skin break down noted on exam.  Patient wet up on Telemetry and CCMD has been notified.  Walked patient to bathroom to void.  She became tachycardic in the 120s with the walk.  Paged [facility] admissions and let them know patient has arrived.  Report given to oncoming RN.       Will cont to eval and treat per MD orders.  Damita Dunnings, RN 02/25/2019 7:43 AM

## 2019-02-26 ENCOUNTER — Inpatient Hospital Stay (HOSPITAL_COMMUNITY): Payer: Medicare Other

## 2019-02-26 DIAGNOSIS — R55 Syncope and collapse: Secondary | ICD-10-CM

## 2019-02-26 LAB — CBC
HCT: 31.5 % — ABNORMAL LOW (ref 36.0–46.0)
Hemoglobin: 9.8 g/dL — ABNORMAL LOW (ref 12.0–15.0)
MCH: 28.1 pg (ref 26.0–34.0)
MCHC: 31.1 g/dL (ref 30.0–36.0)
MCV: 90.3 fL (ref 80.0–100.0)
Platelets: 219 10*3/uL (ref 150–400)
RBC: 3.49 MIL/uL — ABNORMAL LOW (ref 3.87–5.11)
RDW: 17.5 % — ABNORMAL HIGH (ref 11.5–15.5)
WBC: 5.4 10*3/uL (ref 4.0–10.5)
nRBC: 0 % (ref 0.0–0.2)

## 2019-02-26 LAB — BASIC METABOLIC PANEL
Anion gap: 7 (ref 5–15)
BUN: 8 mg/dL (ref 8–23)
CO2: 22 mmol/L (ref 22–32)
Calcium: 8.7 mg/dL — ABNORMAL LOW (ref 8.9–10.3)
Chloride: 111 mmol/L (ref 98–111)
Creatinine, Ser: 0.79 mg/dL (ref 0.44–1.00)
GFR calc Af Amer: >60 mL/min (ref >60)
GFR calc non Af Amer: >60 mL/min (ref >60)
Glucose, Bld: 100 mg/dL — ABNORMAL HIGH (ref 70–99)
Potassium: 3.8 mmol/L (ref 3.5–5.1)
Sodium: 140 mmol/L (ref 135–145)

## 2019-02-26 LAB — URINE CULTURE
Culture: NO GROWTH
Special Requests: NORMAL

## 2019-02-26 MED ORDER — LIP MEDEX EX OINT: TOPICAL_OINTMENT | CUTANEOUS | Status: DC | PRN

## 2019-02-26 NOTE — Progress Notes (Signed)
Orthostatic vital signs  Lying: BP 118/66 HR 95 Sitting: BP 113/70 HR 92 Standing: BP 102/70 HR 102

## 2019-02-26 NOTE — Progress Notes (Signed)
PROGRESS NOTE     Sara Villanueva  WUJ:[id number] DOB: 1945/09/21 DOA: 02/24/2019 PCP: Truett PernaWang, Yun, MD   Brief Narrative:  Patient is a 73 year old female with history of rheumatoid arthritis, hypertension, hyperlipidemia, PAD,coronary disease status post stenting, urinary retention with bladder outlet obstruction requiring self cath for last 2 months, carotid artery disease status post right endarterectomy who presented with near syncope from home.  She did not lost consciousness.  Assessment & Plan:   Principal Problem:   UTI due to extended-spectrum beta lactamase (ESBL) producing Escherichia coli Active Problems:   Chronic rheumatic arthritis (HCC)   Benign essential HTN   Carotid artery narrowing   HLD (hyperlipidemia)   Near syncope   Urinary retention with incomplete bladder emptying   Near syncope: History of carotid artery disease bilaterally.  Recent history of right-sided endarterectomy.  Follows with vascular surgery.   carotid Dopplers done here did not show significant stenosis bilaterally.  Will check echocardiogram.  Orthostatic vitals done this morning was normal.  She was seen by PT, no follow-up recommended.  Suspected urinary tract infection: She has recent history of urinary tract infection with E. coli resistant to Bactrim.  She was taking Keflex at present.  She mentioned that she felt pressure in her periumbilical region for last few days.  Urine culture has been sent.  Follow-up urine culture.  She has been started on Zosyn here which we will continue for now.  Overactive bladder: Follows with urology at [facility].  Plan for [facility] on October 5.  Self catheterizes twice a day at home since last 2 months.  Carotid artery disease: On aspirin, Plavix and statin.  Right CEA was done in 11/2017.  Follows with vascular surgery.  She also has bilateral renal artery stents in 2013.  Rheumatoid arthritis: Follows with rheumatology, Dr. Fredderick PhenixBelfi.  She is on sulfasalazine.   Hypertension: Continue current medicines.  Blood pressure stable.  Hydrochlorothiazide on hold.  Hyperlipidemia: She was on Pravachol and Zetia at home.  Changed to Lipitor due to her history of carotid artery disease.         DVT prophylaxis:Lovenox Code Status: Full Family Communication: None present at the bedside Disposition Plan: Likely home tomorrow   Consultants: None  Procedures: None  Antimicrobials:  Anti-infectives (From admission, onward)   Start     Dose/Rate Route Frequency Ordered Stop   02/25/19 1000  piperacillin-tazobactam (ZOSYN) IVPB 3.375 g     3.375 g 12.5 mL/hr over 240 Minutes Intravenous Every 8 hours 02/25/19 0921     02/25/19 0000  cephALEXin (KEFLEX) capsule 500 mg     500 mg Oral  Once 02/24/19 2358 02/25/19 0004      Subjective:  Patient seen and examined at bedside this morning.  Currently hemodynamically stable.  Denies any lightheadedness or dizziness.  Denies any new complaints.  She is waiting for echocardiogram, carotid Doppler, her urine culture is still pending.  Discussed at the bedside about the discharge plan tomorrow after getting those results.  Objective: Vitals:   02/26/19 0500 02/26/19 0800 02/26/19 0936 02/26/19 0939  BP:  118/66 113/70 102/70  Pulse:  95 92 (!) 102  Resp:  17 19 18   Temp:  98 F (36.7 C) 98.6 F (37 C) 97.9 F (36.6 C)  TempSrc:  Oral Oral Oral  SpO2:   100% 100%  Weight: 66.8 kg     Height:        Intake/Output Summary (Last 24 hours) at 02/26/2019 1344 Last data filed  at 02/26/2019 1000 Gross per 24 hour  Intake 1844.23 ml  Output 2500 ml  Net -655.77 ml   Filed Weights   02/24/19 2045 02/26/19 0500  Weight: 65.8 kg 66.8 kg    Examination:  General exam: Appears calm and comfortable ,Not in distress,pleasant elderly female  HEENT:PERRL,Oral mucosa moist, Ear/Nose normal on gross exam Respiratory system: Bilateral equal air entry, normal vesicular breath sounds, no wheezes or crackles   Cardiovascular system: S1 & S2 heard, RRR. No JVD, murmurs, rubs, gallops or clicks. No pedal edema. Gastrointestinal system: Abdomen is nondistended, soft and nontender. No organomegaly or masses felt. Normal bowel sounds heard. Central nervous system: Alert and oriented. No focal neurological deficits. Extremities: No edema, no clubbing ,no cyanosis, distal peripheral pulses palpable. Skin: No rashes, lesions or ulcers,no icterus ,no pallor MSK: Normal muscle bulk,tone ,power Psychiatry: Judgement and insight appear normal. Mood & affect appropriate.     Data Reviewed: I have personally reviewed following labs and imaging studies  CBC: Recent Labs  Lab 02/24/19 2319 02/26/19 0237  WBC 8.5 5.4  NEUTROABS 6.2  --   HGB 10.3* 9.8*  HCT 34.6* 31.5*  MCV 91.5 90.3  PLT 235 219   Basic Metabolic Panel: Recent Labs  Lab 02/24/19 2319 02/26/19 0237  NA 137 140  K 4.1 3.8  CL 105 111  CO2 23 22  GLUCOSE 95 100*  BUN 17 8  CREATININE 0.73 0.79  CALCIUM 9.1 8.7*   GFR: Estimated Creatinine Clearance: 62.2 mL/min (by C-G formula based on SCr of 0.79 mg/dL). Liver Function Tests: No results for input(s): AST, ALT, ALKPHOS, BILITOT, PROT, ALBUMIN in the last 168 hours. No results for input(s): LIPASE, AMYLASE in the last 168 hours. No results for input(s): AMMONIA in the last 168 hours. Coagulation Profile: No results for input(s): INR, PROTIME in the last 168 hours. Cardiac Enzymes: No results for input(s): CKTOTAL, CKMB, CKMBINDEX, TROPONINI in the last 168 hours. BNP (last 3 results) No results for input(s): PROBNP in the last 8760 hours. HbA1C: No results for input(s): HGBA1C in the last 72 hours. CBG: No results for input(s): GLUCAP in the last 168 hours. Lipid Profile: No results for input(s): CHOL, HDL, LDLCALC, TRIG, CHOLHDL, LDLDIRECT in the last 72 hours. Thyroid Function Tests: Recent Labs    02/25/19 0958  TSH 0.780   Anemia Panel: No results for  input(s): VITAMINB12, FOLATE, FERRITIN, TIBC, IRON, RETICCTPCT in the last 72 hours. Sepsis Labs: No results for input(s): PROCALCITON, LATICACIDVEN in the last 168 hours.  Recent Results (from the past 240 hour(s))  Urine culture     Status: None   Collection Time: 02/21/19 12:43 AM   Specimen: Urine, Random  Result Value Ref Range Status   Specimen Description   Final    URINE, RANDOM Performed at [facility], [address] [place], [place] [address]    Special Requests   Final    NONE Performed at [facility], [address] [place], [place] [address]    Culture   Final    NO GROWTH Performed at [facility], [address], [place], [place] [address]    Report Status 02/22/2019 FINAL  Final  SARS CORONAVIRUS 2 (TAT 6-24 HRS) Nasopharyngeal Nasopharyngeal Swab     Status: None   Collection Time: 02/25/19 12:13 AM   Specimen: Nasopharyngeal Swab  Result Value Ref Range Status   SARS Coronavirus 2 NEGATIVE NEGATIVE Final  Comment: (NOTE) SARS-CoV-2 target nucleic acids are NOT DETECTED. The SARS-CoV-2 RNA is generally detectable in upper and lower respiratory specimens during the acute phase of infection. Negative results do not preclude SARS-CoV-2 infection, do not rule out co-infections with other pathogens, and should not be used as the sole basis for treatment or other patient management decisions. Negative results must be combined with clinical observations, patient history, and epidemiological information. The expected result is Negative. Fact Sheet for Patients: SugarRoll.be Fact Sheet for Healthcare Providers: https://www.woods-mathews.com/ This test is not yet approved or cleared by the [place] FDA and  has been authorized for detection and/or diagnosis of SARS-CoV-2 by FDA under an Emergency Use Authorization (EUA). This EUA will remain  in effect (meaning this test can be used) for  the duration of the COVID-19 declaration under Section 56 4(b)(1) of the Act, 21 U.S.C. section 360bbb-3(b)(1), unless the authorization is terminated or revoked sooner. Performed at [facility], [address], [place], [place] [address]          Radiology Studies: Ct Head Wo Contrast  Result Date: 02/24/2019 CLINICAL DATA:  Syncope EXAM: CT HEAD WITHOUT CONTRAST TECHNIQUE: Contiguous axial images were obtained from the base of the skull through the vertex without intravenous contrast. COMPARISON:  10/28/2011 FINDINGS: Brain: Mild age related volume loss. No acute intracranial abnormality. Specifically, no hemorrhage, hydrocephalus, mass lesion, acute infarction, or significant intracranial injury. Vascular: No hyperdense vessel or unexpected calcification. Skull: No acute calvarial abnormality. Sinuses/Orbits: Mucosal thickening and air-fluid level in the right maxillary sinus. Other: None IMPRESSION: No acute intracranial abnormality. Acute on chronic right maxillary sinusitis. Electronically Signed   By: Rolm Baptise M.D.   On: 02/24/2019 23:45   Vas US Carotid  Result Date: 02/26/2019 Carotid Arterial Duplex Study Indications:       Syncope and right endarterectomy 11/2017 at [facility] Risk Factors:      Hypertension, hyperlipidemia, coronary artery disease, PAD. Other Factors:     History of bilateral common iliac arterial stents, left                    external iliac stent, bilateral. Comparison Study:  No prior study on file at [facility] Technologist: Sharion Dove RVS  Examination Guidelines: A complete evaluation includes B-mode imaging, spectral Doppler, color Doppler, and power Doppler as needed of all accessible portions of each vessel. Bilateral testing is considered an integral part of a complete examination. Limited examinations for reoccurring indications may be performed as noted.  Right Carotid Findings:  +----------+--------+--------+--------+------------------+------------------+           PSV cm/sEDV cm/sStenosisPlaque DescriptionComments           +----------+--------+--------+--------+------------------+------------------+ CCA Prox  168     19                                intimal thickening +----------+--------+--------+--------+------------------+------------------+ CCA Distal113     20                                intimal thickening +----------+--------+--------+--------+------------------+------------------+ ICA Prox  164     23              heterogenous      CEA patent         +----------+--------+--------+--------+------------------+------------------+ ICA Distal119     22                                                   +----------+--------+--------+--------+------------------+------------------+  ECA       132     23                                                   +----------+--------+--------+--------+------------------+------------------+ +----------+--------+-------+--------+-------------------+           PSV cm/sEDV cmsDescribeArm Pressure (mmHG) +----------+--------+-------+--------+-------------------+ Subclavian301                                        +----------+--------+-------+--------+-------------------+ +---------+--------+--+--------+--+ VertebralPSV cm/s66EDV cm/s14 +---------+--------+--+--------+--+  Left Carotid Findings: +----------+--------+--------+--------+------------------+------------------+           PSV cm/sEDV cm/sStenosisPlaque DescriptionComments           +----------+--------+--------+--------+------------------+------------------+ CCA Prox  120     12                                intimal thickening +----------+--------+--------+--------+------------------+------------------+ CCA Distal108     23                                intimal thickening  +----------+--------+--------+--------+------------------+------------------+ ICA Prox  102     33      1-39%   calcific          Shadowing          +----------+--------+--------+--------+------------------+------------------+ ICA Distal96      26                                                   +----------+--------+--------+--------+------------------+------------------+ ECA       101     12                                                   +----------+--------+--------+--------+------------------+------------------+ +----------+--------+--------+--------+-------------------+           PSV cm/sEDV cm/sDescribeArm Pressure (mmHG) +----------+--------+--------+--------+-------------------+ [id number]                                         +----------+--------+--------+--------+-------------------+ +---------+--------+--+--------+--+ VertebralPSV cm/s50EDV cm/s20 +---------+--------+--+--------+--+  Summary: Right Carotid: Velocities in the right ICA are consistent with a 1-39% stenosis.                Patent CEA. Left Carotid: Velocities in the left ICA are consistent with a 1-39% stenosis.  *See table(s) above for measurements and observations.     Preliminary         Scheduled Meds: . amLODipine  2.5 mg Oral Daily   And  . irbesartan  150 mg Oral Daily  . aspirin  81 mg Oral QHS  . atorvastatin  40 mg Oral q1800  . busPIRone  15 mg Oral TID  . clopidogrel  75 mg Oral Daily  . enoxaparin (LOVENOX) injection  40 mg Subcutaneous Q24H  . oxybutynin  5  mg Oral BID  . phenazopyridine  200 mg Oral TID  . sodium chloride flush  3 mL Intravenous Q12H  . tamsulosin  0.4 mg Oral Daily   Continuous Infusions: . lactated ringers 75 mL/hr at 02/26/19 1143  . piperacillin-tazobactam (ZOSYN)  IV 3.375 g (02/26/19 0557)     LOS: 1 day    Time spent: 35 mins.More than 50% of that time was spent in counseling and/or coordination of care.      Burnadette Pop, MD [facility] Pager [phone]  If 7PM-7AM, please contact night-coverage [email or web address] Password [email or web address] 02/26/2019, 1:44 PM

## 2019-02-27 ENCOUNTER — Inpatient Hospital Stay (HOSPITAL_COMMUNITY): Payer: Medicare Other

## 2019-02-27 DIAGNOSIS — I340 Nonrheumatic mitral (valve) insufficiency: Secondary | ICD-10-CM

## 2019-02-27 LAB — ECHOCARDIOGRAM COMPLETE
Height: 68 in
Weight: 2366.86 oz

## 2019-02-27 NOTE — Progress Notes (Signed)
Physical Therapy Treatment Patient Details Name: Sherman Lipuma MRN: [id number] DOB: 1946-02-22 Today's Date: 02/27/2019    History of Present Illness Evie Croston is a 73 y.o. female with medical history significant of RA; HTN; HLD; CAD s/p stent; urinary retention with bladder outlet obstruction requiring self-caths for the last 2 months; and carotid disease s/p endarterectomy presenting with syncope.    PT Comments    Patient received sitting on edge of bed, dressed in street clothes. RN present as patient pulled out IVs. Patient report she is feeling much better, no weakness. Patient performed transfers independently. Ambulated 250 feet with min guard, no difficulties, normal cadence. Patient planning to discharge home today. Will not need continued PT services. PT to sign off at this time.      Follow Up Recommendations  No PT follow up;Supervision - Intermittent     Equipment Recommendations  None recommended by PT    Recommendations for Other Services       Precautions / Restrictions Precautions Precautions: Fall Restrictions Weight Bearing Restrictions: No    Mobility  Bed Mobility Overal bed mobility: Independent             General bed mobility comments: Patient received dressed, sitting on side of bed, having pulled out both IVs. RN present.  Transfers Overall transfer level: Independent Equipment used: None Transfers: Sit to/from Stand Sit to Stand: Supervision            Ambulation/Gait Ambulation/Gait assistance: Min guard Gait Distance (Feet): 250 Feet Assistive device: None Gait Pattern/deviations: Step-through pattern Gait velocity: WNL Gait velocity interpretation: >2.62 ft/sec, indicative of community ambulatory General Gait Details: min guard for safety. No LOB or abnormalities noted.   Stairs             Wheelchair Mobility    Modified Rankin (Stroke Patients Only)       Balance Overall balance assessment: Mild  deficits observed, not formally tested                                          Cognition Arousal/Alertness: Awake/alert Behavior During Therapy: WFL for tasks assessed/performed Overall Cognitive Status: Within Functional Limits for tasks assessed                                        Exercises      General Comments        Pertinent Vitals/Pain Pain Assessment: No/denies pain    Home Living                      Prior Function            PT Goals (current goals can now be found in the care plan section) Acute Rehab PT Goals Patient Stated Goal: home today PT Goal Formulation: With patient Time For Goal Achievement: 03/11/19 Potential to Achieve Goals: Good Progress towards PT goals: Goals met and updated - see care plan    Frequency    Min 2X/week      PT Plan Current plan remains appropriate    Co-evaluation              AM-PAC PT "6 Clicks" Mobility   Outcome Measure  Help needed turning from your back to your side while in a flat  bed without using bedrails?: None Help needed moving from lying on your back to sitting on the side of a flat bed without using bedrails?: None Help needed moving to and from a bed to a chair (including a wheelchair)?: None Help needed standing up from a chair using your arms (e.g., wheelchair or bedside chair)?: None Help needed to walk in hospital room?: None Help needed climbing 3-5 steps with a railing? : None 6 Click Score: 24    End of Session Equipment Utilized During Treatment: Gait belt Activity Tolerance: Patient tolerated treatment well Patient left: with nursing/sitter in room;Other (comment)(seated on side of bed) Nurse Communication: Mobility status PT Visit Diagnosis: Unsteadiness on feet (R26.81)     Time: 7375-0510 PT Time Calculation (min) (ACUTE ONLY): 21 min  Charges:  $Gait Training: 8-22 mins                     Jeb Schloemer, PT,  [facility] 02/27/19,10:19 AM

## 2019-02-27 NOTE — Discharge Summary (Signed)
Physician Discharge Summary  Sara Villanueva NGE:[id number] DOB: Sep 10, 1945 DOA: 02/24/2019  PCP: Truett Perna, MD  Admit date: 02/24/2019 Discharge date: 02/27/2019  Admitted From: Home Disposition:  Home  Discharge Condition:Stable CODE STATUS:FULL Diet recommendation: Heart Healthy   Brief/Interim Summary: Patient is a 73 year old female with history of rheumatoid arthritis, hypertension, hyperlipidemia, PAD,coronary disease status post stenting, urinary retention with bladder outlet obstruction requiring self cath for last 2 months, carotid artery disease status post right endarterectomy who presented with near syncope from home.  She did not lost consciousness. Patient was admitted for syncopal work-up.  CT head did not show any acute intracranial abnormalities.  Her orthostatic vitals were negative.  She was seen by physical therapy and she did not need any follow-up.  Echocardiogram showed mildly reduced left ventricular systolic function with ejection fraction of 45 to 50%.  Her blood pressure remains soft.  We have recommended to discontinue all blood pressure medicines.  We recommend to follow-up with her cardiologist  as an outpatient at [facility] in 2 to 4 weeks.  Following problems were addressed during her hospitalization:\  Near syncope: History of carotid artery disease bilaterally.  Recent history of right-sided endarterectomy.  Follows with vascular surgery.   carotid Dopplers done here did not show significant stenosis bilaterally.   Orthostatic vitals done was normal.  She was seen by PT, no follow-up recommended.  Mildly reduced systolic function:Echocardiogram showed mildly reduced left ventricular systolic function with ejection fraction of 45 to 50%.   We recommend to follow-up with her cardiologist  as an outpatient at [facility] in 2 to 4 weeks.  HTN: Her blood pressure remains soft.  We have recommended to discontinue all blood pressure medicines.   Suspected urinary  tract infection: She has recent history of urinary tract infection with E. coli resistant to Bactrim.  She was taking Keflex at present.  She mentioned that she felt pressure in her periumbilical region for last few days.  Urine culture has been sent and it didn't show any growth.  Abx discontiued.  Urinary retention: Follows with urology at [facility].  Plan for [facility] on October 5.  Self catheterizes twice a day at home since last 2 months.  Carotid artery disease: On aspirin, Plavix and statin.  Right CEA was done in 11/2017.  Follows with vascular surgery.  She also has bilateral renal artery stents in 2013.  Rheumatoid arthritis: Follows with rheumatology, Dr. Fredderick Phenix.  She is on sulfasalazine.  Hyperlipidemia: She was on Pravachol and Zetia at home.  Resume home meds.   Discharge Diagnoses:  Principal Problem:   UTI due to extended-spectrum beta lactamase (ESBL) producing Escherichia coli Active Problems:   Chronic rheumatic arthritis (HCC)   Benign essential HTN   Carotid artery narrowing   HLD (hyperlipidemia)   Near syncope   Urinary retention with incomplete bladder emptying    Discharge Instructions  Discharge Instructions    Diet - low sodium heart healthy   Complete by: As directed    Discharge instructions   Complete by: As directed    1)Please follow up with your PCP in a week. 2)Please monitor your blood pressure at home.  We will discontinue all blood pressure medicines.  Your blood pressure is low to normal. 3)You will be called by cardiology office for follow-up appointment   Increase activity slowly   Complete by: As directed      Allergies as of 02/27/2019      Reactions   Nickel Rash  Latex    Lamictal [lamotrigine] Rash      Medication List    STOP taking these medications   amLODipine-valsartan 5-320 MG tablet Commonly known as: EXFORGE   cephALEXin 500 MG capsule Commonly known as: KEFLEX   hydrochlorothiazide 25 MG tablet Commonly  known as: HYDRODIURIL   oxybutynin 5 MG tablet Commonly known as: DITROPAN   phenazopyridine 200 MG tablet Commonly known as: PYRIDIUM     TAKE these medications   aspirin 81 MG tablet Take 81 mg by mouth at bedtime.   busPIRone 15 MG tablet Commonly known as: BUSPAR Take 15 mg by mouth 3 (three) times daily.   clopidogrel 75 MG tablet Commonly known as: PLAVIX Take 75 mg by mouth daily.   gabapentin 800 MG tablet Commonly known as: NEURONTIN TAKE 1 TABLET BY MOUTH THREE TIMES DAILY   meloxicam 7.5 MG tablet Commonly known as: Mobic Take 1 tablet (7.5 mg total) by mouth daily.   Nitrostat 0.4 MG SL tablet Generic drug: nitroGLYCERIN Place 0.4 mg under the tongue every 5 (five) minutes as needed for chest pain.   pravastatin 40 MG tablet Commonly known as: PRAVACHOL Take 40 mg by mouth daily.   solifenacin 10 MG tablet Commonly known as: VESICARE Take 10 mg by mouth daily.   tamsulosin 0.4 MG Caps capsule Commonly known as: Flomax Take 1 capsule (0.4 mg total) by mouth daily.   trospium 20 MG tablet Commonly known as: SANCTURA Take 20 mg by mouth at bedtime.   Zetia 10 MG tablet Generic drug: ezetimibe Take 10 mg by mouth at bedtime.      Follow-up Information    Rudene Anda, MD. Schedule an appointment as soon as possible for a visit in 1 week(s).   Specialty: Internal Medicine Contact information: [address] [place] [address] [phone]          Allergies  Allergen Reactions  . Nickel Rash  . Latex   . Lamictal [Lamotrigine] Rash    Consultations:  None   Procedures/Studies: Ct Head Wo Contrast  Result Date: 02/24/2019 CLINICAL DATA:  Syncope EXAM: CT HEAD WITHOUT CONTRAST TECHNIQUE: Contiguous axial images were obtained from the base of the skull through the vertex without intravenous contrast. COMPARISON:  10/28/2011 FINDINGS: Brain: Mild age related volume loss. No acute intracranial abnormality. Specifically,  no hemorrhage, hydrocephalus, mass lesion, acute infarction, or significant intracranial injury. Vascular: No hyperdense vessel or unexpected calcification. Skull: No acute calvarial abnormality. Sinuses/Orbits: Mucosal thickening and air-fluid level in the right maxillary sinus. Other: None IMPRESSION: No acute intracranial abnormality. Acute on chronic right maxillary sinusitis. Electronically Signed   By: Rolm Baptise M.D.   On: 02/24/2019 23:45   Vas US Carotid  Result Date: 02/26/2019 Carotid Arterial Duplex Study Indications:       Syncope and right endarterectomy 11/2017 at [facility]. Risk Factors:      Hypertension, hyperlipidemia, coronary artery disease, PAD. Other Factors:     History of bilateral common iliac arterial stents, left                    external iliac stent, bilateral. Comparison Study:  No prior study on file at [facility] Technologist: Sharion Dove RVS  Examination Guidelines: A complete evaluation includes B-mode imaging, spectral Doppler, color Doppler, and power Doppler as needed of all accessible portions of each vessel. Bilateral testing is considered an integral part of a complete examination. Limited examinations for reoccurring indications may be  performed as noted.  Right Carotid Findings: +----------+--------+--------+--------+------------------+------------------+           PSV cm/sEDV cm/sStenosisPlaque DescriptionComments           +----------+--------+--------+--------+------------------+------------------+ CCA Prox  168     19                                intimal thickening +----------+--------+--------+--------+------------------+------------------+ CCA Distal113     20                                intimal thickening +----------+--------+--------+--------+------------------+------------------+ ICA Prox  134     23      1-39%   heterogenous      CEA patent          +----------+--------+--------+--------+------------------+------------------+ ICA Distal119     22                                                   +----------+--------+--------+--------+------------------+------------------+ ECA       132     23                                                   +----------+--------+--------+--------+------------------+------------------+ +----------+--------+-------+--------+-------------------+           PSV cm/sEDV cmsDescribeArm Pressure (mmHG) +----------+--------+-------+--------+-------------------+ Subclavian301                                        +----------+--------+-------+--------+-------------------+ +---------+--------+--+--------+--+ VertebralPSV cm/s66EDV cm/s14 +---------+--------+--+--------+--+  Left Carotid Findings: +----------+--------+--------+--------+------------------+------------------+           PSV cm/sEDV cm/sStenosisPlaque DescriptionComments           +----------+--------+--------+--------+------------------+------------------+ CCA Prox  120     12                                intimal thickening +----------+--------+--------+--------+------------------+------------------+ CCA Distal108     23                                intimal thickening +----------+--------+--------+--------+------------------+------------------+ ICA Prox  102     33      1-39%   calcific          Shadowing          +----------+--------+--------+--------+------------------+------------------+ ICA Distal96      96                                                   +----------+--------+--------+--------+------------------+------------------+ ECA       101     12                                                   +----------+--------+--------+--------+------------------+------------------+ +----------+--------+--------+--------+-------------------+  PSV cm/sEDV cm/sDescribeArm Pressure  (mmHG) +----------+--------+--------+--------+-------------------+ [id number]                                         +----------+--------+--------+--------+-------------------+ +---------+--------+--+--------+--+ VertebralPSV cm/s50EDV cm/s20 +---------+--------+--+--------+--+  Summary: Right Carotid: Velocities in the right ICA are consistent with a 1-39% stenosis.                Patent CEA. Left Carotid: Velocities in the left ICA are consistent with a 1-39% stenosis. Vertebrals:  Bilateral vertebral arteries demonstrate antegrade flow. Subclavians: Normal flow hemodynamics were seen in bilateral subclavian              arteries. *See table(s) above for measurements and observations.     Preliminary        Subjective:  Patient seen and examined the bedside this morning.  Hemodynamically stable for discharge. Discharge Exam: Vitals:   02/27/19 0431 02/27/19 0802  BP: (!) 137/119 118/71  Pulse: 96 80  Resp: 16 16  Temp: 98.4 F (36.9 C) 98.3 F (36.8 C)  SpO2: 97% 98%   Vitals:   02/27/19 0018 02/27/19 0431 02/27/19 0656 02/27/19 0802  BP:  (!) 137/119  118/71  Pulse: 100 96  80  Resp:  16  16  Temp:  98.4 F (36.9 C)  98.3 F (36.8 C)  TempSrc:  Oral  Oral  SpO2:  97%  98%  Weight:   67.1 kg   Height:        General: Pt is alert, awake, not in acute distress Cardiovascular: RRR, S1/S2 +, no rubs, no gallops Respiratory: CTA bilaterally, no wheezing, no rhonchi Abdominal: Soft, NT, ND, bowel sounds + Extremities: no edema, no cyanosis    The results of significant diagnostics from this hospitalization (including imaging, microbiology, ancillary and laboratory) are listed below for reference.     Microbiology: Recent Results (from the past 240 hour(s))  Urine culture     Status: None   Collection Time: 02/21/19 12:43 AM   Specimen: Urine, Random  Result Value Ref Range Status   Specimen Description   Final    URINE, RANDOM Performed at [facility]  [place], [address] [place], [place] [address]    Special Requests   Final    NONE Performed at [facility] [place], [address] [place], [place] [address]    Culture   Final    NO GROWTH Performed at [facility], [address] [place], [place] [address]    Report Status 02/22/2019 FINAL  Final  Urine culture     Status: None   Collection Time: 02/24/19 11:19 PM   Specimen: Urine, Catheterized  Result Value Ref Range Status   Specimen Description   Final    URINE, CATHETERIZED Performed at [facility] [place], [address] [place], [place] [address]    Special Requests   Final    Normal Performed at [facility] [place], [address] [place], [place] [address]    Culture   Final    NO GROWTH Performed at [facility], [address] [place], [place] [address]    Report Status 02/26/2019 FINAL  Final  SARS CORONAVIRUS 2 (TAT 6-24 HRS) Nasopharyngeal Nasopharyngeal Swab     Status: None   Collection Time: 02/25/19 12:13 AM   Specimen: Nasopharyngeal Swab  Result Value  Ref Range Status   SARS Coronavirus 2 NEGATIVE NEGATIVE Final    Comment: (NOTE) SARS-CoV-2 target nucleic acids are NOT DETECTED. The SARS-CoV-2 RNA is generally detectable in upper and lower respiratory specimens during the acute phase of infection. Negative results do not preclude SARS-CoV-2 infection, do not rule out co-infections with other pathogens, and should not be used as the sole basis for treatment or other patient management decisions. Negative results must be combined with clinical observations, patient history, and epidemiological information. The expected result is Negative. Fact Sheet for Patients: HairSlick.no Fact Sheet for Healthcare Providers: quierodirigir.com This test is not yet approved or cleared by the [place] FDA and  has been authorized for detection and/or diagnosis of  SARS-CoV-2 by FDA under an Emergency Use Authorization (EUA). This EUA will remain  in effect (meaning this test can be used) for the duration of the COVID-19 declaration under Section 56 4(b)(1) of the Act, 21 U.S.C. section 360bbb-3(b)(1), unless the authorization is terminated or revoked sooner. Performed at [facility], [address] [place], [place] [address]      Labs: BNP (last 3 results) No results for input(s): BNP in the last 8760 hours. Basic Metabolic Panel: Recent Labs  Lab 02/24/19 2319 02/26/19 0237  NA 137 140  K 4.1 3.8  CL 105 111  CO2 23 22  GLUCOSE 95 100*  BUN 17 8  CREATININE 0.73 0.79  CALCIUM 9.1 8.7*   Liver Function Tests: No results for input(s): AST, ALT, ALKPHOS, BILITOT, PROT, ALBUMIN in the last 168 hours. No results for input(s): LIPASE, AMYLASE in the last 168 hours. No results for input(s): AMMONIA in the last 168 hours. CBC: Recent Labs  Lab 02/24/19 2319 02/26/19 0237  WBC 8.5 5.4  NEUTROABS 6.2  --   HGB 10.3* 9.8*  HCT 34.6* 31.5*  MCV 91.5 90.3  PLT 235 219   Cardiac Enzymes: No results for input(s): CKTOTAL, CKMB, CKMBINDEX, TROPONINI in the last 168 hours. BNP: Invalid input(s): POCBNP CBG: No results for input(s): GLUCAP in the last 168 hours. D-Dimer No results for input(s): DDIMER in the last 72 hours. Hgb A1c No results for input(s): HGBA1C in the last 72 hours. Lipid Profile No results for input(s): CHOL, HDL, LDLCALC, TRIG, CHOLHDL, LDLDIRECT in the last 72 hours. Thyroid function studies Recent Labs    02/25/19 0958  TSH 0.780   Anemia work up No results for input(s): VITAMINB12, FOLATE, FERRITIN, TIBC, IRON, RETICCTPCT in the last 72 hours. Urinalysis    Component Value Date/Time   COLORURINE YELLOW 02/24/2019 2319   APPEARANCEUR HAZY (A) 02/24/2019 2319   LABSPEC 1.010 02/24/2019 2319   PHURINE 5.5 02/24/2019 2319   GLUCOSEU NEGATIVE 02/24/2019 2319   HGBUR NEGATIVE 02/24/2019 2319    BILIRUBINUR NEGATIVE 02/24/2019 2319   KETONESUR NEGATIVE 02/24/2019 2319   PROTEINUR NEGATIVE 02/24/2019 2319   UROBILINOGEN 0.2 06/14/2014 1930   NITRITE NEGATIVE 02/24/2019 2319   LEUKOCYTESUR TRACE (A) 02/24/2019 2319   Sepsis Labs Invalid input(s): PROCALCITONIN,  WBC,  LACTICIDVEN Microbiology Recent Results (from the past 240 hour(s))  Urine culture     Status: None   Collection Time: 02/21/19 12:43 AM   Specimen: Urine, Random  Result Value Ref Range Status   Specimen Description   Final    URINE, RANDOM Performed at [facility], [address] [place], [place] [address]    Special Requests   Final    NONE Performed at [facility]  [address], [place], [place] [address]    Culture   Final    NO GROWTH Performed at [facility], [address], [place], [place] [address]    Report Status 02/22/2019 FINAL  Final  Urine culture     Status: None   Collection Time: 02/24/19 11:19 PM   Specimen: Urine, Catheterized  Result Value Ref Range Status   Specimen Description   Final    URINE, CATHETERIZED Performed at [facility], [address] [place], [place] [address]    Special Requests   Final    Normal Performed at [facility], [address] [place], [place] [address]    Culture   Final    NO GROWTH Performed at [facility], [address], [place], [place] [address]    Report Status 02/26/2019 FINAL  Final  SARS CORONAVIRUS 2 (TAT 6-24 HRS) Nasopharyngeal Nasopharyngeal Swab     Status: None   Collection Time: 02/25/19 12:13 AM   Specimen: Nasopharyngeal Swab  Result Value Ref Range Status   SARS Coronavirus 2 NEGATIVE NEGATIVE Final    Comment: (NOTE) SARS-CoV-2 target nucleic acids are NOT DETECTED. The SARS-CoV-2 RNA is generally detectable in upper and lower respiratory specimens during the acute phase of infection. Negative results do not preclude SARS-CoV-2 infection, do not rule  out co-infections with other pathogens, and should not be used as the sole basis for treatment or other patient management decisions. Negative results must be combined with clinical observations, patient history, and epidemiological information. The expected result is Negative. Fact Sheet for Patients: HairSlick.no Fact Sheet for Healthcare Providers: quierodirigir.com This test is not yet approved or cleared by the [place] FDA and  has been authorized for detection and/or diagnosis of SARS-CoV-2 by FDA under an Emergency Use Authorization (EUA). This EUA will remain  in effect (meaning this test can be used) for the duration of the COVID-19 declaration under Section 56 4(b)(1) of the Act, 21 U.S.C. section 360bbb-3(b)(1), unless the authorization is terminated or revoked sooner. Performed at [facility], [address] [place], [place] [address]     Please note: You were cared for by a hospitalist during your hospital stay. Once you are discharged, your primary care physician will handle any further medical issues. Please note that NO REFILLS for any discharge medications will be authorized once you are discharged, as it is imperative that you return to your primary care physician (or establish a relationship with a primary care physician if you do not have one) for your post hospital discharge needs so that they can reassess your need for medications and monitor your lab values.    Time coordinating discharge: 40 minutes  SIGNED:   Burnadette Pop, MD  [facility] 02/27/2019, 10:45 AM Pager [phone]  If 7PM-7AM, please contact night-coverage [email or web address] Password [email or web address]

## 2019-02-27 NOTE — Progress Notes (Signed)
  Echocardiogram 2D Echocardiogram has been performed.  Randa Lynn Kellyn Mccary 02/27/2019, 9:32 AM

## 2019-03-21 ENCOUNTER — Encounter

## 2019-03-21 ENCOUNTER — Ambulatory Visit (INDEPENDENT_AMBULATORY_CARE_PROVIDER_SITE_OTHER): Payer: Medicare Other | Admitting: Neurology

## 2019-03-21 ENCOUNTER — Other Ambulatory Visit: Payer: Self-pay

## 2019-03-21 ENCOUNTER — Encounter: Payer: Self-pay | Admitting: Neurology

## 2019-03-21 VITALS — BP 143/70 | HR 99 | Temp 97.7°F | Ht 68.0 in | Wt 142.2 lb

## 2019-03-21 DIAGNOSIS — M069 Rheumatoid arthritis, unspecified: Secondary | ICD-10-CM

## 2019-03-21 DIAGNOSIS — M4317 Spondylolisthesis, lumbosacral region: Secondary | ICD-10-CM

## 2019-03-21 DIAGNOSIS — I6529 Occlusion and stenosis of unspecified carotid artery: Secondary | ICD-10-CM

## 2019-03-21 DIAGNOSIS — R55 Syncope and collapse: Secondary | ICD-10-CM

## 2019-03-21 DIAGNOSIS — R413 Other amnesia: Secondary | ICD-10-CM | POA: Diagnosis not present

## 2019-03-21 MED ORDER — DONEPEZIL HCL 5 MG PO TABS: 5.0000 mg | ORAL_TABLET | Freq: Every day | ORAL | 5 refills | Status: DC

## 2019-03-21 NOTE — Progress Notes (Signed)
[facility]  PATIENT: Sara Villanueva DOB: July 13, 1945  REFERRING DOCTOR OR PCP:  Olivia Mackie SOURCE: patient  _________________________________   HISTORICAL  CHIEF COMPLAINT:  Chief Complaint  Patient presents with  . Follow-up    RM 12. Last seen 12/14/2017. Here d/t memory issues and back pain. Did MOCA today: 18/30  . Biopsy    HISTORY OF PRESENT ILLNESS:  She is a 73 yo woman with chronic lower back pain and memory loss  Update 03/21/2019: She has noted more difficulties with her memory.   She first noted a problem last year when she was misplacing items.   She is not noting issues with other cognitive tasks such as math or language.    STM was 2/5 at 3 minutes and 1/5 at 20 minutes 3/5 with prompts.     She is sleeping poorly due to her overactive sleep and feels she is sleeping only 3-4 hours.   She does not feel refreshed when she wakes up.  She had an episode of syncope and presented to the emergency room.  She had a CT scan which showed no acute findings..  I personally reviewed the CT scan of the head performed 02/24/2019.   She has mild generalized atrophy but no acute findings.  Echocardiogram showed severe left atrial enlargement mild mitral valve regurg and a reduced ejection fraction of 45 to 50%.  There was diastolic dysfunction.  She has carotid arterial disease and underwent right carotid endarterectomy July 2019.  She follows up with vascular surgery.  She also has had bilateral renal artery stents and has coronary artery disease.  Vascular risk factors include hyperlipidemia and HTN.   She is scheduled to have carotid U/S again next week.     She has had urinary retention and will be having cystoscopy soon.  She is currently doing self-catheterization.     TSH, B12, Vit D were all normal over th past year.  She has RA and chronic lumbar pain.      Montreal Cognitive Assessment  03/21/2019  Visuospatial/ Executive (0/5) 1  Naming (0/3) 3   Attention: Read list of digits (0/2) 1  Attention: Read list of letters (0/1) 1  Attention: Serial 7 subtraction starting at 100 (0/3) 1  Language: Repeat phrase (0/2) 2  Language : Fluency (0/1) 0  Abstraction (0/2) 1  Delayed Recall (0/5) 2  Orientation (0/6) 6  Total 18  Adjusted Score (based on education) 19     Update 12/14/2017: She feels her back is much better since she was diagnosed with RA and was started on Remicade.    She sees Dr. Dierdre Forth of Rheumatology.   It has helped her more than the ESI did.      She has had a carotid doppler recently and has 80% right ICA and has 60% left ICA stenosis.   She is scheduled to have a right CEA.     She is noting more swelling in her left ankle.    She notes some pain there in the afternoons.     She is on meloxicam and gabapentin for her pain.       Update 06/15/2017:  She has pain that is mostly in the back but also near the inside left knee and posterior ankle.    She feels gabapentin used to help her but not much now.   Last year, she had an epidural steroid injection for probable left L5 radiculopathy but did not receive any benefit.  She sore neurosurgery (Dr. Petra KubaKilpatrick), but surgery was not felt to be a good option for her. Also of note, she has rheumatoid arthritis.  I personally reviewed an [facility] last month. It shows spondylolisthesis of L5 upon S1 associated with facet hypertrophy and disc bulging of the uncovered disc. There is moderately severe left foraminal narrowing with possible left L5 nerve root compression. Additionally there is moderate bilateral lateral recess stenosis. No nerve root impingement at other levels.  She also reports some neck pain but that has been pretty stable and does not bother her much at this time.  From 01/25/2017:  Low back pain: She is reporting more pain going down the left leg. Previously her pain was predominantly in the lower back but now it is worse in  the leg than the back. She denies any weakness but does note some sensory changes in the foot. There is no problems with bowel or bladder function.   She sees Dr. Dierdre ForthBeekman of Rheum (has RA) and was referred to Dr. Shon BatonBrooks.   She had one ESI with him but she feels she did not get any benefit.   She has significant spondylolisthesis at L5-S1 and by history and exam appears to have an L5 radiculopathy on the left. I had done several epidural steroids on her years ago but we do not have the equipment at the current office.   Trigger point injections have helped her some in the past but we are trying to limit these as she has avascular necrosis. Current low back pain is milder but the left leg pain is more severe. It is intermittent and will increase with certain positions. Gabapentin initially seemed to help but not as much recently.  Dysesthesia:   She has a dysesthetic sensation in the left foot and in her fingers.   REVIEW OF SYSTEMS: Constitutional: No fevers, chills, sweats, or change in appetite Eyes: No visual changes, double vision, eye pain Ear, nose and throat: No hearing loss, ear pain, nasal congestion, sore throat Cardiovascular: No chest pain, palpitations Respiratory: No shortness of breath at rest or with exertion.   No wheezes GastrointestinaI: No nausea, vomiting, diarrhea, abdominal pain, fecal incontinence Genitourinary: No dysuria, urinary retention or frequency.  No nocturia. Musculoskeletal: Mild neck pain; moderate back pain and left hip pain Integumentary: No rash, pruritus, skin lesions Neurological: as above Psychiatric: No depression at this time.  No anxiety Endocrine: No palpitations, diaphoresis, change in appetite, change in weigh or increased thirst Hematologic/Lymphatic: No anemia, purpura, petechiae. Allergic/Immunologic: No itchy/runny eyes, nasal congestion, recent allergic reactions, rashes  ALLERGIES: Allergies  Allergen Reactions  . Nickel Rash  . Latex    . Lamictal [Lamotrigine] Rash    HOME MEDICATIONS:  Current Outpatient Medications:  .  aspirin 81 MG tablet, Take 81 mg by mouth at bedtime. , Disp: , Rfl:  .  busPIRone (BUSPAR) 15 MG tablet, Take 15 mg by mouth 3 (three) times daily. , Disp: , Rfl:  .  clopidogrel (PLAVIX) 75 MG tablet, Take 75 mg by mouth daily., Disp: , Rfl:  .  gabapentin (NEURONTIN) 800 MG tablet, TAKE 1 TABLET BY MOUTH THREE TIMES DAILY, Disp: 90 tablet, Rfl: 1 .  NITROSTAT 0.4 MG SL tablet, Place 0.4 mg under the tongue every 5 (five) minutes as needed for chest pain. , Disp: , Rfl:  .  pravastatin (PRAVACHOL) 40 MG tablet, Take 40 mg by mouth daily., Disp: , Rfl:  .  tamsulosin (FLOMAX) 0.4  MG CAPS capsule, Take 1 capsule (0.4 mg total) by mouth daily., Disp: 30 capsule, Rfl: 0 .  trospium (SANCTURA) 20 MG tablet, Take 20 mg by mouth at bedtime., Disp: , Rfl:  .  donepezil (ARICEPT) 5 MG tablet, Take 1 tablet (5 mg total) by mouth at bedtime., Disp: 30 tablet, Rfl: 5  PAST MEDICAL HISTORY: Past Medical History:  Diagnosis Date  . Arthritis   . Bladder disorder   . CAD (coronary artery disease)    stents x 3, 2013, 2017  . Carotid arterial disease (HCC)   . High cholesterol   . Hypertension   . Rheumatoid arteritis (HCC)   . Rheumatoid arthritis (HCC)     PAST SURGICAL HISTORY: Past Surgical History:  Procedure Laterality Date  . CAROTID ENDARTERECTOMY    . CORONARY ANGIOPLASTY WITH STENT PLACEMENT      FAMILY HISTORY: Family History  Problem Relation Age of Onset  . Heart disease Mother   . Heart disease Father     SOCIAL HISTORY:  Social History   Socioeconomic History  . Marital status: Married    Spouse name: Not on file  . Number of children: Not on file  . Years of education: Not on file  . Highest education level: Not on file  Occupational History  . Not on file  Social Needs  . Financial resource strain: Not on file  . Food insecurity    Worry: Not on file    Inability:  Not on file  . Transportation needs    Medical: Not on file    Non-medical: Not on file  Tobacco Use  . Smoking status: Former Smoker    Packs/day: 0.50    Years: 25.00    Pack years: 12.50    Types: Cigarettes    Quit date: 2000    Years since quitting: 20.8  . Smokeless tobacco: Never Used  Substance and Sexual Activity  . Alcohol use: No    Alcohol/week: 0.0 standard drinks  . Drug use: No  . Sexual activity: Not on file  Lifestyle  . Physical activity    Days per week: Not on file    Minutes per session: Not on file  . Stress: Not on file  Relationships  . Social Musician on phone: Not on file    Gets together: Not on file    Attends religious service: Not on file    Active member of club or organization: Not on file    Attends meetings of clubs or organizations: Not on file    Relationship status: Not on file  . Intimate partner violence    Fear of current or ex partner: Not on file    Emotionally abused: Not on file    Physically abused: Not on file    Forced sexual activity: Not on file  Other Topics Concern  . Not on file  Social History Narrative  . Not on file     PHYSICAL EXAM  Vitals:   03/21/19 1256  BP: (!) 143/70  Pulse: 99  Temp: 97.7 F (36.5 C)  Weight: 142 lb 3.2 oz (64.5 kg)  Height: 5\' 8"  (1.727 m)    Body mass index is 21.62 kg/m.   General: The patient is well-developed and well-nourished and in no acute distress.    Skin/Musculoskeletal:    She has mild pedal edema..  Today, she does not have much tenderness in the lumbar region or over the piriformis muscles.Marland Kitchen  Neurologic Exam  Mental status: The patient is alert and oriented x 3 at the time of the examination.    Cranial nerves: Extraocular movements are full.      Motor:  Muscle bulk is normal.   Tone is normal. Strength is 5/5 in the arms and legs except for 4+/5 EHL strength on the left..   Sensory: She has intact sensation to touch and vibration in the  hands. In the legs, she has symmetric vibration sensation.  There is reduced sensation in the L5 distribution on the left.  Gait and station: Station is normal.   The gait is arthritic. The tandem gait is wide. There is no Romberg sign.   Reflexes: Deep tendon reflexes are symmetric and normal bilaterally.       DIAGNOSTIC DATA (LABS, IMAGING, TESTING) - I reviewed patient records, labs, notes, testing and imaging myself where available.  Lab Results  Component Value Date   WBC 5.4 02/26/2019   HGB 9.8 (L) 02/26/2019   HCT 31.5 (L) 02/26/2019   MCV 90.3 02/26/2019   PLT 219 02/26/2019      Component Value Date/Time   NA 140 02/26/2019 0237   K 3.8 02/26/2019 0237   CL 111 02/26/2019 0237   CO2 22 02/26/2019 0237   GLUCOSE 100 (H) 02/26/2019 0237   BUN 8 02/26/2019 0237   CREATININE 0.79 02/26/2019 0237   CALCIUM 8.7 (L) 02/26/2019 0237   PROT 7.2 12/31/2018 1238   ALBUMIN 3.6 12/31/2018 1238   AST 37 12/31/2018 1238   ALT 47 (H) 12/31/2018 1238   ALKPHOS 64 12/31/2018 1238   BILITOT 0.6 12/31/2018 1238   GFRNONAA >60 02/26/2019 0237   GFRAA >60 02/26/2019 0237       ASSESSMENT AND PLAN  Rheumatoid arthritis, involving unspecified site, unspecified whether rheumatoid factor present ([place])  Memory loss  Spondylolisthesis at L5-S1 level  Stenosis of carotid artery, unspecified laterality  Near syncope   1.   She is going to 19/30 on the [facility] cognitive assessment consistent with mild dementia.  CT scan did not show significant atrophy and there was not hippocampal atrophy out of proportion to the rest of the brain.  I discussed with her that she could have an early stage Alzheimer's or other dementia (such as Lewy body).  However, we cannot be completely certain as there was no significant atrophy.  She is sleeping poorly and I could be playing a role as well.  I am going to place her on donepezil 5 mg nightly.  She is having a procedure for her bladder sling  which will hopefully help the nocturia that is affecting her sleep.   2.    Low back pain has been less troublesome recently.   3.   Because of her recent syncope is unclear.  Seizure is unlikely. 4.    return to clinic in 12 months or sooner if there are new or worsening neurologic symptoms.  Richard A. Felecia Shelling, MD, PhD 93/[id number]/6967, 8:93 PM Certified in Neurology, Clinical Neurophysiology, Sleep Medicine, Pain Medicine and Neuroimaging  [facility] [address], [place], [place] [address] [phone]

## 2019-03-26 ENCOUNTER — Other Ambulatory Visit: Payer: Self-pay

## 2019-03-26 ENCOUNTER — Encounter (HOSPITAL_BASED_OUTPATIENT_CLINIC_OR_DEPARTMENT_OTHER): Payer: Self-pay | Admitting: Emergency Medicine

## 2019-03-26 ENCOUNTER — Emergency Department (HOSPITAL_BASED_OUTPATIENT_CLINIC_OR_DEPARTMENT_OTHER)
Admission: EM | Admit: 2019-03-26 | Discharge: 2019-03-26 | Disposition: A | Discharge status: Home / Self Care | Payer: Medicare Other | Attending: Emergency Medicine | Admitting: Emergency Medicine

## 2019-03-26 ENCOUNTER — Emergency Department (HOSPITAL_BASED_OUTPATIENT_CLINIC_OR_DEPARTMENT_OTHER): Payer: Medicare Other

## 2019-03-26 DIAGNOSIS — I10 Essential (primary) hypertension: Secondary | ICD-10-CM | POA: Diagnosis not present

## 2019-03-26 DIAGNOSIS — R109 Unspecified abdominal pain: Secondary | ICD-10-CM | POA: Diagnosis present

## 2019-03-26 DIAGNOSIS — R339 Retention of urine, unspecified: Secondary | ICD-10-CM | POA: Diagnosis not present

## 2019-03-26 DIAGNOSIS — R1084 Generalized abdominal pain: Secondary | ICD-10-CM | POA: Insufficient documentation

## 2019-03-26 DIAGNOSIS — R079 Chest pain, unspecified: Secondary | ICD-10-CM | POA: Diagnosis not present

## 2019-03-26 DIAGNOSIS — I259 Chronic ischemic heart disease, unspecified: Secondary | ICD-10-CM | POA: Diagnosis not present

## 2019-03-26 DIAGNOSIS — R55 Syncope and collapse: Secondary | ICD-10-CM | POA: Insufficient documentation

## 2019-03-26 DIAGNOSIS — Z91040 Latex allergy status: Secondary | ICD-10-CM | POA: Diagnosis not present

## 2019-03-26 DIAGNOSIS — Z87891 Personal history of nicotine dependence: Secondary | ICD-10-CM | POA: Diagnosis not present

## 2019-03-26 DIAGNOSIS — Z79899 Other long term (current) drug therapy: Secondary | ICD-10-CM | POA: Insufficient documentation

## 2019-03-26 DIAGNOSIS — I959 Hypotension, unspecified: Secondary | ICD-10-CM | POA: Insufficient documentation

## 2019-03-26 DIAGNOSIS — Z7982 Long term (current) use of aspirin: Secondary | ICD-10-CM | POA: Insufficient documentation

## 2019-03-26 LAB — COMPREHENSIVE METABOLIC PANEL
ALT: 21 U/L (ref 0–44)
AST: 28 U/L (ref 15–41)
Albumin: 3.3 g/dL — ABNORMAL LOW (ref 3.5–5.0)
Alkaline Phosphatase: 65 U/L (ref 38–126)
Anion gap: 10 (ref 5–15)
BUN: 15 mg/dL (ref 8–23)
CO2: 21 mmol/L — ABNORMAL LOW (ref 22–32)
Calcium: 9.1 mg/dL (ref 8.9–10.3)
Chloride: 108 mmol/L (ref 98–111)
Creatinine, Ser: 0.76 mg/dL (ref 0.44–1.00)
GFR calc Af Amer: >60 mL/min (ref >60)
GFR calc non Af Amer: >60 mL/min (ref >60)
Glucose, Bld: 157 mg/dL — ABNORMAL HIGH (ref 70–99)
Potassium: 3.7 mmol/L (ref 3.5–5.1)
Sodium: 139 mmol/L (ref 135–145)
Total Bilirubin: 0.6 mg/dL (ref 0.3–1.2)
Total Protein: 6.8 g/dL (ref 6.5–8.1)

## 2019-03-26 LAB — URINALYSIS, ROUTINE W REFLEX MICROSCOPIC
Bilirubin Urine: NEGATIVE
Glucose, UA: NEGATIVE mg/dL
Hgb urine dipstick: NEGATIVE
Ketones, ur: NEGATIVE mg/dL
Leukocytes,Ua: NEGATIVE
Nitrite: NEGATIVE
Protein, ur: NEGATIVE mg/dL
Specific Gravity, Urine: 1.01 (ref 1.005–1.030)
pH: 7.5 (ref 5.0–8.0)

## 2019-03-26 LAB — TROPONIN I (HIGH SENSITIVITY)
Troponin I (High Sensitivity): 23 ng/L — ABNORMAL HIGH (ref <18)
Troponin I (High Sensitivity): 13 ng/L (ref <18)

## 2019-03-26 LAB — CBC WITH DIFFERENTIAL/PLATELET
Abs Immature Granulocytes: 0.01 10*3/uL (ref 0.00–0.07)
Basophils Absolute: 0 10*3/uL (ref 0.0–0.1)
Basophils Relative: 0 %
Eosinophils Absolute: 0.1 10*3/uL (ref 0.0–0.5)
Eosinophils Relative: 1 %
HCT: 31.8 % — ABNORMAL LOW (ref 36.0–46.0)
Hemoglobin: 9.8 g/dL — ABNORMAL LOW (ref 12.0–15.0)
Immature Granulocytes: 0 %
Lymphocytes Relative: 22 %
Lymphs Abs: 1.6 10*3/uL (ref 0.7–4.0)
MCH: 27.2 pg (ref 26.0–34.0)
MCHC: 30.8 g/dL (ref 30.0–36.0)
MCV: 88.3 fL (ref 80.0–100.0)
Monocytes Absolute: 0.5 10*3/uL (ref 0.1–1.0)
Monocytes Relative: 7 %
Neutro Abs: 5.3 10*3/uL (ref 1.7–7.7)
Neutrophils Relative %: 70 %
Platelets: 339 10*3/uL (ref 150–400)
RBC: 3.6 MIL/uL — ABNORMAL LOW (ref 3.87–5.11)
RDW: 17.2 % — ABNORMAL HIGH (ref 11.5–15.5)
WBC: 7.5 10*3/uL (ref 4.0–10.5)
nRBC: 0 % (ref 0.0–0.2)

## 2019-03-26 LAB — LACTIC ACID, PLASMA
Lactic Acid, Venous: 1.7 mmol/L (ref 0.5–1.9)
Lactic Acid, Venous: 1.5 mmol/L (ref 0.5–1.9)

## 2019-03-26 LAB — PROTIME-INR
INR: 1.1 (ref 0.8–1.2)
Prothrombin Time: 13.7 seconds (ref 11.4–15.2)

## 2019-03-26 LAB — LIPASE, BLOOD: Lipase: 28 U/L (ref 11–51)

## 2019-03-26 LAB — OCCULT BLOOD X 1 CARD TO LAB, STOOL: Fecal Occult Bld: POSITIVE — AB

## 2019-03-26 MED ORDER — IOHEXOL 350 MG/ML SOLN
100.0000 mL | Freq: Once | INTRAVENOUS | Status: AC | PRN
  Administered 2019-03-26: 100 mL via INTRAVENOUS

## 2019-03-26 MED ORDER — OXYCODONE-ACETAMINOPHEN 5-325 MG PO TABS
1.0000 | ORAL_TABLET | Freq: Once | ORAL | Status: AC
  Administered 2019-03-26: 1 via ORAL
  Filled 2019-03-26: qty 1

## 2019-03-26 MED ORDER — OXYCODONE-ACETAMINOPHEN 5-325 MG PO TABS: 1.0000 | ORAL_TABLET | Freq: Four times a day (QID) | ORAL | 0 refills | Status: DC | PRN

## 2019-03-26 MED ORDER — SODIUM CHLORIDE 0.9 % IV BOLUS
1000.0000 mL | Freq: Once | INTRAVENOUS | Status: AC
  Administered 2019-03-26: 1000 mL via INTRAVENOUS

## 2019-03-26 NOTE — Discharge Instructions (Signed)
1.  Call your urologist office in the morning and let them know you had to be seen in the emergency department for urinary retention.  Let them know that you have a indwelling Foley catheter in place. 2.  Return to the emergency department if your catheter is not draining, you develop severe pain that is not relieved by medications, fever, confusion or other concerning symptoms.

## 2019-03-26 NOTE — ED Notes (Addendum)
Foley care and Leg bag care reviewed with pt and daughter. Rx x 1 for percocet given to pt's family at bedside.

## 2019-03-26 NOTE — ED Notes (Signed)
ED Provider at bedside. 

## 2019-03-26 NOTE — ED Notes (Signed)
Pt became unresponsive in triage. Pt laid flat and moved to rm 14. EDP at bedside.

## 2019-03-26 NOTE — ED Triage Notes (Addendum)
Generalized abd pain and headache today. She self cath's for urine. Endorses lightheadedness, denies N/V/D

## 2019-03-26 NOTE — ED Provider Notes (Signed)
[facility] EMERGENCY DEPARTMENT Provider Note   CSN: [id number] Arrival date & time: 03/26/19  [id number]     History   Chief Complaint Chief Complaint  Patient presents with  . Abdominal Pain    HPI Sara Villanueva is a 73 y.o. female.     HPI Patient reports that she got a severe sudden abdominal pain last night at about 7 PM while she was watching television.  She reports it was really intense and lasted for about 30 minutes but then resolved on its own.  She reports she was able to go to bed.  She did not develop any vomiting, diarrhea.  She reports this morning, after she got up the pain came back and it was very severe in her central mid abdomen.  She reports it was radiating into her back as well.  She reports she felt lightheaded and dizzy.  She came to the emergency department and had a syncopal episode in the triage.  The patient's blood pressure was systolic 70s and she was brought back to a room on the stretcher.  Patient does have a history of bladder outlet dysfunction and has been doing self-catheterization for several weeks.  She reports that she was able to catheterize previously but over the past 2 days has not really been able to get any urine out.  She did drink a large volume of water yesterday trying to make sure she was hydrated.  She is scheduled to see her urologist for follow-up on November 4 for evaluation for probable cystoscopy and possibly bladder neck surgery.  They were trying to do medical management with Flomax and self-catheterization for a month to see if this was effective. Past Medical History:  Diagnosis Date  . Arthritis   . Bladder disorder   . CAD (coronary artery disease)    stents x 3, 2013, 2017  . Carotid arterial disease (HCC)   . High cholesterol   . Hypertension   . Rheumatoid arteritis (HCC)   . Rheumatoid arthritis [facility](HCC)     Patient Active Problem List   Diagnosis Date Noted  . Memory loss 03/21/2019  . Near syncope  02/25/2019  . UTI due to extended-spectrum beta lactamase (ESBL) producing Escherichia coli 02/25/2019  . Urinary retention with incomplete bladder emptying 02/25/2019  . Knee pain, left 08/05/2015  . Allergic rhinitis 07/15/2015  . Carotid artery narrowing 07/15/2015  . Peripheral vascular disease (HCC) 07/15/2015  . Renal artery stenosis (HCC) 07/15/2015  . 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 Benign essential HTN 06/13/2015  . HLD (hyperlipidemia) 06/13/2015  . Rheumatoid arthritis (HCC) 06/13/2015  . Urgency of micturation 05/03/2015  . FOM (frequency of micturition) 05/03/2015  . Pedal edema 12/04/2014  . Dysesthesia 12/04/2014  . Nuclear sclerotic cataract 09/20/2014  . Lumbosacral radiculopathy 08/16/2014  . Spondylolisthesis at L5-S1 level 08/07/2014  . Neck pain 08/07/2014  . Chronic rheumatic arthritis (HCC) 08/07/2014  . Neuralgia neuritis, sciatic nerve 08/03/2014  . Aseptic bony necrosis (HCC) 08/03/2014  . Aseptic necrosis of bone (HCC) 08/03/2014    Past Surgical History:  Procedure Laterality Date  . CAROTID ENDARTERECTOMY    . CORONARY ANGIOPLASTY WITH STENT PLACEMENT       OB History   No obstetric history on file.      Home Medications    Prior to Admission medications   Medication Sig Start Date End Date Taking? Authorizing Provider  aspirin 81 MG tablet Take 81 mg by mouth at bedtime.  [provider]  busPIRone (BUSPAR) 15 MG tablet Take 15 mg by mouth 3 (three) times daily.  08/01/14   [provider]  clopidogrel (PLAVIX) 75 MG tablet Take 75 mg by mouth daily.    [provider]  donepezil (ARICEPT) 5 MG tablet Take 1 tablet (5 mg total) by mouth at bedtime. 03/21/19   Sater, Nanine Means, MD  gabapentin (NEURONTIN) 800 MG tablet TAKE 1 TABLET BY MOUTH THREE TIMES DAILY 01/30/19   Sater, Nanine Means, MD  NITROSTAT 0.4 MG SL tablet Place 0.4 mg under the tongue every 5 (five) minutes as needed for chest pain.  07/08/14    [provider]  oxyCODONE-acetaminophen (PERCOCET) 5-325 MG tablet Take 1 tablet by mouth every 6 (six) hours as needed for severe pain. 03/26/19   Charlesetta Shanks, MD  pravastatin (PRAVACHOL) 40 MG tablet Take 40 mg by mouth daily.    [provider]  tamsulosin (FLOMAX) 0.4 MG CAPS capsule Take 1 capsule (0.4 mg total) by mouth daily. 01/19/19   Palumbo, April, MD  trospium (SANCTURA) 20 MG tablet Take 20 mg by mouth at bedtime.    [provider]    Family History Family History  Problem Relation Age of Onset  . Heart disease Mother   . Heart disease Father     Social History Social History   Tobacco Use  . Smoking status: Former Smoker    Packs/day: 0.50    Years: 25.00    Pack years: 12.50    Types: Cigarettes    Quit date: 2000    Years since quitting: 20.8  . Smokeless tobacco: Never Used  Substance Use Topics  . Alcohol use: No    Alcohol/week: 0.0 standard drinks  . Drug use: No     Allergies   Nickel, Latex, and Lamictal [lamotrigine]   Review of Systems Review of Systems 10 Systems reviewed and are negative for acute change except as noted in the HPI.  Physical Exam Updated Vital Signs BP (!) 167/82   Pulse 89   Temp 99.2 F (37.3 C) (Rectal)   Resp 15   Ht 5\' 8"  (1.727 m)   Wt 64.4 kg   SpO2 100%   BMI 21.59 kg/m   Physical Exam Constitutional:      Comments: On arrival, patient is pale and slightly diaphoretic.  She is alert and oriented no respiratory distress.  HENT:     Head: Normocephalic and atraumatic.     Mouth/Throat:     Pharynx: Oropharynx is clear.  Eyes:     Extraocular Movements: Extraocular movements intact.  Cardiovascular:     Rate and Rhythm: Normal rate and regular rhythm.     Comments: Pulses were immediately checked and present.  She had present femoral pulses 2+ and symmetric. Pulmonary:     Effort: Pulmonary effort is normal.     Breath sounds: Normal breath sounds.  Abdominal:      Comments: Abdomen is very tender.  Central abdominal tenderness supra umbilical.  Pulsatile palpable aorta is present.  Positive bruit with auscultation over the aorta in the supraumbilical position.  Patient did have a positive and symmetric femoral and pedal pulses.  Musculoskeletal: Normal range of motion.        General: No swelling or tenderness.     Right lower leg: No edema.     Left lower leg: No edema.  Skin:    General: Skin is warm.     Comments: On arrival  skin was warm and slightly diaphoretic.  Neurological:     General: No focal deficit present.     Mental Status: She is oriented to person, place, and time.     Cranial Nerves: No cranial nerve deficit.     Coordination: Coordination normal.  Psychiatric:        Mood and Affect: Mood normal.      ED Treatments / Results  Labs (all labs ordered are listed, but only abnormal results are displayed) Labs Reviewed  CBC WITH DIFFERENTIAL/PLATELET - Abnormal; Notable for the following components:      Result Value   RBC 3.60 (*)    Hemoglobin 9.8 (*)    HCT 31.8 (*)    RDW 17.2 (*)    All other components within normal limits  COMPREHENSIVE METABOLIC PANEL - Abnormal; Notable for the following components:   CO2 21 (*)    Glucose, Bld 157 (*)    Albumin 3.3 (*)    All other components within normal limits  OCCULT BLOOD X 1 CARD TO LAB, STOOL - Abnormal; Notable for the following components:   Fecal Occult Bld POSITIVE (*)    All other components within normal limits  TROPONIN I (HIGH SENSITIVITY) - Abnormal; Notable for the following components:   Troponin I (High Sensitivity) 23 (*)    All other components within normal limits  PROTIME-INR  LIPASE, BLOOD  LACTIC ACID, PLASMA  LACTIC ACID, PLASMA  URINALYSIS, ROUTINE W REFLEX MICROSCOPIC  TROPONIN I (HIGH SENSITIVITY)    EKG EKG Interpretation  Date/Time:  Sunday March 26 2019 10:14:05 EDT Ventricular Rate:  89 PR Interval:    QRS Duration: 95 QT  Interval:  362 QTC Calculation: 441 R Axis:   74 Text Interpretation:  Sinus rhythm Right atrial enlargement LVH with secondary repolarization abnormality diffuse ST depression similar to previous but more inferior. No STEMI Confirmed by Arby Barrette [phone]) on 03/26/2019 3:00:00 PM   Radiology US Aorta  Result Date: 03/26/2019 CLINICAL DATA:  Pain and hypotension EXAM: ULTRASOUND OF ABDOMINAL AORTA TECHNIQUE: Ultrasound examination of the abdominal aorta and proximal common iliac arteries was performed to evaluate for aneurysm. Additional color and Doppler images of the distal aorta were obtained to document patency. COMPARISON:  None. FINDINGS: Abdominal aortic measurements as follows: Proximal:  2.7 x 2.8 cm Mid:  2.2 x 1.7 cm Distal:  2.2 x 2.5 cm Patent: Yes, peak systolic velocity is 100 cm/s. Extensive atherosclerotic changes are visualized. Right common iliac artery: 0.8 x 0.8 cm Left common iliac artery: 0.8 x 0.8 cm IMPRESSION: Ectatic abdominal aorta at risk for aneurysm development. Recommend followup by ultrasound in 5 years. This recommendation follows ACR consensus guidelines: White Paper of the ACR Incidental Findings Committee II on Vascular Findings. J Am Coll Radiol 2013; 10:789-794. Aortic aneurysm NOS (ICD10-I71.9) Electronically Signed   By: Romona Curls M.D.   On: 03/26/2019 11:30   Ct Angio Chest/abd/pel For Dissection W And/or W/wo  Result Date: 03/26/2019 CLINICAL DATA:  Chest pain and abdominal pain EXAM: CT ANGIOGRAPHY CHEST, ABDOMEN AND PELVIS TECHNIQUE: Multidetector CT imaging through the chest, abdomen and pelvis was performed using the standard protocol during bolus administration of intravenous contrast. Multiplanar reconstructed images and MIPs were obtained and reviewed to evaluate the vascular anatomy. CONTRAST:  OMNIPAQUE IOHEXOL 350 MG/ML SOLN COMPARISON:  CT abdomen pelvis dated 12/31/2018 and CT chest dated 09/20/2012. FINDINGS: CTA CHEST FINDINGS  Cardiovascular: Preferential opacification of the thoracic aorta. No evidence of thoracic aortic  aneurysm or dissection. No evidence of pulmonary embolism. The right pulmonary artery measures 2.6 cm in diameter, which may reflect pulmonary hypertension. The heart is mildly enlarged. No pericardial effusion. Mediastinum/Nodes: No enlarged mediastinal, hilar, or axillary lymph nodes. Thyroid gland, trachea, and esophagus demonstrate no significant findings. Lungs/Pleura: Right upper lobe predominant scar Zettie Cooley is not significantly changed and likely post infectious/inflammatory. Mild centrilobular and paraseptal emphysematous changes are seen. There is no pleural effusion or pneumothorax. Musculoskeletal: No chest wall abnormality. No acute or significant osseous findings. Review of the MIP images confirms the above findings. CTA ABDOMEN AND PELVIS FINDINGS VASCULAR Aorta: Atherosclerosis without aneurysm, dissection, vasculitis or significant stenosis. Celiac: Atherosclerosis with greater than 50% stenosis at its origin. SMA: Atherosclerosis with approximately 50% stenosis at its origin. Renals: Patent renal artery stents. IMA: Atherosclerosis evidence of aneurysm, dissection, vasculitis or significant stenosis. Inflow: Bi-iliac stents are patent. Veins: No obvious venous abnormality within the limitations of this arterial phase study. Review of the MIP images confirms the above findings. NON-VASCULAR Hepatobiliary: No focal liver abnormality is seen. No gallstones, gallbladder wall thickening, or biliary dilatation. Pancreas: Unremarkable. No pancreatic ductal dilatation or surrounding inflammatory changes. Spleen: Normal in size without focal abnormality. Adrenals/Urinary Tract: Adrenal glands are unremarkable. Other than a 1.6 cm left renal cyst and a right renal atrophy, the kidneys are normal, without renal calculi, focal lesion, or hydronephrosis. Bladder is unremarkable. Stomach/Bowel: Stomach is within normal  limits. No pericecal inflammatory changes are identified to suggest acute appendicitis. No evidence of bowel wall thickening, distention, or inflammatory changes. Lymphatic: No enlarged abdominal or pelvic lymph nodes. Reproductive: Uterus and bilateral adnexa are unremarkable. Other: No abdominal wall hernia or abnormality. No abdominopelvic ascites. Musculoskeletal: There is grade 1 anterolisthesis of L4 on L5. There is sclerosis of the superior aspect of both femoral heads which may reflect avascular necrosis. Review of the MIP images confirms the above findings. IMPRESSION: 1. No evidence of aortic dissection, aneurysm, or intramural hematoma. No evidence of pulmonary embolism. No acute findings are evident in the chest, abdomen or pelvis. 2. 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 Aortic Atherosclerosis (ICD10-I70.0). Electronically Signed   By: Romona Curls M.D.   On: 03/26/2019 12:00    Procedures Procedures (including critical care time) CRITICAL CARE Performed by: Arby Barrette   Total critical care time: 45 minutes  Critical care time was exclusive of separately billable procedures and treating other patients.  Critical care was necessary to treat or prevent imminent or life-threatening deterioration.  Critical care was time spent personally by me on the following activities: development of treatment plan with patient and/or surrogate as well as nursing, discussions with consultants, evaluation of patient's response to treatment, examination of patient, obtaining history from patient or surrogate, ordering and performing treatments and interventions, ordering and review of laboratory studies, ordering and review of radiographic studies, pulse oximetry and re-evaluation of patient's condition. Medications Ordered in ED Medications  sodium chloride 0.9 % bolus 1,000 mL (0 mLs Intravenous Stopped 03/26/19 1212)  iohexol (OMNIPAQUE) 350 MG/ML  injection 100 mL (100 mLs Intravenous Contrast Given 03/26/19 1102)  oxyCODONE-acetaminophen (PERCOCET/ROXICET) 5-325 MG per tablet 1 tablet (1 tablet Oral Given 03/26/19 1437)     Initial Impression / Assessment and Plan / ED Course  I have reviewed the triage vital signs and the nursing notes.  Pertinent labs & imaging results that were available during my care of the patient were reviewed by me and considered  in my medical decision making (see chart for details).       Patient's initial presentation was very concerning for aortic aneurysm or dissection.  She came from the waiting room syncopal and diaphoretic with hypotension and abdominal pain.  Her aorta was palpable with a bruit.  Peripheral IVs were established and fluid resuscitation initiated.  Patient's mental status remained oriented and she did not develop any respiratory distress.  She did respond well to fluid resuscitation.  I was able to have vascular ultrasound performed at bedside to essentially rule out any large aneurysm.  Patient then had CT angiogram done and has multiple chronic atherosclerotic changes but no acute vascular emergencies or other surgical intra-abdominal processes.  Ultimately, patient is identified to have significant urinary retention.  Foley catheter was placed and 2 L of urine were put out.  Patient then was able to describe that she had been unsuccessful in getting urine out of her bladder for about the past 48 hours.  Subsequent to Foley catheter, fluid resuscitation and observation, the patient improved significantly.  Blood pressures normalized and she is not orthostatic on examination.  At this point, this is consistent with a vasovagal event due to significant bladder distention and pain.  Patient does have follow-up scheduled with her urologist for ongoing management of bladder neck dysfunction.  We will leave the Foley catheter in place.  Patient is with her daughter currently who will also assist in  helping to follow-up appointments and helping the patient if she needs additional assistance at home.  Patient is alert appropriate with clear mental status.  Final Clinical Impressions(s) / ED Diagnoses   Final diagnoses:  Urinary retention  Vasovagal syncope  Generalized abdominal pain    ED Discharge Orders         Ordered    oxyCODONE-acetaminophen (PERCOCET) 5-325 MG tablet  Every 6 hours PRN     03/26/19 1451           Arby BarrettePfeiffer, Devell Parkerson, MD 03/26/19 1504

## 2019-03-27 ENCOUNTER — Emergency Department (HOSPITAL_BASED_OUTPATIENT_CLINIC_OR_DEPARTMENT_OTHER)
Admission: EM | Admit: 2019-03-27 | Discharge: 2019-03-27 | Disposition: A | Discharge status: Home / Self Care | Payer: Medicare Other | Attending: Emergency Medicine | Admitting: Emergency Medicine

## 2019-03-27 ENCOUNTER — Other Ambulatory Visit: Payer: Self-pay

## 2019-03-27 ENCOUNTER — Encounter (HOSPITAL_BASED_OUTPATIENT_CLINIC_OR_DEPARTMENT_OTHER): Payer: Self-pay | Admitting: *Deleted

## 2019-03-27 DIAGNOSIS — Z87891 Personal history of nicotine dependence: Secondary | ICD-10-CM | POA: Insufficient documentation

## 2019-03-27 DIAGNOSIS — R319 Hematuria, unspecified: Secondary | ICD-10-CM | POA: Diagnosis present

## 2019-03-27 DIAGNOSIS — Z91040 Latex allergy status: Secondary | ICD-10-CM | POA: Diagnosis not present

## 2019-03-27 DIAGNOSIS — Z79899 Other long term (current) drug therapy: Secondary | ICD-10-CM | POA: Diagnosis not present

## 2019-03-27 DIAGNOSIS — Z7901 Long term (current) use of anticoagulants: Secondary | ICD-10-CM | POA: Insufficient documentation

## 2019-03-27 DIAGNOSIS — I2510 Atherosclerotic heart disease of native coronary artery without angina pectoris: Secondary | ICD-10-CM | POA: Insufficient documentation

## 2019-03-27 DIAGNOSIS — Z466 Encounter for fitting and adjustment of urinary device: Secondary | ICD-10-CM | POA: Diagnosis not present

## 2019-03-27 DIAGNOSIS — Z7982 Long term (current) use of aspirin: Secondary | ICD-10-CM | POA: Insufficient documentation

## 2019-03-27 DIAGNOSIS — I10 Essential (primary) hypertension: Secondary | ICD-10-CM | POA: Diagnosis not present

## 2019-03-27 DIAGNOSIS — Z978 Presence of other specified devices: Secondary | ICD-10-CM

## 2019-03-27 DIAGNOSIS — N3001 Acute cystitis with hematuria: Secondary | ICD-10-CM | POA: Diagnosis not present

## 2019-03-27 LAB — URINALYSIS, ROUTINE W REFLEX MICROSCOPIC
Glucose, UA: >=500 mg/dL — AB
Hgb urine dipstick: NEGATIVE
Ketones, ur: 15 mg/dL — AB
Nitrite: POSITIVE — AB
Protein, ur: >300 mg/dL — AB
Specific Gravity, Urine: 1.01 (ref 1.005–1.030)
pH: 6.5 (ref 5.0–8.0)

## 2019-03-27 LAB — URINALYSIS, MICROSCOPIC (REFLEX)

## 2019-03-27 MED ORDER — CEFTRIAXONE SODIUM 1 G IJ SOLR
1.0000 g | Freq: Once | INTRAMUSCULAR | Status: AC
  Administered 2019-03-27: 1 g via INTRAMUSCULAR
  Filled 2019-03-27: qty 10

## 2019-03-27 MED ORDER — CEPHALEXIN 500 MG PO CAPS: 500.0000 mg | ORAL_CAPSULE | Freq: Four times a day (QID) | ORAL | 0 refills | Status: DC

## 2019-03-27 NOTE — Discharge Instructions (Addendum)
Stop Macrodantin while on Keflex.

## 2019-03-27 NOTE — ED Triage Notes (Addendum)
Pt states she noticed slightly blood tinge urine this am, seen here yesterday for cath insertion , pt took AZO this am

## 2019-03-27 NOTE — ED Provider Notes (Signed)
[facility] EMERGENCY DEPARTMENT Provider Note   CSN: [id number] Arrival date & time: 03/27/19  1133     History   Chief Complaint Chief Complaint  Patient presents with   Hematuria    HPI Sara Villanueva is a 73 y.o. female.     Pt presents to the ED today with blood in urine.  Pt has a hx of bladder outlet dysfunction.  She had been doing self caths, but had to come to the ED yesterday b/c she could not cath herself.  She had a foley catheter placed yesterday.  She noticed some blood in her urine today, so she came back.  She has been taking Azos which have been helping with pain.  She had a UA yesterday which is ok.  She's been taking Macrodantin 50 mg QID for the past 2 weeks to help prevent UTI.  Pt denies f/c.     Past Medical History:  Diagnosis Date   Arthritis    Bladder disorder    CAD (coronary artery disease)    stents x 3, 2013, 2017   Carotid arterial disease (HCC)    High cholesterol    Hypertension    Rheumatoid arteritis (HCC)    Rheumatoid arthritis (HCC)     Patient Active Problem List   Diagnosis Date Noted   Memory loss 03/21/2019   Near syncope 02/25/2019   UTI due to extended-spectrum beta lactamase (ESBL) producing Escherichia coli 02/25/2019   Urinary retention with incomplete bladder emptying 02/25/2019   Knee pain, left 08/05/2015   Allergic rhinitis 07/15/2015   Carotid artery narrowing 07/15/2015   Peripheral vascular disease (HCC) 07/15/2015   Renal artery stenosis (HCC) 07/15/2015   Arterial vascular disease 06/13/2015   Benign essential HTN 06/13/2015   HLD (hyperlipidemia) 06/13/2015   Rheumatoid arthritis (HCC) 06/13/2015   Urgency of micturation 05/03/2015   FOM (frequency of micturition) 05/03/2015   Pedal edema 12/04/2014   Dysesthesia 12/04/2014   Nuclear sclerotic cataract 09/20/2014   Lumbosacral radiculopathy 08/16/2014   Spondylolisthesis at L5-S1 level 08/07/2014   Neck pain  08/07/2014   Chronic rheumatic arthritis (HCC) 08/07/2014   Neuralgia neuritis, sciatic nerve 08/03/2014   Aseptic bony necrosis (HCC) 08/03/2014   Aseptic necrosis of bone (HCC) 08/03/2014    Past Surgical History:  Procedure Laterality Date   CAROTID ENDARTERECTOMY     CORONARY ANGIOPLASTY WITH STENT PLACEMENT       OB History   No obstetric history on file.      Home Medications    Prior to Admission medications   Medication Sig Start Date End Date Taking? Authorizing Provider  aspirin 81 MG tablet Take 81 mg by mouth at bedtime.     [provider]  busPIRone (BUSPAR) 15 MG tablet Take 15 mg by mouth 3 (three) times daily.  08/01/14   [provider]  cephALEXin (KEFLEX) 500 MG capsule Take 1 capsule (500 mg total) by mouth 4 (four) times daily. 03/27/19   Jacalyn Lefevre, MD  clopidogrel (PLAVIX) 75 MG tablet Take 75 mg by mouth daily.    [provider]  donepezil (ARICEPT) 5 MG tablet Take 1 tablet (5 mg total) by mouth at bedtime. 03/21/19   Sater, Pearletha Furl, MD  gabapentin (NEURONTIN) 800 MG tablet TAKE 1 TABLET BY MOUTH THREE TIMES DAILY 01/30/19   Sater, Pearletha Furl, MD  NITROSTAT 0.4 MG SL tablet Place 0.4 mg under the tongue every 5 (five) minutes as needed for chest pain.  07/08/14   [provider]  oxyCODONE-acetaminophen (PERCOCET) 5-325 MG tablet Take 1 tablet by mouth every 6 (six) hours as needed for severe pain. 03/26/19   Charlesetta Shanks, MD  pravastatin (PRAVACHOL) 40 MG tablet Take 40 mg by mouth daily.    [provider]  tamsulosin (FLOMAX) 0.4 MG CAPS capsule Take 1 capsule (0.4 mg total) by mouth daily. 01/19/19   Palumbo, April, MD  trospium (SANCTURA) 20 MG tablet Take 20 mg by mouth at bedtime.    [provider]    Family History Family History  Problem Relation Age of Onset   Heart disease Mother    Heart disease Father     Social History Social History   Tobacco Use   Smoking  status: Former Smoker    Packs/day: 0.50    Years: 25.00    Pack years: 12.50    Types: Cigarettes    Quit date: 2000    Years since quitting: 20.8   Smokeless tobacco: Never Used  Substance Use Topics   Alcohol use: No    Alcohol/week: 0.0 standard drinks   Drug use: No     Allergies   Nickel, Latex, and Lamictal [lamotrigine]   Review of Systems Review of Systems  Genitourinary: Positive for hematuria.  All other systems reviewed and are negative.    Physical Exam Updated Vital Signs BP (!) 112/50    Pulse 78    Temp 98.3 F (36.8 C) (Oral)    Resp 16    Ht 5\' 8"  (1.727 m)    Wt 64 kg    SpO2 99%    BMI 21.45 kg/m   Physical Exam Vitals signs and nursing note reviewed.  Constitutional:      Appearance: Normal appearance.  HENT:     Head: Normocephalic and atraumatic.     Right Ear: External ear normal.     Left Ear: External ear normal.     Nose: Nose normal.     Mouth/Throat:     Mouth: Mucous membranes are moist.     Pharynx: Oropharynx is clear.  Eyes:     Extraocular Movements: Extraocular movements intact.     Conjunctiva/sclera: Conjunctivae normal.     Pupils: Pupils are equal, round, and reactive to light.  Neck:     Musculoskeletal: Normal range of motion and neck supple.  Cardiovascular:     Rate and Rhythm: Normal rate and regular rhythm.     Pulses: Normal pulses.     Heart sounds: Normal heart sounds.  Pulmonary:     Effort: Pulmonary effort is normal.     Breath sounds: Normal breath sounds.  Abdominal:     General: Abdomen is flat. Bowel sounds are normal.     Palpations: Abdomen is soft.  Genitourinary:    Comments: Foley catheter draining well.  Color is orange. Musculoskeletal: Normal range of motion.  Skin:    General: Skin is warm.     Capillary Refill: Capillary refill takes less than 2 seconds.  Neurological:     General: No focal deficit present.     Mental Status: She is alert and oriented to person, place, and time.    Psychiatric:        Mood and Affect: Mood normal.        Behavior: Behavior normal.      ED Treatments / Results  Labs (all labs ordered are listed, but only abnormal results are displayed) Labs Reviewed  URINALYSIS, ROUTINE W REFLEX  MICROSCOPIC - Abnormal; Notable for the following components:      Result Value   Color, Urine ORANGE (*)    Glucose, UA >=500 (*)    Bilirubin Urine SMALL (*)    Ketones, ur 15 (*)    Protein, ur >300 (*)    Nitrite POSITIVE (*)    Leukocytes,Ua MODERATE (*)    All other components within normal limits  URINALYSIS, MICROSCOPIC (REFLEX) - Abnormal; Notable for the following components:   Bacteria, UA FEW (*)    All other components within normal limits  URINE CULTURE    EKG None  Radiology [place] Aorta  Result Date: 03/26/2019 CLINICAL DATA:  Pain and hypotension EXAM: ULTRASOUND OF ABDOMINAL AORTA TECHNIQUE: Ultrasound examination of the abdominal aorta and proximal common iliac arteries was performed to evaluate for aneurysm. Additional color and Doppler images of the distal aorta were obtained to document patency. COMPARISON:  None. FINDINGS: Abdominal aortic measurements as follows: Proximal:  2.7 x 2.8 cm Mid:  2.2 x 1.7 cm Distal:  2.2 x 2.5 cm Patent: Yes, peak systolic velocity is 100 cm/s. Extensive atherosclerotic changes are visualized. Right common iliac artery: 0.8 x 0.8 cm Left common iliac artery: 0.8 x 0.8 cm IMPRESSION: Ectatic abdominal aorta at risk for aneurysm development. Recommend followup by ultrasound in 5 years. This recommendation follows ACR consensus guidelines: White Paper of the ACR Incidental Findings Committee II on Vascular Findings. J Am Coll Radiol 2013; 10:789-794. Aortic aneurysm NOS (ICD10-I71.9) Electronically Signed   By: Romona Curlsyler  Litton M.D.   On: 03/26/2019 11:30   Ct Angio Chest/abd/pel For Dissection W And/or W/wo  Result Date: 03/26/2019 CLINICAL DATA:  Chest pain and abdominal pain EXAM: CT ANGIOGRAPHY  CHEST, ABDOMEN AND PELVIS TECHNIQUE: Multidetector CT imaging through the chest, abdomen and pelvis was performed using the standard protocol during bolus administration of intravenous contrast. Multiplanar reconstructed images and MIPs were obtained and reviewed to evaluate the vascular anatomy. CONTRAST:  100mL OMNIPAQUE IOHEXOL 350 MG/ML SOLN COMPARISON:  CT abdomen pelvis dated 12/31/2018 and CT chest dated 09/20/2012. FINDINGS: CTA CHEST FINDINGS Cardiovascular: Preferential opacification of the thoracic aorta. No evidence of thoracic aortic aneurysm or dissection. No evidence of pulmonary embolism. The right pulmonary artery measures 2.6 cm in diameter, which may reflect pulmonary hypertension. The heart is mildly enlarged. No pericardial effusion. Mediastinum/Nodes: No enlarged mediastinal, hilar, or axillary lymph nodes. Thyroid gland, trachea, and esophagus demonstrate no significant findings. Lungs/Pleura: Right upper lobe predominant scar Zettie CooleyLing is not significantly changed and likely post infectious/inflammatory. Mild centrilobular and paraseptal emphysematous changes are seen. There is no pleural effusion or pneumothorax. Musculoskeletal: No chest wall abnormality. No acute or significant osseous findings. Review of the MIP images confirms the above findings. CTA ABDOMEN AND PELVIS FINDINGS VASCULAR Aorta: Atherosclerosis without aneurysm, dissection, vasculitis or significant stenosis. Celiac: Atherosclerosis with greater than 50% stenosis at its origin. SMA: Atherosclerosis with approximately 50% stenosis at its origin. Renals: Patent renal artery stents. IMA: Atherosclerosis evidence of aneurysm, dissection, vasculitis or significant stenosis. Inflow: Bi-iliac stents are patent. Veins: No obvious venous abnormality within the limitations of this arterial phase study. Review of the MIP images confirms the above findings. NON-VASCULAR Hepatobiliary: No focal liver abnormality is seen. No gallstones,  gallbladder wall thickening, or biliary dilatation. Pancreas: Unremarkable. No pancreatic ductal dilatation or surrounding inflammatory changes. Spleen: Normal in size without focal abnormality. Adrenals/Urinary Tract: Adrenal glands are unremarkable. Other than a 1.6 cm left renal cyst and a right renal atrophy, the kidneys  are normal, without renal calculi, focal lesion, or hydronephrosis. Bladder is unremarkable. Stomach/Bowel: Stomach is within normal limits. No pericecal inflammatory changes are identified to suggest acute appendicitis. No evidence of bowel wall thickening, distention, or inflammatory changes. Lymphatic: No enlarged abdominal or pelvic lymph nodes. Reproductive: Uterus and bilateral adnexa are unremarkable. Other: No abdominal wall hernia or abnormality. No abdominopelvic ascites. Musculoskeletal: There is grade 1 anterolisthesis of L4 on L5. There is sclerosis of the superior aspect of both femoral heads which may reflect avascular necrosis. Review of the MIP images confirms the above findings. IMPRESSION: 1. No evidence of aortic dissection, aneurysm, or intramural hematoma. No evidence of pulmonary embolism. No acute findings are evident in the chest, abdomen or pelvis. 2. 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 Aortic Atherosclerosis (ICD10-I70.0). Electronically Signed   By: Romona Curls M.D.   On: 03/26/2019 12:00    Procedures Procedures (including critical care time)  Medications Ordered in ED Medications  cefTRIAXone (ROCEPHIN) injection 1 g (has no administration in time range)     Initial Impression / Assessment and Plan / ED Course  I have reviewed the triage vital signs and the nursing notes.  Pertinent labs & imaging results that were available during my care of the patient were reviewed by me and considered in my medical decision making (see chart for details).       Blood could be from traumatic foley  placement, but she has now developed + nitrite and LE.  She will be given a dose of rocephin here and d/c home with keflex.  Urine will be sent for cx.  She has an appt with urology on 11/4.  Return if worse.  Final Clinical Impressions(s) / ED Diagnoses   Final diagnoses:  Acute cystitis with hematuria  Foley catheter in place    ED Discharge Orders         Ordered    cephALEXin (KEFLEX) 500 MG capsule  4 times daily     03/27/19 1237           Jacalyn Lefevre, MD 03/27/19 1237

## 2019-03-28 LAB — URINE CULTURE: Culture: NO GROWTH

## 2019-05-12 DIAGNOSIS — I5022 Chronic systolic (congestive) heart failure: Secondary | ICD-10-CM | POA: Insufficient documentation

## 2019-05-19 ENCOUNTER — Other Ambulatory Visit: Payer: Self-pay

## 2019-05-19 ENCOUNTER — Emergency Department (HOSPITAL_BASED_OUTPATIENT_CLINIC_OR_DEPARTMENT_OTHER)
Admission: EM | Admit: 2019-05-19 | Discharge: 2019-05-19 | Disposition: A | Discharge status: Home / Self Care | Payer: Medicare Other | Attending: Emergency Medicine | Admitting: Emergency Medicine

## 2019-05-19 ENCOUNTER — Emergency Department (HOSPITAL_BASED_OUTPATIENT_CLINIC_OR_DEPARTMENT_OTHER): Payer: Medicare Other

## 2019-05-19 ENCOUNTER — Encounter (HOSPITAL_BASED_OUTPATIENT_CLINIC_OR_DEPARTMENT_OTHER): Payer: Self-pay | Admitting: Emergency Medicine

## 2019-05-19 DIAGNOSIS — I110 Hypertensive heart disease with heart failure: Secondary | ICD-10-CM | POA: Insufficient documentation

## 2019-05-19 DIAGNOSIS — Z91040 Latex allergy status: Secondary | ICD-10-CM | POA: Diagnosis not present

## 2019-05-19 DIAGNOSIS — Z79899 Other long term (current) drug therapy: Secondary | ICD-10-CM | POA: Insufficient documentation

## 2019-05-19 DIAGNOSIS — Z87891 Personal history of nicotine dependence: Secondary | ICD-10-CM | POA: Insufficient documentation

## 2019-05-19 DIAGNOSIS — R1031 Right lower quadrant pain: Secondary | ICD-10-CM | POA: Diagnosis not present

## 2019-05-19 DIAGNOSIS — Z7982 Long term (current) use of aspirin: Secondary | ICD-10-CM | POA: Insufficient documentation

## 2019-05-19 DIAGNOSIS — I2510 Atherosclerotic heart disease of native coronary artery without angina pectoris: Secondary | ICD-10-CM | POA: Diagnosis not present

## 2019-05-19 DIAGNOSIS — I509 Heart failure, unspecified: Secondary | ICD-10-CM | POA: Diagnosis not present

## 2019-05-19 HISTORY — DX: Polyp of colon: K63.5

## 2019-05-19 HISTORY — DX: Heart failure, unspecified: I50.9

## 2019-05-19 HISTORY — DX: Gastrointestinal hemorrhage, unspecified: K92.2

## 2019-05-19 LAB — CBC
HCT: 40.1 % (ref 36.0–46.0)
Hemoglobin: 12.1 g/dL (ref 12.0–15.0)
MCH: 27.1 pg (ref 26.0–34.0)
MCHC: 30.2 g/dL (ref 30.0–36.0)
MCV: 89.7 fL (ref 80.0–100.0)
Platelets: 300 10*3/uL (ref 150–400)
RBC: 4.47 MIL/uL (ref 3.87–5.11)
RDW: 20.2 % — ABNORMAL HIGH (ref 11.5–15.5)
WBC: 4.9 10*3/uL (ref 4.0–10.5)
nRBC: 0 % (ref 0.0–0.2)

## 2019-05-19 LAB — COMPREHENSIVE METABOLIC PANEL
ALT: 37 U/L (ref 0–44)
AST: 32 U/L (ref 15–41)
Albumin: 3.5 g/dL (ref 3.5–5.0)
Alkaline Phosphatase: 97 U/L (ref 38–126)
Anion gap: 9 (ref 5–15)
BUN: 12 mg/dL (ref 8–23)
CO2: 22 mmol/L (ref 22–32)
Calcium: 9.3 mg/dL (ref 8.9–10.3)
Chloride: 104 mmol/L (ref 98–111)
Creatinine, Ser: 0.83 mg/dL (ref 0.44–1.00)
GFR calc Af Amer: >60 mL/min (ref >60)
GFR calc non Af Amer: >60 mL/min (ref >60)
Glucose, Bld: 86 mg/dL (ref 70–99)
Potassium: 3.8 mmol/L (ref 3.5–5.1)
Sodium: 135 mmol/L (ref 135–145)
Total Bilirubin: 0.5 mg/dL (ref 0.3–1.2)
Total Protein: 8.1 g/dL (ref 6.5–8.1)

## 2019-05-19 LAB — URINALYSIS, MICROSCOPIC (REFLEX)

## 2019-05-19 LAB — URINALYSIS, ROUTINE W REFLEX MICROSCOPIC
Bilirubin Urine: NEGATIVE
Glucose, UA: NEGATIVE mg/dL
Ketones, ur: NEGATIVE mg/dL
Nitrite: NEGATIVE
Protein, ur: NEGATIVE mg/dL
Specific Gravity, Urine: 1.01 (ref 1.005–1.030)
pH: 6 (ref 5.0–8.0)

## 2019-05-19 LAB — OCCULT BLOOD X 1 CARD TO LAB, STOOL: Fecal Occult Bld: NEGATIVE

## 2019-05-19 LAB — LIPASE, BLOOD: Lipase: 25 U/L (ref 11–51)

## 2019-05-19 MED ORDER — IOHEXOL 300 MG/ML  SOLN
100.0000 mL | Freq: Once | INTRAMUSCULAR | Status: AC | PRN
  Administered 2019-05-19: 100 mL via INTRAVENOUS

## 2019-05-19 MED ORDER — MORPHINE SULFATE (PF) 4 MG/ML IV SOLN
4.0000 mg | Freq: Once | INTRAVENOUS | Status: AC
  Administered 2019-05-19: 4 mg via INTRAVENOUS
  Filled 2019-05-19: qty 1

## 2019-05-19 MED ORDER — HYDROCODONE-ACETAMINOPHEN 5-325 MG PO TABS: 1.0000 | ORAL_TABLET | ORAL | 0 refills | Status: DC | PRN

## 2019-05-19 NOTE — ED Provider Notes (Signed)
Pt is signed out by Dr. Johnney Killian.  Pt is feeling much better.  CT shows nothing acute other than mild edema.  Pt is stable for d/c. Return if worse.    IMPRESSION:  1. Normal appendix. No acute or inflammatory process identified in  the abdomen or pelvis.  2. New small layering pleural effusions since October. Mild lung  base atelectasis, mild interstitial edema not excluded. Stable  cardiomegaly.  3. Advanced Aortic atherosclerosis (ICD10-I70.0). Chronic bilateral  renal artery and iliac artery stents remain patent.  4. Mild chronic right renal atrophy.        Isla Pence, MD 05/19/19 [phone]

## 2019-05-19 NOTE — ED Provider Notes (Signed)
[facility] EMERGENCY DEPARTMENT Provider Note   CSN: [id number] Arrival date & time: 05/19/19  1148     History Chief Complaint  Patient presents with  . Abdominal Pain    Corrie Reder is a 73 y.o. female.  HPI Patient reports that she had sudden onset of severe right lower quadrant pain yesterday evening.  It came on pretty quickly.  She reports that she had a hard time all night because she could not sleep due to pain.  She is noticing from her central lower abdomen over towards the right.  She reports she takes MiraLAX regularly and does have regular bowel movements although they seem kind of thin.  No vomiting.  She reports that eating does seem to make the pain slightly worse.  No weakness or numbness in her legs.  Patient reports she does have a history of "leaky blood vessel".  She is supposed to be seeing a gastroenterologist in early January to have a procedural intervention done.  She reports that her stool is frequently quite dark in appearance.  She is says it has not been bloody in appearance.  Yesterday she felt a little weak and lightheaded.  She has not had any syncopal episode.    Past Medical History:  Diagnosis Date  . Arthritis   . Bladder disorder   . CAD (coronary artery disease)    stents x 3, 2013, 2017  . Carotid arterial disease (HCC)   . CHF (congestive heart failure) (HCC)   . Colon polyps   . High cholesterol   . Hypertension   . Lower GI bleed   . Rheumatoid arteritis (HCC)   . Rheumatoid arthritis [facility])     Patient Active Problem List   Diagnosis Date Noted  . Chronic systolic congestive heart failure (HCC) 05/12/2019  . Memory loss 03/21/2019  . Near syncope 02/25/2019  . UTI due to extended-spectrum beta lactamase (ESBL) producing Escherichia coli 02/25/2019  . Urinary retention with incomplete bladder emptying 02/25/2019  . Voiding dysfunction 02/14/2019  . Venous insufficiency (chronic) (peripheral) 12/08/2018  . 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 Mild cognitive impairment with memory loss 09/21/2018  . Acute cystitis without hematuria 09/09/2018  . Heme positive stool 03/28/2018  . S/P carotid endarterectomy 12/27/2017  . Chronic constipation 10/18/2017  . History of colon polyps 10/18/2017  . 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 Acute recurrent sinusitis 03/02/2017  . Anemia, unspecified 01/31/2016  . Knee pain, left 08/05/2015  . Allergic rhinitis 07/15/2015  . Stenosis of carotid artery 07/15/2015  . PVD (peripheral vascular disease) (HCC) 07/15/2015  . Renal artery stenosis (HCC) 07/15/2015  . Bilateral carotid artery stenosis 07/15/2015  . 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 Essential (primary) hypertension 06/13/2015  . HLD (hyperlipidemia) 06/13/2015  . Urgency of micturation 05/03/2015  . Urination frequency 05/03/2015  . Pedal edema 12/04/2014  . Dysesthesia 12/04/2014  . Nuclear sclerotic cataract 09/20/2014  . Lumbar radiculopathy 08/16/2014  . Spondylolisthesis at L5-S1 level 08/07/2014  . Neck pain 08/07/2014  . Chronic rheumatic arthritis (HCC) 08/07/2014  . Rheumatoid arthritis (HCC) 08/07/2014  . Angina pectoris (HCC) 08/03/2014  . Aseptic bony necrosis (HCC) 08/03/2014  . Aseptic necrosis of bone (HCC) 08/03/2014    Past Surgical History:  Procedure Laterality Date  . CAROTID ENDARTERECTOMY    . CORONARY ANGIOPLASTY WITH STENT PLACEMENT       OB History   No obstetric history on file.  Family History  Problem Relation Age of Onset  . Heart disease Mother   . Heart disease Father     Social History   Tobacco Use  . Smoking status: Former Smoker    Packs/day: 0.50    Years: 25.00    Pack years: 12.50    Types: Cigarettes    Quit date: 2000    Years since quitting: 20.9  . Smokeless tobacco: Never Used  Substance Use Topics  . Alcohol use: No    Alcohol/week: 0.0 standard drinks  . Drug use: No     Home Medications Prior to Admission medications   Medication Sig Start Date End Date Taking? Authorizing Provider  aspirin 81 MG tablet Take 81 mg by mouth at bedtime.     [provider]  busPIRone (BUSPAR) 15 MG tablet Take 15 mg by mouth 3 (three) times daily.  08/01/14   [provider]  cephALEXin (KEFLEX) 500 MG capsule Take 1 capsule (500 mg total) by mouth 4 (four) times daily. 03/27/19   Jacalyn Lefevre, MD  clopidogrel (PLAVIX) 75 MG tablet Take 75 mg by mouth daily.    [provider]  donepezil (ARICEPT) 5 MG tablet Take 1 tablet (5 mg total) by mouth at bedtime. 03/21/19   Sater, Pearletha Furl, MD  gabapentin (NEURONTIN) 800 MG tablet TAKE 1 TABLET BY MOUTH THREE TIMES DAILY 01/30/19   Sater, Pearletha Furl, MD  NITROSTAT 0.4 MG SL tablet Place 0.4 mg under the tongue every 5 (five) minutes as needed for chest pain.  07/08/14   [provider]  oxyCODONE-acetaminophen (PERCOCET) 5-325 MG tablet Take 1 tablet by mouth every 6 (six) hours as needed for severe pain. 03/26/19   Arby Barrette, MD  pravastatin (PRAVACHOL) 40 MG tablet Take 40 mg by mouth daily.    [provider]  tamsulosin (FLOMAX) 0.4 MG CAPS capsule Take 1 capsule (0.4 mg total) by mouth daily. 01/19/19   Palumbo, April, MD  trospium (SANCTURA) 20 MG tablet Take 20 mg by mouth at bedtime.    [provider]    Allergies    Lamotrigine, Latex, and Nickel  Review of Systems   Review of Systems 10 Systems reviewed and are negative for acute change except as noted in the HPI. Physical Exam Updated Vital Signs BP 108/87   Pulse 73   Temp 98.3 F (36.8 C) (Oral)   Resp 20   Ht 5\' 8"  (1.727 m)   Wt 68.5 kg   SpO2 98%   BMI 22.96 kg/m   Physical Exam Constitutional:      Comments: Patient is alert and appropriate.  No acute distress.  No respiratory distress.  HENT:     Head: Normocephalic and atraumatic.  Cardiovascular:     Rate and Rhythm: Normal rate and  regular rhythm.     Pulses: Normal pulses.  Pulmonary:     Effort: Pulmonary effort is normal.     Breath sounds: Normal breath sounds.  Abdominal:     Comments: Abdomen soft no guarding.  Patient presents tenderness in the suprapubic area and right lower quadrant.  I cannot appreciate significant mass or distention.  Genitourinary:    Comments: Rectal exam: The rectal vault is empty.  No stool present.  No blood present. Musculoskeletal:        General: No swelling or tenderness. Normal range of motion.     Cervical back: Neck supple.  Skin:    General: Skin is warm and dry.  Neurological:     General: No focal deficit present.     Mental Status: She is oriented to person, place, and time.     Coordination: Coordination normal.  Psychiatric:        Mood and Affect: Mood normal.     ED Results / Procedures / Treatments   Labs (all labs ordered are listed, but only abnormal results are displayed) Labs Reviewed  CBC - Abnormal; Notable for the following components:      Result Value   RDW 20.2 (*)    All other components within normal limits  URINALYSIS, ROUTINE W REFLEX MICROSCOPIC - Abnormal; Notable for the following components:   Hgb urine dipstick TRACE (*)    Leukocytes,Ua SMALL (*)    All other components within normal limits  URINALYSIS, MICROSCOPIC (REFLEX) - Abnormal; Notable for the following components:   Bacteria, UA FEW (*)    All other components within normal limits  LIPASE, BLOOD  COMPREHENSIVE METABOLIC PANEL    EKG None  Radiology No results found.  Procedures Procedures (including critical care time)  Medications Ordered in ED Medications  morphine 4 MG/ML injection 4 mg (4 mg Intravenous Given 05/19/19 1326)    ED Course  I have reviewed the triage vital signs and the nursing notes.  Pertinent labs & imaging results that were available during my care of the patient were reviewed by me and considered in my medical decision making (see chart  for details).    MDM Rules/Calculators/A&P                      Patient presents as outlined above with some acute onset of fairly severe pain late yesterday evening.  Patient reports he was in pain all night long.  She is nontoxic and alert.  Vital signs are stable.  Patient has gotten adequate pain control with morphine.  Rectal exam reveals no stool in the vault and no visible blood.  Hemoglobin is stable.  Patient was evaluated by myself in October with onset of severe pain and at that time had urinary retention.  Vascular emergency was ruled out at that time.  I have much lower suspicion for vascular emergency today based on the patient's presentation.  He does have persisting right lower pain.  Will repeat CT scan for possible appendicitis or kidney stone.  Otherwise, patient is well in appearance at this time without clinical appearance of infectious etiology or vascular emergency. Final Clinical Impression(s) / ED Diagnoses Final diagnoses:  None    Rx / DC Orders ED Discharge Orders    None       Charlesetta Shanks, MD 05/19/19 1458

## 2019-05-19 NOTE — ED Notes (Signed)
IV attempted x2 without success.

## 2019-05-19 NOTE — ED Triage Notes (Signed)
Right lower abdominal pain to right flank pain.  Pt states she has "leaky blood vessels".  Pt does have gi bleeding history and is to have a procedure done in January.  No N/V  Does have dark stools.

## 2019-05-19 NOTE — ED Notes (Signed)
ED Provider at bedside. 

## 2019-07-02 ENCOUNTER — Encounter (HOSPITAL_BASED_OUTPATIENT_CLINIC_OR_DEPARTMENT_OTHER): Payer: Self-pay | Admitting: Emergency Medicine

## 2019-07-02 ENCOUNTER — Other Ambulatory Visit: Payer: Self-pay

## 2019-07-02 ENCOUNTER — Emergency Department (HOSPITAL_BASED_OUTPATIENT_CLINIC_OR_DEPARTMENT_OTHER)
Admission: EM | Admit: 2019-07-02 | Discharge: 2019-07-02 | Disposition: A | Discharge status: Home / Self Care | Payer: Medicare Other | Attending: Emergency Medicine | Admitting: Emergency Medicine

## 2019-07-02 DIAGNOSIS — Z87891 Personal history of nicotine dependence: Secondary | ICD-10-CM | POA: Diagnosis not present

## 2019-07-02 DIAGNOSIS — Z888 Allergy status to other drugs, medicaments and biological substances status: Secondary | ICD-10-CM | POA: Insufficient documentation

## 2019-07-02 DIAGNOSIS — Z7982 Long term (current) use of aspirin: Secondary | ICD-10-CM | POA: Insufficient documentation

## 2019-07-02 DIAGNOSIS — M069 Rheumatoid arthritis, unspecified: Secondary | ICD-10-CM | POA: Diagnosis not present

## 2019-07-02 DIAGNOSIS — J0100 Acute maxillary sinusitis, unspecified: Secondary | ICD-10-CM | POA: Diagnosis not present

## 2019-07-02 DIAGNOSIS — I110 Hypertensive heart disease with heart failure: Secondary | ICD-10-CM | POA: Insufficient documentation

## 2019-07-02 DIAGNOSIS — Z91040 Latex allergy status: Secondary | ICD-10-CM | POA: Insufficient documentation

## 2019-07-02 DIAGNOSIS — Z79899 Other long term (current) drug therapy: Secondary | ICD-10-CM | POA: Diagnosis not present

## 2019-07-02 DIAGNOSIS — I5022 Chronic systolic (congestive) heart failure: Secondary | ICD-10-CM | POA: Insufficient documentation

## 2019-07-02 DIAGNOSIS — E782 Mixed hyperlipidemia: Secondary | ICD-10-CM | POA: Insufficient documentation

## 2019-07-02 DIAGNOSIS — Z20822 Contact with and (suspected) exposure to covid-19: Secondary | ICD-10-CM | POA: Insufficient documentation

## 2019-07-02 DIAGNOSIS — R0981 Nasal congestion: Secondary | ICD-10-CM | POA: Diagnosis present

## 2019-07-02 LAB — SARS CORONAVIRUS 2 (TAT 6-24 HRS): SARS Coronavirus 2: NEGATIVE

## 2019-07-02 MED ORDER — AMOXICILLIN 500 MG PO CAPS: 500.0000 mg | ORAL_CAPSULE | Freq: Three times a day (TID) | ORAL | 0 refills | Status: DC

## 2019-07-02 MED ORDER — SALINE SPRAY 0.65 % NA SOLN
1.0000 | NASAL | Status: DC | PRN
  Administered 2019-07-02: 1 via NASAL
  Filled 2019-07-02: qty 44

## 2019-07-02 NOTE — ED Triage Notes (Signed)
Pt states that she started to have a "cold" on tues. She reports now she is having generalized weakness and sinus headache and congestion

## 2019-07-02 NOTE — Discharge Instructions (Signed)
Before you start the antibiotic wait until your Covid test comes back.  If your Covid test is positive you do not need to take the antibiotic because your symptoms should get better with time and there is no specific medicine.  However if your Covid test is negative then start the antibiotic for sinus infection.  In the meantime you can take Tylenol for the headaches or the Alka-Seltzer you have been using.  Also you can do saline nasal spray to help drain your sinuses or the Nettie pot

## 2019-07-02 NOTE — ED Provider Notes (Signed)
[facility] EMERGENCY DEPARTMENT Provider Note   CSN: [id number] Arrival date & time: 07/02/19  1026     History Chief Complaint  Patient presents with  . Nasal Congestion    Sara Villanueva is a 74 y.o. female.  Patient is a 74 year old female with a history of hypertension, rheumatoid arthritis, CHF, CAD who is presenting today with 6 days of worsening nasal congestion, facial pain and headache.  All of her symptoms started on Tuesday initially with a runny nose.  She states until today it was just intermittent congestion and occasional headache but today she woke up with the facial pain.  She has not had fever, cough, shortness of breath.  No vomiting or diarrhea.  The facial pain is under her eyes and in her upper teeth.  She has been taking Alka-Seltzer at home which had seemed to help but she did not take any today.  She has no known Covid contacts and takes precautions both at home and when she goes to the doctor.  The history is provided by the patient.       Past Medical History:  Diagnosis Date  . Arthritis   . Bladder disorder   . CAD (coronary artery disease)    stents x 3, 2013, 2017  . Carotid arterial disease (HCC)   . CHF (congestive heart failure) (HCC)   . Colon polyps   . High cholesterol   . Hypertension   . Lower GI bleed   . Rheumatoid arteritis (HCC)   . Rheumatoid arthritis [facility])     Patient Active Problem List   Diagnosis Date Noted  . Chronic systolic congestive heart failure (HCC) 05/12/2019  . Memory loss 03/21/2019  . Near syncope 02/25/2019  . UTI due to extended-spectrum beta lactamase (ESBL) producing Escherichia coli 02/25/2019  . Urinary retention with incomplete bladder emptying 02/25/2019  . Voiding dysfunction 02/14/2019  . Venous insufficiency (chronic) (peripheral) 12/08/2018  . 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 Mild cognitive impairment with memory loss 09/21/2018  . Acute cystitis without  hematuria 09/09/2018  . Heme positive stool 03/28/2018  . S/P carotid endarterectomy 12/27/2017  . Chronic constipation 10/18/2017  . History of colon polyps 10/18/2017  . 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 Acute recurrent sinusitis 03/02/2017  . Anemia, unspecified 01/31/2016  . Knee pain, left 08/05/2015  . Allergic rhinitis 07/15/2015  . Stenosis of carotid artery 07/15/2015  . PVD (peripheral vascular disease) (HCC) 07/15/2015  . Renal artery stenosis (HCC) 07/15/2015  . Bilateral carotid artery stenosis 07/15/2015  . 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 Essential (primary) hypertension 06/13/2015  . HLD (hyperlipidemia) 06/13/2015  . Urgency of micturation 05/03/2015  . Urination frequency 05/03/2015  . Pedal edema 12/04/2014  . Dysesthesia 12/04/2014  . Nuclear sclerotic cataract 09/20/2014  . Lumbar radiculopathy 08/16/2014  . Spondylolisthesis at L5-S1 level 08/07/2014  . Neck pain 08/07/2014  . Chronic rheumatic arthritis (HCC) 08/07/2014  . Rheumatoid arthritis (HCC) 08/07/2014  . Angina pectoris (HCC) 08/03/2014  . Aseptic bony necrosis (HCC) 08/03/2014  . Aseptic necrosis of bone (HCC) 08/03/2014    Past Surgical History:  Procedure Laterality Date  . CAROTID ENDARTERECTOMY    . CORONARY ANGIOPLASTY WITH STENT PLACEMENT       OB History   No obstetric history on file.     Family History  Problem Relation Age of Onset  . Heart disease Mother   . Heart disease Father  Social History   Tobacco Use  . Smoking status: Former Smoker    Packs/day: 0.50    Years: 25.00    Pack years: 12.50    Types: Cigarettes    Quit date: 2000    Years since quitting: 21.0  . Smokeless tobacco: Never Used  Substance Use Topics  . Alcohol use: No    Alcohol/week: 0.0 standard drinks  . Drug use: No    Home Medications Prior to Admission medications   Medication Sig Start Date End Date Taking? Authorizing Provider  amoxicillin  (AMOXIL) 500 MG capsule Take 1 capsule (500 mg total) by mouth 3 (three) times daily. 07/02/19   Blanchie Dessert, MD  aspirin 81 MG tablet Take 81 mg by mouth at bedtime.     [provider]  busPIRone (BUSPAR) 15 MG tablet Take 15 mg by mouth 3 (three) times daily.  08/01/14   [provider]  cephALEXin (KEFLEX) 500 MG capsule Take 1 capsule (500 mg total) by mouth 4 (four) times daily. 03/27/19   Isla Pence, MD  clopidogrel (PLAVIX) 75 MG tablet Take 75 mg by mouth daily.    [provider]  donepezil (ARICEPT) 5 MG tablet Take 1 tablet (5 mg total) by mouth at bedtime. 03/21/19   Sater, Nanine Means, MD  gabapentin (NEURONTIN) 800 MG tablet TAKE 1 TABLET BY MOUTH THREE TIMES DAILY 01/30/19   Sater, Nanine Means, MD  HYDROcodone-acetaminophen (NORCO/VICODIN) 5-325 MG tablet Take 1 tablet by mouth every 4 (four) hours as needed. 05/19/19   Isla Pence, MD  NITROSTAT 0.4 MG SL tablet Place 0.4 mg under the tongue every 5 (five) minutes as needed for chest pain.  07/08/14   [provider]  oxyCODONE-acetaminophen (PERCOCET) 5-325 MG tablet Take 1 tablet by mouth every 6 (six) hours as needed for severe pain. 03/26/19   Charlesetta Shanks, MD  pravastatin (PRAVACHOL) 40 MG tablet Take 40 mg by mouth daily.    [provider]  tamsulosin (FLOMAX) 0.4 MG CAPS capsule Take 1 capsule (0.4 mg total) by mouth daily. 01/19/19   Palumbo, April, MD  trospium (SANCTURA) 20 MG tablet Take 20 mg by mouth at bedtime.    [provider]    Allergies    Lamotrigine, Latex, and Nickel  Review of Systems   Review of Systems  All other systems reviewed and are negative.   Physical Exam Updated Vital Signs BP 126/87 (BP Location: Left Arm)   Pulse 85   Temp 98.6 F (37 C) (Oral)   Resp 18   Ht 5\' 8"  (1.727 m)   Wt 62.6 kg   SpO2 100%   BMI 20.98 kg/m   Physical Exam Vitals and nursing note reviewed.  Constitutional:      General: She is not in  acute distress.    Appearance: Normal appearance. She is normal weight.  HENT:     Head: Normocephalic.     Right Ear: Tympanic membrane normal.     Left Ear: Tympanic membrane normal.     Nose: Mucosal edema and congestion present.     Right Sinus: Maxillary sinus tenderness present. No frontal sinus tenderness.     Left Sinus: Maxillary sinus tenderness present. No frontal sinus tenderness.     Mouth/Throat:     Mouth: Mucous membranes are moist.  Eyes:     Pupils: Pupils are equal, round, and reactive to light.  Cardiovascular:     Rate and Rhythm: Normal rate.  Pulses: Normal pulses.     Heart sounds: No murmur.  Pulmonary:     Effort: Pulmonary effort is normal. No respiratory distress.     Breath sounds: Normal breath sounds. No wheezing or rales.  Musculoskeletal:     Cervical back: Normal range of motion.  Skin:    General: Skin is warm and dry.     Capillary Refill: Capillary refill takes less than 2 seconds.  Neurological:     General: No focal deficit present.     Mental Status: She is alert. Mental status is at baseline.  Psychiatric:        Mood and Affect: Mood normal.        Behavior: Behavior normal.     ED Results / Procedures / Treatments   Labs (all labs ordered are listed, but only abnormal results are displayed) Labs Reviewed  SARS CORONAVIRUS 2 (TAT 6-24 HRS)    EKG None  Radiology No results found.  Procedures Procedures (including critical care time)  Medications Ordered in ED Medications  sodium chloride (OCEAN) 0.65 % nasal spray 1 spray (has no administration in time range)    ED Course  I have reviewed the triage vital signs and the nursing notes.  Pertinent labs & imaging results that were available during my care of the patient were reviewed by me and considered in my medical decision making (see chart for details).    MDM Rules/Calculators/A&P                     Elderly female presenting today with symptoms most classic  for sinusitis.  She has maxillary facial pain, nasal congestion and headache.  She denies any fever, shortness of breath or cough.  Also this could be Covid-like illness.  Patient is otherwise well-appearing with normal vital signs.  Discussed with patient we will do a Covid test.  If her Covid test is positive she will continue supportive care however if her Covid test comes back negative an antibiotic was sent to her pharmacy to treat sinusitis.  Patient was instructed to use Tylenol, Ocean nasal spray or sinus rinses.  Final Clinical Impression(s) / ED Diagnoses Final diagnoses:  Acute non-recurrent maxillary sinusitis    Rx / DC Orders ED Discharge Orders         Ordered    amoxicillin (AMOXIL) 500 MG capsule  3 times daily     07/02/19 1048           Gwyneth Sprout, MD 07/02/19 1054

## 2019-07-03 ENCOUNTER — Telehealth: Payer: Self-pay

## 2019-07-03 NOTE — Telephone Encounter (Signed)
Pt. Calling to report she was seen at [facility] yesterday and "I need them to release my antibiotic at the pharmacy for me." Pt. Given their phone number.

## 2019-08-26 ENCOUNTER — Other Ambulatory Visit: Payer: Self-pay

## 2019-08-26 ENCOUNTER — Encounter (HOSPITAL_BASED_OUTPATIENT_CLINIC_OR_DEPARTMENT_OTHER): Payer: Self-pay

## 2019-08-26 ENCOUNTER — Emergency Department (HOSPITAL_BASED_OUTPATIENT_CLINIC_OR_DEPARTMENT_OTHER)
Admission: EM | Admit: 2019-08-26 | Discharge: 2019-08-26 | Disposition: A | Discharge status: Home / Self Care | Payer: Medicare Other | Attending: Emergency Medicine | Admitting: Emergency Medicine

## 2019-08-26 DIAGNOSIS — I5022 Chronic systolic (congestive) heart failure: Secondary | ICD-10-CM | POA: Diagnosis not present

## 2019-08-26 DIAGNOSIS — I2510 Atherosclerotic heart disease of native coronary artery without angina pectoris: Secondary | ICD-10-CM | POA: Insufficient documentation

## 2019-08-26 DIAGNOSIS — Z9861 Coronary angioplasty status: Secondary | ICD-10-CM | POA: Diagnosis not present

## 2019-08-26 DIAGNOSIS — Z79899 Other long term (current) drug therapy: Secondary | ICD-10-CM | POA: Diagnosis not present

## 2019-08-26 DIAGNOSIS — Z7982 Long term (current) use of aspirin: Secondary | ICD-10-CM | POA: Diagnosis not present

## 2019-08-26 DIAGNOSIS — Z888 Allergy status to other drugs, medicaments and biological substances status: Secondary | ICD-10-CM | POA: Insufficient documentation

## 2019-08-26 DIAGNOSIS — Z87891 Personal history of nicotine dependence: Secondary | ICD-10-CM | POA: Insufficient documentation

## 2019-08-26 DIAGNOSIS — L239 Allergic contact dermatitis, unspecified cause: Secondary | ICD-10-CM

## 2019-08-26 DIAGNOSIS — Z91040 Latex allergy status: Secondary | ICD-10-CM | POA: Diagnosis not present

## 2019-08-26 DIAGNOSIS — E782 Mixed hyperlipidemia: Secondary | ICD-10-CM | POA: Insufficient documentation

## 2019-08-26 DIAGNOSIS — I110 Hypertensive heart disease with heart failure: Secondary | ICD-10-CM | POA: Insufficient documentation

## 2019-08-26 DIAGNOSIS — L302 Cutaneous autosensitization: Secondary | ICD-10-CM | POA: Diagnosis not present

## 2019-08-26 DIAGNOSIS — R21 Rash and other nonspecific skin eruption: Secondary | ICD-10-CM | POA: Diagnosis present

## 2019-08-26 NOTE — Discharge Instructions (Addendum)
START USING CETAPHIL SOAP FOR BATHING; YOU CAN FIND IT OVER THE COUNTER.

## 2019-08-26 NOTE — ED Provider Notes (Signed)
[facility] EMERGENCY DEPARTMENT Provider Note   CSN: [id number] Arrival date & time: 08/26/19  1153     History Chief Complaint  Patient presents with  . Allergic Reaction    Sara Villanueva is a 74 y.o. female.  74 year old female with extensive past medical history below who presents with rash.  Several days ago, the patient had stenting and as part of the procedure, she had EKG leads placed on her skin.  Since then, she has had red areas on her skin where the leads were.  The areas are severely itchy.  She states that she cannot stand having any close touching the areas because of the severity of itchiness.  She notes ongoing problems with her feet for which she has been seen recently by dermatology.  They prescribed her some creams but she states that she reacted to the medications so she discontinued them.  The history is provided by the patient.  Allergic Reaction      Past Medical History:  Diagnosis Date  . Arthritis   . Bladder disorder   . CAD (coronary artery disease)    stents x 3, 2013, 2017  . Carotid arterial disease ([place])   . CHF (congestive heart failure) ([place])   . Colon polyps   . High cholesterol   . Hypertension   . Lower GI bleed   . Rheumatoid arteritis ([place])   . Rheumatoid arthritis [facility] [place])     Patient Active Problem List   Diagnosis Date Noted  . Chronic systolic congestive heart failure ([place]) 05/12/2019  . Memory loss 03/21/2019  . Near syncope 02/25/2019  . UTI due to extended-spectrum beta lactamase (ESBL) producing Escherichia coli 02/25/2019  . Urinary retention with incomplete bladder emptying 02/25/2019  . Voiding dysfunction 02/14/2019  . Venous insufficiency (chronic) (peripheral) 12/08/2018  . 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 Mild cognitive impairment with memory loss 09/21/2018  . Acute cystitis without hematuria 09/09/2018  . Heme positive stool 03/28/2018  . S/P carotid endarterectomy  12/27/2017  . Chronic constipation 10/18/2017  . History of colon polyps 10/18/2017  . 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 Acute recurrent sinusitis 03/02/2017  . Anemia, unspecified 01/31/2016  . Knee pain, left 08/05/2015  . Allergic rhinitis 07/15/2015  . Stenosis of carotid artery 07/15/2015  . PVD (peripheral vascular disease) ([place]) 07/15/2015  . Renal artery stenosis ([place]) 07/15/2015  . Bilateral carotid artery stenosis 07/15/2015  . 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 Essential (primary) hypertension 06/13/2015  . HLD (hyperlipidemia) 06/13/2015  . Urgency of micturation 05/03/2015  . Urination frequency 05/03/2015  . Pedal edema 12/04/2014  . Dysesthesia 12/04/2014  . Nuclear sclerotic cataract 09/20/2014  . Lumbar radiculopathy 08/16/2014  . Spondylolisthesis at L5-S1 level 08/07/2014  . Neck pain 08/07/2014  . Chronic rheumatic arthritis ([place]) 08/07/2014  . Rheumatoid arthritis ([place]) 08/07/2014  . Angina pectoris ([place]) 08/03/2014  . Aseptic bony necrosis ([place]) 08/03/2014  . Aseptic necrosis of bone ([place]) 08/03/2014    Past Surgical History:  Procedure Laterality Date  . CAROTID ENDARTERECTOMY    . CORONARY ANGIOPLASTY WITH STENT PLACEMENT       OB History   No obstetric history on file.     Family History  Problem Relation Age of Onset  . Heart disease Mother   . Heart disease Father     Social History   Tobacco Use  . Smoking status: Former Smoker    Packs/day: 0.50  Years: 25.00    Pack years: 12.50    Types: Cigarettes    Quit date: 2000    Years since quitting: 21.2  . Smokeless tobacco: Never Used  Substance Use Topics  . Alcohol use: No    Alcohol/week: 0.0 standard drinks  . Drug use: No    Home Medications Prior to Admission medications   Medication Sig Start Date End Date Taking? Authorizing Provider  amoxicillin (AMOXIL) 500 MG capsule Take 1 capsule (500 mg total) by mouth 3 (three) times daily. 07/02/19    Gwyneth Sprout, MD  aspirin 81 MG tablet Take 81 mg by mouth at bedtime.     [provider]  busPIRone (BUSPAR) 15 MG tablet Take 15 mg by mouth 3 (three) times daily.  08/01/14   [provider]  cephALEXin (KEFLEX) 500 MG capsule Take 1 capsule (500 mg total) by mouth 4 (four) times daily. 03/27/19   Jacalyn Lefevre, MD  clopidogrel (PLAVIX) 75 MG tablet Take 75 mg by mouth daily.    [provider]  donepezil (ARICEPT) 5 MG tablet Take 1 tablet (5 mg total) by mouth at bedtime. 03/21/19   Sater, Pearletha Furl, MD  gabapentin (NEURONTIN) 800 MG tablet TAKE 1 TABLET BY MOUTH THREE TIMES DAILY 01/30/19   Sater, Pearletha Furl, MD  HYDROcodone-acetaminophen (NORCO/VICODIN) 5-325 MG tablet Take 1 tablet by mouth every 4 (four) hours as needed. 05/19/19   Jacalyn Lefevre, MD  NITROSTAT 0.4 MG SL tablet Place 0.4 mg under the tongue every 5 (five) minutes as needed for chest pain.  07/08/14   [provider]  oxyCODONE-acetaminophen (PERCOCET) 5-325 MG tablet Take 1 tablet by mouth every 6 (six) hours as needed for severe pain. 03/26/19   Arby Barrette, MD  pravastatin (PRAVACHOL) 40 MG tablet Take 40 mg by mouth daily.    [provider]  tamsulosin (FLOMAX) 0.4 MG CAPS capsule Take 1 capsule (0.4 mg total) by mouth daily. 01/19/19   Palumbo, April, MD  trospium (SANCTURA) 20 MG tablet Take 20 mg by mouth at bedtime.    [provider]    Allergies    Other, Lamotrigine, Latex, and Nickel  Review of Systems   Review of Systems All other systems reviewed and are negative except that which was mentioned in HPI  Physical Exam Updated Vital Signs BP (!) 92/51 (BP Location: Right Arm)   Pulse (!) 103   Temp 97.9 F (36.6 C)   Resp 18   Ht 5\' 8"  (1.727 m)   Wt 62.1 kg   SpO2 100%   BMI 20.83 kg/m   Physical Exam Vitals and nursing note reviewed. Exam conducted with a chaperone present.  Constitutional:      General: She is not in acute  distress.    Appearance: She is well-developed.  HENT:     Head: Normocephalic and atraumatic.  Eyes:     Conjunctiva/sclera: Conjunctivae normal.  Musculoskeletal:     Cervical back: Neck supple.  Skin:    General: Skin is warm and dry.     Comments: Well-circumscribed red patches on b/l shoulders, upper chest, and lower chest c/w recent lead placement, no sloughing  Neurological:     Mental Status: She is alert and oriented to person, place, and time.  Psychiatric:        Judgment: Judgment normal.     ED Results / Procedures / Treatments   Labs (all labs ordered are listed, but only abnormal results are displayed) Labs  Reviewed - No data to display  EKG None  Radiology No results found.  Procedures Procedures (including critical care time)  Medications Ordered in ED Medications - No data to display  ED Course  I have reviewed the triage vital signs and the nursing notes.    MDM Rules/Calculators/A&P                      Appearance, itchiness and timeline c/w type IV hypersensitivity reaction. Instructed to use hydrocortisone cream as needed for itching but patient states she can't use any creams because she reacts to all of them. Discussed supportive measures. She requested recommendation for soap to use as she is reacting to [place]; I recommended cetaphil and f/u with her dermatologist. Final Clinical Impression(s) / ED Diagnoses Final diagnoses:  Dermal hypersensitivity reaction    Rx / DC Orders ED Discharge Orders    None       Daviel Allegretto, Ambrose Finland, MD 08/26/19 1338

## 2019-08-26 NOTE — ED Triage Notes (Signed)
Pt c/o allergic reaction to chest after having EKG leads placed this week for a stent placement. Pt also c/o rash to both feet.

## 2019-09-11 ENCOUNTER — Other Ambulatory Visit: Payer: Self-pay

## 2019-09-11 ENCOUNTER — Emergency Department (HOSPITAL_BASED_OUTPATIENT_CLINIC_OR_DEPARTMENT_OTHER)
Admission: EM | Admit: 2019-09-11 | Discharge: 2019-09-11 | Disposition: A | Discharge status: Home / Self Care | Payer: Medicare Other | Attending: Emergency Medicine | Admitting: Emergency Medicine

## 2019-09-11 ENCOUNTER — Encounter (HOSPITAL_BASED_OUTPATIENT_CLINIC_OR_DEPARTMENT_OTHER): Payer: Self-pay

## 2019-09-11 DIAGNOSIS — I2510 Atherosclerotic heart disease of native coronary artery without angina pectoris: Secondary | ICD-10-CM | POA: Diagnosis not present

## 2019-09-11 DIAGNOSIS — R42 Dizziness and giddiness: Secondary | ICD-10-CM | POA: Diagnosis not present

## 2019-09-11 DIAGNOSIS — I110 Hypertensive heart disease with heart failure: Secondary | ICD-10-CM | POA: Diagnosis not present

## 2019-09-11 DIAGNOSIS — Z79899 Other long term (current) drug therapy: Secondary | ICD-10-CM | POA: Diagnosis not present

## 2019-09-11 DIAGNOSIS — Z7982 Long term (current) use of aspirin: Secondary | ICD-10-CM | POA: Diagnosis not present

## 2019-09-11 DIAGNOSIS — Z87891 Personal history of nicotine dependence: Secondary | ICD-10-CM | POA: Diagnosis not present

## 2019-09-11 DIAGNOSIS — Z91040 Latex allergy status: Secondary | ICD-10-CM | POA: Diagnosis not present

## 2019-09-11 DIAGNOSIS — I5022 Chronic systolic (congestive) heart failure: Secondary | ICD-10-CM | POA: Insufficient documentation

## 2019-09-11 DIAGNOSIS — I10 Essential (primary) hypertension: Secondary | ICD-10-CM

## 2019-09-11 LAB — BASIC METABOLIC PANEL
Anion gap: 8 (ref 5–15)
BUN: 19 mg/dL (ref 8–23)
CO2: 26 mmol/L (ref 22–32)
Calcium: 9.5 mg/dL (ref 8.9–10.3)
Chloride: 107 mmol/L (ref 98–111)
Creatinine, Ser: 0.89 mg/dL (ref 0.44–1.00)
GFR calc Af Amer: >60 mL/min (ref >60)
GFR calc non Af Amer: >60 mL/min (ref >60)
Glucose, Bld: 89 mg/dL (ref 70–99)
Potassium: 3.8 mmol/L (ref 3.5–5.1)
Sodium: 141 mmol/L (ref 135–145)

## 2019-09-11 LAB — CBC
HCT: 43.9 % (ref 36.0–46.0)
Hemoglobin: 14 g/dL (ref 12.0–15.0)
MCH: 31.3 pg (ref 26.0–34.0)
MCHC: 31.9 g/dL (ref 30.0–36.0)
MCV: 98.2 fL (ref 80.0–100.0)
Platelets: 157 10*3/uL (ref 150–400)
RBC: 4.47 MIL/uL (ref 3.87–5.11)
RDW: 15.7 % — ABNORMAL HIGH (ref 11.5–15.5)
WBC: 5.8 10*3/uL (ref 4.0–10.5)
nRBC: 0 % (ref 0.0–0.2)

## 2019-09-11 MED ORDER — MECLIZINE HCL 12.5 MG PO TABS: 12.5000 mg | ORAL_TABLET | Freq: Three times a day (TID) | ORAL | 0 refills | Status: DC | PRN

## 2019-09-11 MED ORDER — MECLIZINE HCL 25 MG PO TABS
25.0000 mg | ORAL_TABLET | Freq: Once | ORAL | Status: AC
  Administered 2019-09-11: 11:00:00 25 mg via ORAL
  Filled 2019-09-11: qty 1

## 2019-09-11 MED ORDER — AMLODIPINE BESYLATE 5 MG PO TABS
5.0000 mg | ORAL_TABLET | Freq: Once | ORAL | Status: AC
  Administered 2019-09-11: 11:00:00 2.5 mg via ORAL
  Filled 2019-09-11: qty 1

## 2019-09-11 NOTE — ED Provider Notes (Signed)
[facility]GENC[facility] DEPARTMENT Provider Note   CSN: [id number] Arrival date & time: 09/11/19  0944     History Chief Complaint  Patient presents with  . Dizziness    Sara Villanueva is a 74 y.o. female.  HPI   Patient presents the ED for evaluation of dizziness.  Patient states she started noticing the symptoms yesterday morning.  She has a sensation of the room spinning.  Generally is better when she is lying still.  It gets worse when she moves around.  Patient is feeling fine now but she had another episode this morning so she came to the ED.  Patient has been diagnosed with vertigo in the past.  She said she came to the ED and was given a prescription that helped her out.  That prescription has expired.  She has not had any issue with her speech.  No trouble with her vision.  No focal numbness or weakness.  No trouble with her balance  Patient also has history of hypertension.  However when she took her blood pressure in the morning it was normal so she did not take her medications.  Patient states she has a history of having her blood pressure running high and that most of her doctors have told her to check her blood pressure in the morning, and if it is elevated then to start her blood pressure medication  She denies any chest pain or shortness of breath.  No fevers or chills.  Past Medical History:  Diagnosis Date  . Arthritis   . Bladder disorder   . CAD (coronary artery disease)    stents x 3, 2013, 2017  . Carotid arterial disease ([place])   . CHF (congestive heart failure) ([place])   . Colon polyps   . High cholesterol   . Hypertension   . Lower GI bleed   . Rheumatoid arteritis ([place])   . Rheumatoid arthritis [facility])     Patient Active Problem List   Diagnosis Date Noted  . Chronic systolic congestive heart failure ([place]) 05/12/2019  . Memory loss 03/21/2019  . Near syncope 02/25/2019  . UTI due to extended-spectrum beta lactamase (ESBL) producing Escherichia  coli 02/25/2019  . Urinary retention with incomplete bladder emptying 02/25/2019  . Voiding dysfunction 02/14/2019  . Venous insufficiency (chronic) (peripheral) 12/08/2018  . 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 Mild cognitive impairment with memory loss 09/21/2018  . Acute cystitis without hematuria 09/09/2018  . Heme positive stool 03/28/2018  . S/P carotid endarterectomy 12/27/2017  . Chronic constipation 10/18/2017  . History of colon polyps 10/18/2017  . 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 Acute recurrent sinusitis 03/02/2017  . Anemia, unspecified 01/31/2016  . Knee pain, left 08/05/2015  . Allergic rhinitis 07/15/2015  . Stenosis of carotid artery 07/15/2015  . PVD (peripheral vascular disease) ([place]) 07/15/2015  . Renal artery stenosis ([place]) 07/15/2015  . Bilateral carotid artery stenosis 07/15/2015  . 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 Essential (primary) hypertension 06/13/2015  . HLD (hyperlipidemia) 06/13/2015  . Urgency of micturation 05/03/2015  . Urination frequency 05/03/2015  . Pedal edema 12/04/2014  . Dysesthesia 12/04/2014  . Nuclear sclerotic cataract 09/20/2014  . Lumbar radiculopathy 08/16/2014  . Spondylolisthesis at L5-S1 level 08/07/2014  . Neck pain 08/07/2014  . Chronic rheumatic arthritis ([place]) 08/07/2014  . Rheumatoid arthritis ([place]) 08/07/2014  . Angina pectoris ([place]) 08/03/2014  . Aseptic bony necrosis ([place]) 08/03/2014  . Aseptic necrosis  of bone ([facility]) 08/03/2014    Past Surgical History:  Procedure Laterality Date  . CAROTID ENDARTERECTOMY    . CORONARY ANGIOPLASTY WITH STENT PLACEMENT       OB History   No obstetric history on file.     Family History  Problem Relation Age of Onset  . Heart disease Mother   . Heart disease Father     Social History   Tobacco Use  . Smoking status: Former Smoker    Packs/day: 0.50    Years: 25.00    Pack years: 12.50    Types: Cigarettes     Quit date: 2000    Years since quitting: 21.2  . Smokeless tobacco: Never Used  Substance Use Topics  . Alcohol use: No    Alcohol/week: 0.0 standard drinks  . Drug use: No    Home Medications Prior to Admission medications   Medication Sig Start Date End Date Taking? Authorizing Provider  amoxicillin (AMOXIL) 500 MG capsule Take 1 capsule (500 mg total) by mouth 3 (three) times daily. 07/02/19   Gwyneth Sprout, MD  aspirin 81 MG tablet Take 81 mg by mouth at bedtime.     [provider]  busPIRone (BUSPAR) 15 MG tablet Take 15 mg by mouth 3 (three) times daily.  08/01/14   [provider]  cephALEXin (KEFLEX) 500 MG capsule Take 1 capsule (500 mg total) by mouth 4 (four) times daily. 03/27/19   Jacalyn Lefevre, MD  clopidogrel (PLAVIX) 75 MG tablet Take 75 mg by mouth daily.    [provider]  donepezil (ARICEPT) 5 MG tablet Take 1 tablet (5 mg total) by mouth at bedtime. 03/21/19   Sater, Pearletha Furl, MD  gabapentin (NEURONTIN) 800 MG tablet TAKE 1 TABLET BY MOUTH THREE TIMES DAILY 01/30/19   Sater, Pearletha Furl, MD  HYDROcodone-acetaminophen (NORCO/VICODIN) 5-325 MG tablet Take 1 tablet by mouth every 4 (four) hours as needed. 05/19/19   Jacalyn Lefevre, MD  meclizine (ANTIVERT) 12.5 MG tablet Take 1 tablet (12.5 mg total) by mouth 3 (three) times daily as needed for dizziness. 09/11/19   Linwood Dibbles, MD  NITROSTAT 0.4 MG SL tablet Place 0.4 mg under the tongue every 5 (five) minutes as needed for chest pain.  07/08/14   [provider]  oxyCODONE-acetaminophen (PERCOCET) 5-325 MG tablet Take 1 tablet by mouth every 6 (six) hours as needed for severe pain. 03/26/19   Arby Barrette, MD  pravastatin (PRAVACHOL) 40 MG tablet Take 40 mg by mouth daily.    [provider]  tamsulosin (FLOMAX) 0.4 MG CAPS capsule Take 1 capsule (0.4 mg total) by mouth daily. 01/19/19   Palumbo, April, MD  trospium (SANCTURA) 20 MG tablet Take 20 mg by mouth at bedtime.     [provider]    Allergies    Other, Lamotrigine, Latex, and Nickel  Review of Systems   Review of Systems  All other systems reviewed and are negative.   Physical Exam Updated Vital Signs BP (!) 155/86   Pulse 93   Temp 97.7 F (36.5 C) (Oral)   Resp 13   Ht 1.727 m (5\' 8" )   Wt 62.1 kg   SpO2 100%   BMI 20.83 kg/m   Physical Exam Vitals and nursing note reviewed.  Constitutional:      General: She is not in acute distress.    Appearance: She is well-developed.  HENT:     Head: Normocephalic and atraumatic.     Right  Ear: External ear normal.     Left Ear: External ear normal.  Eyes:     General: No scleral icterus.       Right eye: No discharge.        Left eye: No discharge.     Conjunctiva/sclera: Conjunctivae normal.  Neck:     Trachea: No tracheal deviation.  Cardiovascular:     Rate and Rhythm: Normal rate and regular rhythm.  Pulmonary:     Effort: Pulmonary effort is normal. No respiratory distress.     Breath sounds: Normal breath sounds. No stridor. No wheezing or rales.  Abdominal:     General: Bowel sounds are normal. There is no distension.     Palpations: Abdomen is soft.     Tenderness: There is no abdominal tenderness. There is no guarding or rebound.  Musculoskeletal:        General: No tenderness.     Cervical back: Neck supple.  Skin:    General: Skin is warm and dry.     Findings: No rash.  Neurological:     Mental Status: She is alert and oriented to person, place, and time.     Cranial Nerves: No cranial nerve deficit (No facial droop, extraocular movements intact, tongue midline ).     Sensory: No sensory deficit.     Motor: No abnormal muscle tone or seizure activity.     Coordination: Coordination normal.     Comments: No pronator drift bilateral upper extrem, able to hold both legs off bed for 5 seconds, sensation intact in all extremities, no visual field cuts, no left or right sided neglect, normal finger-nose exam  bilaterally, no nystagmus noted      ED Results / Procedures / Treatments   Labs (all labs ordered are listed, but only abnormal results are displayed) Labs Reviewed  CBC - Abnormal; Notable for the following components:      Result Value   RDW 15.7 (*)    All other components within normal limits  BASIC METABOLIC PANEL    EKG EKG Interpretation  Date/Time:  Monday September 11 2019 10:50:56 EDT Ventricular Rate:  91 PR Interval:    QRS Duration: 96 QT Interval:  420 QTC Calculation: 517 R Axis:   85 Text Interpretation: Sinus rhythm RAE, consider biatrial enlargement Borderline right axis deviation LVH with secondary repolarization abnormality Prolonged QT interval Baseline wander in lead(s) V4 No significant change since last tracing Confirmed by Linwood Dibbles [phone]) on 09/11/2019 10:53:28 AM   Radiology No results found.  Procedures Procedures (including critical care time)  Medications Ordered in ED Medications  amLODipine (NORVASC) tablet 5 mg (2.5 mg Oral Given 09/11/19 1106)  meclizine (ANTIVERT) tablet 25 mg (25 mg Oral Given 09/11/19 1106)    ED Course  I have reviewed the triage vital signs and the nursing notes.  Pertinent labs & imaging results that were available during my care of the patient were reviewed by me and considered in my medical decision making (see chart for details).  Clinical Course as of Sep 10 1209  Mon Sep 11, 2019  1053 EKG 12-Lead [JK]  1150 Labs reviewed.  No significant abnormalities.   [JK]    Clinical Course User Index [JK] Linwood Dibbles, MD   MDM Rules/Calculators/A&P                      Patient presented to the ED for evaluation of vertigo symptoms.  Patient symptoms are positional and  resolved when she is at rest.  She does not have any focal neurologic deficits to suggest TIA or stroke.  Patient is able to ambulate without difficulty.  She was given a dose of meclizine and is feeling better after treatment.  I suspect her  symptoms are related to peripheral vertigo.  Hypertension noted in the ED.  Patient was given a dose of her Norvasc and blood pressure is 155/86.  Laboratory tests are reassuring and no signs of any acute endorgan ischemia associated with hypertension.  StAble for discharge.  Warning signs precautions discussed. Final Clinical Impression(s) / ED Diagnoses Final diagnoses:  Vertigo  Hypertension, unspecified type    Rx / DC Orders ED Discharge Orders         Ordered    meclizine (ANTIVERT) 12.5 MG tablet  3 times daily PRN     09/11/19 1211           Linwood Dibbles, MD 09/11/19 1212

## 2019-09-11 NOTE — ED Triage Notes (Addendum)
Pt arrives ambulatory to ED with reports of vertigo yesterday and this morning. Pt states she has also been having trouble with her allergies. Pt reports she felt that the room was spinning yesterday and today.

## 2019-09-11 NOTE — Discharge Instructions (Signed)
Take the medications as needed for dizziness.  Follow-up with your primary care doctor to recheck on her blood pressure.  Return to the ED if you start having trouble with your vision, balance, coordination speech or other concerning symptoms

## 2019-09-20 ENCOUNTER — Encounter: Payer: Self-pay | Admitting: Neurology

## 2019-09-20 ENCOUNTER — Ambulatory Visit (INDEPENDENT_AMBULATORY_CARE_PROVIDER_SITE_OTHER): Payer: Medicare Other | Admitting: Neurology

## 2019-09-20 VITALS — BP 125/78 | HR 108 | Temp 97.9°F | Ht 68.0 in | Wt 133.5 lb

## 2019-09-20 DIAGNOSIS — M069 Rheumatoid arthritis, unspecified: Secondary | ICD-10-CM | POA: Diagnosis not present

## 2019-09-20 DIAGNOSIS — R413 Other amnesia: Secondary | ICD-10-CM | POA: Diagnosis not present

## 2019-09-20 DIAGNOSIS — I6529 Occlusion and stenosis of unspecified carotid artery: Secondary | ICD-10-CM

## 2019-09-20 DIAGNOSIS — M4317 Spondylolisthesis, lumbosacral region: Secondary | ICD-10-CM | POA: Diagnosis not present

## 2019-09-20 DIAGNOSIS — R55 Syncope and collapse: Secondary | ICD-10-CM

## 2019-09-20 MED ORDER — DONEPEZIL HCL 10 MG PO TABS: 10.0000 mg | ORAL_TABLET | Freq: Every day | ORAL | 12 refills | Status: DC

## 2019-09-20 NOTE — Progress Notes (Signed)
[facility]  PATIENT: Sara Villanueva DOB: 1945-11-05  REFERRING DOCTOR OR PCP:  Olivia Mackie SOURCE: patient  _________________________________   HISTORICAL  CHIEF COMPLAINT:  Chief Complaint  Patient presents with  . Follow-up    RM 12 with daughter. Last seen 03/21/19. Here to f/u on RA/Memory/back pain.No falls since last visit. She took nitroglycerin this morning.   . Memory Loss    Did MOCA: 11/30. Last MOCA 18/30.     HISTORY OF PRESENT ILLNESS:  She is a 74 yo woman with chronic lower back pain and memory loss  Update 09/20/2019: She feels she is doing better than she did the last visit but she scored worse today on the MoCA test (11/30 today vs. 19/30).   She has had difficulty with memory that has progressed since 2019.  At the last visit, she scored 19/30 on the [facility] cognitive assessment.  Imaging studies did not show definite hippocampal atrophy so I was reluctant to officially diagnose her with Alzheimer's disease.  We did start donepezil 5 mg.   She has tolerated it well.   She has misplaced some items.    She is still driving and has not been lost but has needed to ask directions.     She has carotid arterial disease and underwent right carotid endarterectomy July 2019.  She follows up with vascular surgery.  She also has had bilateral renal artery stents and has coronary artery disease.  Vascular risk factors include hyperlipidemia and HTN.   She is scheduled to have carotid U/S again next week.   She has not had any more episodes of severe lightheadedness.  She is sleeping well most nights.  She feels mood is doing better with 8 hours of sleep.    She has not needed any medication for anxiety recently.    She no longer takes trospium for her bladder or dicyclomine for GI issues.    She had two stents placed in arteries for the GI system.  She is on Plavix (was on DAPT but aspirin was d/c).  She exercises with a treadmill.    She had her second  vaccination a couple weeks ago.     Montreal Cognitive Assessment  03/21/2019  Visuospatial/ Executive (0/5) 1  Naming (0/3) 3  Attention: Read list of digits (0/2) 1  Attention: Read list of letters (0/1) 1  Attention: Serial 7 subtraction starting at 100 (0/3) 1  Language: Repeat phrase (0/2) 2  Language : Fluency (0/1) 0  Abstraction (0/2) 1  Delayed Recall (0/5) 2  Orientation (0/6) 6  Total 18  Adjusted Score (based on education) 19     Update 03/21/2019: She has noted more difficulties with her memory.   She first noted a problem last year when she was misplacing items.   She is not noting issues with other cognitive tasks such as math or language.    STM was 2/5 at 3 minutes and 1/5 at 20 minutes 3/5 with prompts.     She is sleeping poorly due to her overactive sleep and feels she is sleeping only 3-4 hours.   She does not feel refreshed when she wakes up.  She had an episode of syncope and presented to the emergency room.  She had a CT scan which showed no acute findings..  I personally reviewed the CT scan of the head performed 02/24/2019.   She has mild generalized atrophy but no acute findings.  Echocardiogram showed severe left atrial enlargement  mild mitral valve regurg and a reduced ejection fraction of 45 to 50%.  There was diastolic dysfunction.  She has carotid arterial disease and underwent right carotid endarterectomy July 2019.  She follows up with vascular surgery.  She also has had bilateral renal artery stents and has coronary artery disease.  Vascular risk factors include hyperlipidemia and HTN.   She is scheduled to have carotid U/S again next week.     She has had urinary retention and will be having cystoscopy soon.  She is currently doing self-catheterization.     TSH, B12, Vit D were all normal over th past year.  She has RA and chronic lumbar pain.        Update 12/14/2017: She feels her back is much better since she was diagnosed with RA and was  started on Remicade.    She sees Dr. Dierdre Forth of Rheumatology.   It has helped her more than the ESI did.      She has had a carotid doppler recently and has 80% right ICA and has 60% left ICA stenosis.   She is scheduled to have a right CEA.     She is noting more swelling in her left ankle.    She notes some pain there in the afternoons.     She is on meloxicam and gabapentin for her pain.       Update 06/15/2017:  She has pain that is mostly in the back but also near the inside left knee and posterior ankle.    She feels gabapentin used to help her but not much now.   Last year, she had an epidural steroid injection for probable left L5 radiculopathy but did not receive any benefit. She sore neurosurgery (Dr. Petra Kuba), but surgery was not felt to be a good option for her. Also of note, she has rheumatoid arthritis.  I personally reviewed an MRI from [facility] performed last month. It shows spondylolisthesis of L5 upon S1 associated with facet hypertrophy and disc bulging of the uncovered disc. There is moderately severe left foraminal narrowing with possible left L5 nerve root compression. Additionally there is moderate bilateral lateral recess stenosis. No nerve root impingement at other levels.  She also reports some neck pain but that has been pretty stable and does not bother her much at this time.  From 01/25/2017:  Low back pain: She is reporting more pain going down the left leg. Previously her pain was predominantly in the lower back but now it is worse in the leg than the back. She denies any weakness but does note some sensory changes in the foot. There is no problems with bowel or bladder function.   She sees Dr. Dierdre Forth of Rheum (has RA) and was referred to Dr. Shon Baton.   She had one ESI with him but she feels she did not get any benefit.   She has significant spondylolisthesis at L5-S1 and by history and exam appears to have an L5 radiculopathy on the left. I had done  several epidural steroids on her years ago but we do not have the equipment at the current office.   Trigger point injections have helped her some in the past but we are trying to limit these as she has avascular necrosis. Current low back pain is milder but the left leg pain is more severe. It is intermittent and will increase with certain positions. Gabapentin initially seemed to help but not as much recently.  Dysesthesia:   She  has a dysesthetic sensation in the left foot and in her fingers.   REVIEW OF SYSTEMS: Constitutional: No fevers, chills, sweats, or change in appetite Eyes: No visual changes, double vision, eye pain Ear, nose and throat: No hearing loss, ear pain, nasal congestion, sore throat Cardiovascular: No chest pain, palpitations Respiratory: No shortness of breath at rest or with exertion.   No wheezes GastrointestinaI: No nausea, vomiting, diarrhea, abdominal pain, fecal incontinence Genitourinary: No dysuria, urinary retention or frequency.  No nocturia. Musculoskeletal: Mild neck pain; moderate back pain and left hip pain Integumentary: No rash, pruritus, skin lesions Neurological: as above Psychiatric: No depression at this time.  No anxiety Endocrine: No palpitations, diaphoresis, change in appetite, change in weigh or increased thirst Hematologic/Lymphatic: No anemia, purpura, petechiae. Allergic/Immunologic: No itchy/runny eyes, nasal congestion, recent allergic reactions, rashes  ALLERGIES: Allergies  Allergen Reactions  . Other Rash    Prefers paper tape  . Lamotrigine Rash  . Latex Other (See Comments) and Rash    unknown   . Nickel Rash    HOME MEDICATIONS:  Current Outpatient Medications:  .  busPIRone (BUSPAR) 15 MG tablet, Take 15 mg by mouth 3 (three) times daily. , Disp: , Rfl:  .  clopidogrel (PLAVIX) 75 MG tablet, Take 75 mg by mouth daily., Disp: , Rfl:  .  donepezil (ARICEPT) 10 MG tablet, Take 1 tablet (10 mg total) by mouth at  bedtime., Disp: 30 tablet, Rfl: 12 .  gabapentin (NEURONTIN) 800 MG tablet, TAKE 1 TABLET BY MOUTH THREE TIMES DAILY, Disp: 90 tablet, Rfl: 1 .  meclizine (ANTIVERT) 12.5 MG tablet, Take 1 tablet (12.5 mg total) by mouth 3 (three) times daily as needed for dizziness., Disp: 21 tablet, Rfl: 0 .  NITROSTAT 0.4 MG SL tablet, Place 0.4 mg under the tongue every 5 (five) minutes as needed for chest pain. , Disp: , Rfl:  .  pravastatin (PRAVACHOL) 40 MG tablet, Take 40 mg by mouth daily., Disp: , Rfl:  .  tamsulosin (FLOMAX) 0.4 MG CAPS capsule, Take 1 capsule (0.4 mg total) by mouth daily., Disp: 30 capsule, Rfl: 0  PAST MEDICAL HISTORY: Past Medical History:  Diagnosis Date  . Arthritis   . Bladder disorder   . CAD (coronary artery disease)    stents x 3, 2013, 2017  . Carotid arterial disease (HCC)   . CHF (congestive heart failure) (HCC)   . Colon polyps   . High cholesterol   . Hypertension   . Lower GI bleed   . Rheumatoid arteritis (HCC)   . Rheumatoid arthritis (HCC)     PAST SURGICAL HISTORY: Past Surgical History:  Procedure Laterality Date  . CAROTID ENDARTERECTOMY    . CORONARY ANGIOPLASTY WITH STENT PLACEMENT      FAMILY HISTORY: Family History  Problem Relation Age of Onset  . Heart disease Mother   . Heart disease Father     SOCIAL HISTORY:  Social History   Socioeconomic History  . Marital status: Married    Spouse name: Not on file  . Number of children: Not on file  . Years of education: Not on file  . Highest education level: Not on file  Occupational History  . Not on file  Tobacco Use  . Smoking status: Former Smoker    Packs/day: 0.50    Years: 25.00    Pack years: 12.50    Types: Cigarettes    Quit date: 2000    Years since quitting: 21.3  . Smokeless  tobacco: Never Used  Substance and Sexual Activity  . Alcohol use: No    Alcohol/week: 0.0 standard drinks  . Drug use: No  . Sexual activity: Not on file  Other Topics Concern  . Not on  file  Social History Narrative  . Not on file   Social Determinants of Health   Financial Resource Strain:   . Difficulty of Paying Living Expenses:   Food Insecurity:   . Worried About Charity fundraiser in the Last Year:   . Arboriculturist in the Last Year:   Transportation Needs:   . Film/video editor (Medical):   Marland Kitchen Lack of Transportation (Non-Medical):   Physical Activity:   . Days of Exercise per Week:   . Minutes of Exercise per Session:   Stress:   . Feeling of Stress :   Social Connections:   . Frequency of Communication with Friends and Family:   . Frequency of Social Gatherings with Friends and Family:   . Attends Religious Services:   . Active Member of Clubs or Organizations:   . Attends Archivist Meetings:   Marland Kitchen Marital Status:   Intimate Partner Violence:   . Fear of Current or Ex-Partner:   . Emotionally Abused:   Marland Kitchen Physically Abused:   . Sexually Abused:      PHYSICAL EXAM  Vitals:   09/20/19 1437  BP: 125/78  Pulse: (!) 108  Temp: 97.9 F (36.6 C)  SpO2: 97%  Weight: 133 lb 8 oz (60.6 kg)  Height: 5\' 8"  (1.727 m)    Body mass index is 20.3 kg/m.   General: The patient is well-developed and well-nourished and in no acute distress.    Skin/Musculoskeletal:    There is mild pedal edema.  No rashes.  She does not have any significant tenderness over the lower lumbar region or the piriformis muscles.    Neurologic Exam  Mental status: The patient is alert and oriented x 3 at the time of the examination.    Cranial nerves: Extraocular movements are full.    Facial strength and sensation is normal.  Hearing is normal and appears to be symmetric  Motor:  Muscle bulk is normal.   Tone is normal. Strength is 5/5 in the arms and legs except for 4+/5 EHL strength on the left..   Sensory: She has intact sensation to touch and vibration in the hands. In the legs, she has symmetric vibration sensation.  There is reduced sensation in the  L5 distribution on the left.  Gait and station: Station is normal.   The gait is arthritic. The tandem gait is wide. There is no Romberg sign.   Reflexes: Deep tendon reflexes are symmetric and normal bilaterally.       DIAGNOSTIC DATA (LABS, IMAGING, TESTING) - I reviewed patient records, labs, notes, testing and imaging myself where available.  Lab Results  Component Value Date   WBC 5.8 09/11/2019   HGB 14.0 09/11/2019   HCT 43.9 09/11/2019   MCV 98.2 09/11/2019   PLT 157 09/11/2019      Component Value Date/Time   NA 141 09/11/2019 1113   K 3.8 09/11/2019 1113   CL 107 09/11/2019 1113   CO2 26 09/11/2019 1113   GLUCOSE 89 09/11/2019 1113   BUN 19 09/11/2019 1113   CREATININE 0.89 09/11/2019 1113   CALCIUM 9.5 09/11/2019 1113   PROT 8.1 05/19/2019 1230   ALBUMIN 3.5 05/19/2019 1230   AST 32 05/19/2019  1230   ALT 37 05/19/2019 1230   ALKPHOS 97 05/19/2019 1230   BILITOT 0.5 05/19/2019 1230   GFRNONAA >60 09/11/2019 1113   GFRAA >60 09/11/2019 1113       ASSESSMENT AND PLAN  Memory loss  Stenosis of carotid artery, unspecified laterality  Spondylolisthesis at L5-S1 level  Rheumatoid arthritis, involving unspecified site, unspecified whether rheumatoid factor present (HCC)  Near syncope   1.   Increase the donepezil to 10 mg for probable AD.  On interesting with me, she seems better off than a MoCa score of 11/30.   Consider adding Namenda. 2.   Low back pain has been less troublesome recently.   3.   return to clinic in 12 months or sooner if there are new or worsening neurologic symptoms.  40-minute office visit with the majority of the time spent face-to-face for history and physical, discussion/counseling and decision-making.  Additional time with record review and documentation.  Xzaiver Vayda A. Epimenio Foot, MD, PhD 09/20/2019, 4:58 PM Certified in Neurology, Clinical Neurophysiology, Sleep Medicine, Pain Medicine and Neuroimaging  [facility] [address] [place], [place] [address] [phone]

## 2019-09-28 ENCOUNTER — Emergency Department (HOSPITAL_COMMUNITY)
Admission: EM | Admit: 2019-09-28 | Discharge: 2019-09-28 | Disposition: A | Discharge status: Home / Self Care | Payer: Medicare Other | Attending: Emergency Medicine | Admitting: Emergency Medicine

## 2019-09-28 ENCOUNTER — Emergency Department (HOSPITAL_COMMUNITY): Payer: Medicare Other

## 2019-09-28 ENCOUNTER — Other Ambulatory Visit: Payer: Self-pay | Admitting: *Deleted

## 2019-09-28 ENCOUNTER — Other Ambulatory Visit: Payer: Self-pay

## 2019-09-28 ENCOUNTER — Encounter (HOSPITAL_COMMUNITY): Payer: Self-pay | Admitting: Emergency Medicine

## 2019-09-28 ENCOUNTER — Telehealth: Payer: Self-pay | Admitting: Neurology

## 2019-09-28 DIAGNOSIS — M069 Rheumatoid arthritis, unspecified: Secondary | ICD-10-CM | POA: Diagnosis not present

## 2019-09-28 DIAGNOSIS — R0789 Other chest pain: Secondary | ICD-10-CM | POA: Diagnosis not present

## 2019-09-28 DIAGNOSIS — R002 Palpitations: Secondary | ICD-10-CM | POA: Diagnosis not present

## 2019-09-28 DIAGNOSIS — Z79899 Other long term (current) drug therapy: Secondary | ICD-10-CM | POA: Insufficient documentation

## 2019-09-28 DIAGNOSIS — I110 Hypertensive heart disease with heart failure: Secondary | ICD-10-CM | POA: Diagnosis not present

## 2019-09-28 DIAGNOSIS — I5022 Chronic systolic (congestive) heart failure: Secondary | ICD-10-CM | POA: Insufficient documentation

## 2019-09-28 DIAGNOSIS — Z7901 Long term (current) use of anticoagulants: Secondary | ICD-10-CM | POA: Insufficient documentation

## 2019-09-28 DIAGNOSIS — Z87891 Personal history of nicotine dependence: Secondary | ICD-10-CM | POA: Diagnosis not present

## 2019-09-28 DIAGNOSIS — Z91040 Latex allergy status: Secondary | ICD-10-CM | POA: Diagnosis not present

## 2019-09-28 DIAGNOSIS — I2510 Atherosclerotic heart disease of native coronary artery without angina pectoris: Secondary | ICD-10-CM | POA: Insufficient documentation

## 2019-09-28 DIAGNOSIS — N390 Urinary tract infection, site not specified: Secondary | ICD-10-CM

## 2019-09-28 LAB — BASIC METABOLIC PANEL
Anion gap: 10 (ref 5–15)
BUN: 22 mg/dL (ref 8–23)
CO2: 22 mmol/L (ref 22–32)
Calcium: 9.7 mg/dL (ref 8.9–10.3)
Chloride: 108 mmol/L (ref 98–111)
Creatinine, Ser: 0.76 mg/dL (ref 0.44–1.00)
GFR calc Af Amer: >60 mL/min (ref >60)
GFR calc non Af Amer: >60 mL/min (ref >60)
Glucose, Bld: 126 mg/dL — ABNORMAL HIGH (ref 70–99)
Potassium: 3.7 mmol/L (ref 3.5–5.1)
Sodium: 140 mmol/L (ref 135–145)

## 2019-09-28 LAB — CBC
HCT: 43.3 % (ref 36.0–46.0)
Hemoglobin: 14.3 g/dL (ref 12.0–15.0)
MCH: 32.1 pg (ref 26.0–34.0)
MCHC: 33 g/dL (ref 30.0–36.0)
MCV: 97.1 fL (ref 80.0–100.0)
Platelets: 222 10*3/uL (ref 150–400)
RBC: 4.46 MIL/uL (ref 3.87–5.11)
RDW: 13.9 % (ref 11.5–15.5)
WBC: 6 10*3/uL (ref 4.0–10.5)
nRBC: 0 % (ref 0.0–0.2)

## 2019-09-28 LAB — TROPONIN I (HIGH SENSITIVITY)
Troponin I (High Sensitivity): 44 ng/L — ABNORMAL HIGH (ref <18)
Troponin I (High Sensitivity): 43 ng/L — ABNORMAL HIGH (ref <18)

## 2019-09-28 MED ORDER — SODIUM CHLORIDE 0.9% FLUSH: 3.0000 mL | Freq: Once | INTRAVENOUS | Status: DC

## 2019-09-28 NOTE — Progress Notes (Deleted)
New Patient Note  RE: Sara Villanueva MRN: [id number] DOB: Oct 07, 1945 Date of Office Visit: 09/29/2019  Referring provider: Truett Perna, MD Primary care provider: Truett Perna, MD  Chief Complaint: No chief complaint on file.  History of Present Illness: I had the pleasure of seeing Sara Villanueva for initial evaluation at the [facility] on 09/28/2019. She is a 74 y.o. female, who is referred here by Truett Perna, MD for the evaluation of ***.  ***  Assessment and Plan: [name] is a 74 y.o. female with: No problem-specific Assessment & Plan notes found for this encounter.  No follow-ups on file.  No orders of the defined types were placed in this encounter.  Lab Orders  No laboratory test(s) ordered today    Other allergy screening: Asthma: {Blank single:19197::"yes","no"} Rhino conjunctivitis: {Blank single:19197::"yes","no"} Food allergy: {Blank single:19197::"yes","no"} Medication allergy: {Blank single:19197::"yes","no"} Hymenoptera allergy: {Blank single:19197::"yes","no"} Urticaria: {Blank single:19197::"yes","no"} Eczema:{Blank single:19197::"yes","no"} History of recurrent infections suggestive of immunodeficency: {Blank single:19197::"yes","no"}  Diagnostics: Spirometry:  Tracings reviewed. Her effort: {Blank single:19197::"Good reproducible efforts.","It was hard to get consistent efforts and there is a question as to whether this reflects a maximal maneuver.","Poor effort, data can not be interpreted."} FVC: ***L FEV1: ***L, ***% predicted FEV1/FVC ratio: ***% Interpretation: {Blank single:19197::"Spirometry consistent with mild obstructive disease","Spirometry consistent with moderate obstructive disease","Spirometry consistent with severe obstructive disease","Spirometry consistent with possible restrictive disease","Spirometry consistent with mixed obstructive and restrictive disease","Spirometry uninterpretable due to technique","Spirometry  consistent with normal pattern","No overt abnormalities noted given today's efforts"}.  Please see scanned spirometry results for details.  Skin Testing: {Blank single:19197::"Select foods","Environmental allergy panel","Environmental allergy panel and select foods","Food allergy panel","None","Deferred due to recent antihistamines use"}. Positive test to: ***. Negative test to: ***.  Results discussed with patient/family.   Past Medical History: Patient Active Problem List   Diagnosis Date Noted  . Chronic systolic congestive heart failure (HCC) 05/12/2019  . Memory loss 03/21/2019  . Near syncope 02/25/2019  . UTI due to extended-spectrum beta lactamase (ESBL) producing Escherichia coli 02/25/2019  . Urinary retention with incomplete bladder emptying 02/25/2019  . Voiding dysfunction 02/14/2019  . Venous insufficiency (chronic) (peripheral) 12/08/2018  . 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 Mild cognitive impairment with memory loss 09/21/2018  . Acute cystitis without hematuria 09/09/2018  . Heme positive stool 03/28/2018  . S/P carotid endarterectomy 12/27/2017  . Chronic constipation 10/18/2017  . History of colon polyps 10/18/2017  . 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 Acute recurrent sinusitis 03/02/2017  . Anemia, unspecified 01/31/2016  . Knee pain, left 08/05/2015  . Allergic rhinitis 07/15/2015  . Stenosis of carotid artery 07/15/2015  . PVD (peripheral vascular disease) (HCC) 07/15/2015  . Renal artery stenosis (HCC) 07/15/2015  . Bilateral carotid artery stenosis 07/15/2015  . 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 Essential (primary) hypertension 06/13/2015  . HLD (hyperlipidemia) 06/13/2015  . Urgency of micturation 05/03/2015  . Urination frequency 05/03/2015  . Pedal edema 12/04/2014  . Dysesthesia 12/04/2014  . Nuclear sclerotic cataract 09/20/2014  . Lumbar radiculopathy 08/16/2014  . Spondylolisthesis at  L5-S1 level 08/07/2014  . Neck pain 08/07/2014  . Chronic rheumatic arthritis (HCC) 08/07/2014  . Rheumatoid arthritis (HCC) 08/07/2014  . Angina pectoris (HCC) 08/03/2014  . Aseptic bony necrosis (HCC) 08/03/2014  . Aseptic necrosis of bone (HCC) 08/03/2014   Past Medical History:  Diagnosis Date  . Arthritis   . Bladder disorder   . CAD (coronary artery disease)  stents x 3, 2013, 2017  . Carotid arterial disease ([place])   . CHF (congestive heart failure) ([place])   . Colon polyps   . High cholesterol   . Hypertension   . Lower GI bleed   . Rheumatoid arteritis ([place])   . Rheumatoid arthritis [facility])    Past Surgical History: Past Surgical History:  Procedure Laterality Date  . CAROTID ENDARTERECTOMY    . CORONARY ANGIOPLASTY WITH STENT PLACEMENT     Medication List:  Current Outpatient Medications  Medication Sig Dispense Refill  . busPIRone (BUSPAR) 15 MG tablet Take 15 mg by mouth 3 (three) times daily.     . clopidogrel (PLAVIX) 75 MG tablet Take 75 mg by mouth daily.    Marland Kitchen donepezil (ARICEPT) 10 MG tablet Take 1 tablet (10 mg total) by mouth at bedtime. 30 tablet 12  . gabapentin (NEURONTIN) 800 MG tablet TAKE 1 TABLET BY MOUTH THREE TIMES DAILY 90 tablet 1  . meclizine (ANTIVERT) 12.5 MG tablet Take 1 tablet (12.5 mg total) by mouth 3 (three) times daily as needed for dizziness. 21 tablet 0  . NITROSTAT 0.4 MG SL tablet Place 0.4 mg under the tongue every 5 (five) minutes as needed for chest pain.     . pravastatin (PRAVACHOL) 40 MG tablet Take 40 mg by mouth daily.    . tamsulosin (FLOMAX) 0.4 MG CAPS capsule Take 1 capsule (0.4 mg total) by mouth daily. 30 capsule 0   No current facility-administered medications for this visit.   Allergies: Allergies  Allergen Reactions  . Other Rash    Prefers paper tape  . Lamotrigine Rash  . Latex Other (See Comments) and Rash    unknown   . Nickel Rash   Social History: Social History   Socioeconomic History  . Marital  status: Married    Spouse name: Not on file  . Number of children: Not on file  . Years of education: Not on file  . Highest education level: Not on file  Occupational History  . Not on file  Tobacco Use  . Smoking status: Former Smoker    Packs/day: 0.50    Years: 25.00    Pack years: 12.50    Types: Cigarettes    Quit date: 2000    Years since quitting: 21.3  . Smokeless tobacco: Never Used  Substance and Sexual Activity  . Alcohol use: No    Alcohol/week: 0.0 standard drinks  . Drug use: No  . Sexual activity: Not on file  Other Topics Concern  . Not on file  Social History Narrative  . Not on file   Social Determinants of Health   Financial Resource Strain:   . Difficulty of Paying Living Expenses:   Food Insecurity:   . Worried About Charity fundraiser in the Last Year:   . Arboriculturist in the Last Year:   Transportation Needs:   . Film/video editor (Medical):   Marland Kitchen Lack of Transportation (Non-Medical):   Physical Activity:   . Days of Exercise per Week:   . Minutes of Exercise per Session:   Stress:   . Feeling of Stress :   Social Connections:   . Frequency of Communication with Friends and Family:   . Frequency of Social Gatherings with Friends and Family:   . Attends Religious Services:   . Active Member of Clubs or Organizations:   . Attends Archivist Meetings:   Marland Kitchen Marital Status:  Lives in a ***. Smoking: *** Occupation: ***  Environmental HistorySurveyor, minerals in the house: Copywriter, advertising in the family room: {Blank single:19197::"yes","no"} Carpet in the bedroom: {Blank single:19197::"yes","no"} Heating: {Blank single:19197::"electric","gas"} Cooling: {Blank single:19197::"central","window"} Pet: {Blank single:19197::"yes ***","no"}  Family History: Family History  Problem Relation Age of Onset  . Heart disease Mother   . Heart disease Father    Problem                                 Relation Asthma                                   *** Eczema                                *** Food allergy                          *** Allergic rhino conjunctivitis     ***  Review of Systems  Constitutional: Negative for appetite change, chills, fever and unexpected weight change.  HENT: Negative for congestion and rhinorrhea.   Eyes: Negative for itching.  Respiratory: Negative for cough, chest tightness, shortness of breath and wheezing.   Cardiovascular: Negative for chest pain.  Gastrointestinal: Negative for abdominal pain.  Genitourinary: Negative for difficulty urinating.  Skin: Negative for rash.  Allergic/Immunologic: Negative for environmental allergies and food allergies.  Neurological: Negative for headaches.   Objective: There were no vitals taken for this visit. There is no height or weight on file to calculate BMI. Physical Exam  Constitutional: She is oriented to person, place, and time. She appears well-developed and well-nourished.  HENT:  Head: Normocephalic and atraumatic.  Right Ear: External ear normal.  Left Ear: External ear normal.  Nose: Nose normal.  Mouth/Throat: Oropharynx is clear and moist.  Eyes: Conjunctivae and EOM are normal.  Cardiovascular: Normal rate, regular rhythm and normal heart sounds. Exam reveals no gallop and no friction rub.  No murmur heard. Pulmonary/Chest: Effort normal and breath sounds normal. She has no wheezes. She has no rales.  Abdominal: Soft.  Musculoskeletal:     Cervical back: Neck supple.  Neurological: She is alert and oriented to person, place, and time.  Skin: Skin is warm. No rash noted.  Psychiatric: She has a normal mood and affect. Her behavior is normal.  Nursing note and vitals reviewed.  The plan was reviewed with the patient/family, and all questions/concerned were addressed.  It was my pleasure to see Guadalupe today and participate in her care. Please feel free to contact me with any questions or  concerns.  Sincerely,  Wyline Mood, DO Allergy & Immunology  [facility] office: [phone] [facility] office: [phone] [place] office: [phone]

## 2019-09-28 NOTE — ED Notes (Signed)
RN getting Troponin when she start IV

## 2019-09-28 NOTE — Telephone Encounter (Signed)
Called and spoke with daughter. She is concerned about her mother. Reports her mother's behavior is very eratic within the last 3-4 days. She is aware Dr. Epimenio Foot Increased her aricept to 10mg  po daily. She constantly talks and wont let her get a word in. She got locked out without her keys. She spoke with her yesterday who states she is taking medication from Dr. for her memory. She was confused by this so she called her pharmacy. Verified Aricept called in by Dr. Lenis Dickinson. Has eczema and prescribed anxiety/memory pill by dermatologist. Epimenio Foot of name. She will check on this.  She went over to see her mother yesterday and asked to see her medications but her mother refused and threatened to call the police. She has appt next week with PCP to go over her meds. She has pill box that she divides pills up in. I asked if she felt she had UTI and she confirmed that she has been complaining that she has been having to go to the bathroom a lot. She is going to see if PCP can bring her in to check urinalysis for UTI. Aware this can cause increase in confusion. Also recommended they keep a closer eye on medications, daughter reported she took off label of two medication bottles and not sure what it is. Also recommended she make sure she is drinking plenty of fluids/not dehydrated. She wants to know if anything was missed on CT scan. Advised I will discuss with Dr. Posey Rea and call back.

## 2019-09-28 NOTE — ED Triage Notes (Signed)
Pt to triage via [facility] from home.  Reports heart racing at home and pain to center of chest.  Denies any symptoms at present.  Pt under a lot of stress with her daughter.

## 2019-09-28 NOTE — ED Notes (Signed)
DC instructions given to the pt, pt verbalized understanding, PT is AO x 4, NAD noticed, pt wheeled out to the lobby.

## 2019-09-28 NOTE — Telephone Encounter (Signed)
Called daughter back. Relayed per Dr. Epimenio Foot that he would like to check UA on her asap. Can complete here or at PCP office. Placed orders. Advised we are open until 5pm today but closed tomorrow. If she cannot come today, she can see if she can go to PCP office tomorrow to have this completed. Also advised per Dr. Epimenio Foot that they should decrease her aricept to 5mg  po qd from the 10mg  to make sure this is not playing a role.   Daughter reports that she found out that her mother saw Dr. last year and was prescribed memantine. Unsure if she is taking this. She was also given dicyclomine by her gastro MD. She was prescribed doxepin by her dematologist but she is not taking this because it made her too sleepy.

## 2019-09-28 NOTE — Telephone Encounter (Signed)
Pt's daughter [place]ero Duck on [place] is needing to speak to the RN about the pt's medications and erratic behavior. Please advise.

## 2019-09-28 NOTE — ED Provider Notes (Signed)
[facility] EMERGENCY DEPARTMENT Provider Note   CSN: [id number] Arrival date & time: 09/28/19  1701     History Chief Complaint  Patient presents with  . Palpitations    Sara Villanueva is a 74 y.o. female.  HPI   74 year old female with a past medical history of CAD status post 3 stents in 2013 in 2017, carotid artery disease, CHF, HTN presenting to the emergency department brought in by EMS from home complaining of palpitations and pain in the center of her chest.  Patient symptoms have since resolved upon arrival to the emergency department.  Patient reports that she has been under a lot of stress currently with her daughter.  Patient noted she had a 10-minute episode of feeling like she had intermittent palpitations earlier this morning around 9:30 AM.  This resolved after she reportedly prayed on the stress that her daughter was causing her.  Patient notes that she intermittently develops palpitations when she becomes increasingly stressed with interactions around her daughter.  Patient denies any chest pain, shortness of breath, abdominal pain, headache, vision changes, lightheadedness, change in bowel or bladder function.  Past Medical History:  Diagnosis Date  . Arthritis   . Bladder disorder   . CAD (coronary artery disease)    stents x 3, 2013, 2017  . Carotid arterial disease (HCC)   . CHF (congestive heart failure) (HCC)   . Colon polyps   . High cholesterol   . Hypertension   . Lower GI bleed   . Rheumatoid arteritis (HCC)   . Rheumatoid arthritis [facility])     Patient Active Problem List   Diagnosis Date Noted  . Chronic systolic congestive heart failure (HCC) 05/12/2019  . Memory loss 03/21/2019  . Near syncope 02/25/2019  . UTI due to extended-spectrum beta lactamase (ESBL) producing Escherichia coli 02/25/2019  . Urinary retention with incomplete bladder emptying 02/25/2019  . Voiding dysfunction 02/14/2019  . Venous insufficiency (chronic)  (peripheral) 12/08/2018  . 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 Mild cognitive impairment with memory loss 09/21/2018  . Acute cystitis without hematuria 09/09/2018  . Heme positive stool 03/28/2018  . S/P carotid endarterectomy 12/27/2017  . Chronic constipation 10/18/2017  . History of colon polyps 10/18/2017  . 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 Acute recurrent sinusitis 03/02/2017  . Anemia, unspecified 01/31/2016  . Knee pain, left 08/05/2015  . Allergic rhinitis 07/15/2015  . Stenosis of carotid artery 07/15/2015  . PVD (peripheral vascular disease) (HCC) 07/15/2015  . Renal artery stenosis (HCC) 07/15/2015  . Bilateral carotid artery stenosis 07/15/2015  . 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 Essential (primary) hypertension 06/13/2015  . HLD (hyperlipidemia) 06/13/2015  . Urgency of micturation 05/03/2015  . Urination frequency 05/03/2015  . Pedal edema 12/04/2014  . Dysesthesia 12/04/2014  . Nuclear sclerotic cataract 09/20/2014  . Lumbar radiculopathy 08/16/2014  . Spondylolisthesis at L5-S1 level 08/07/2014  . Neck pain 08/07/2014  . Chronic rheumatic arthritis (HCC) 08/07/2014  . Rheumatoid arthritis (HCC) 08/07/2014  . Angina pectoris (HCC) 08/03/2014  . Aseptic bony necrosis (HCC) 08/03/2014  . Aseptic necrosis of bone (HCC) 08/03/2014    Past Surgical History:  Procedure Laterality Date  . CAROTID ENDARTERECTOMY    . CORONARY ANGIOPLASTY WITH STENT PLACEMENT       OB History   No obstetric history on file.     Family History  Problem Relation Age of Onset  . Heart disease Mother   .  Heart disease Father     Social History   Tobacco Use  . Smoking status: Former Smoker    Packs/day: 0.50    Years: 25.00    Pack years: 12.50    Types: Cigarettes    Quit date: 2000    Years since quitting: 21.3  . Smokeless tobacco: Never Used  Substance Use Topics  . Alcohol use: No    Alcohol/week: 0.0  standard drinks  . Drug use: No    Home Medications Prior to Admission medications   Medication Sig Start Date End Date Taking? Authorizing Provider  busPIRone (BUSPAR) 15 MG tablet Take 15 mg by mouth 3 (three) times daily.  08/01/14  Yes [provider]  carvedilol (COREG) 3.125 MG tablet Take 3.125 mg by mouth 2 (two) times daily. 06/21/19  Yes [provider]  clobetasol cream (TEMOVATE) 0.05 % APPLY CREAM TOPICALLY TO AFFECTED AREA TWICE DAILY 07/31/19  Yes [provider]  clopidogrel (PLAVIX) 75 MG tablet Take 75 mg by mouth daily.   Yes [provider]  dicyclomine (BENTYL) 10 MG capsule Take 10 mg by mouth 4 (four) times daily as needed. 09/25/19  Yes [provider]  donepezil (ARICEPT) 10 MG tablet Take 1 tablet (10 mg total) by mouth at bedtime. 09/20/19  Yes Sater, Nanine Means, MD  ibuprofen (ADVIL) 800 MG tablet Take by mouth. 07/22/19  Yes [provider]  linaclotide (LINZESS) 145 MCG CAPS capsule Take 145 mcg by mouth once a week.  09/07/19  Yes [provider]  memantine (NAMENDA) 5 MG tablet Take 5 mg by mouth 2 (two) times daily. 09/10/19  Yes [provider]  NITROSTAT 0.4 MG SL tablet Place 0.4 mg under the tongue every 5 (five) minutes as needed for chest pain.  07/08/14  Yes [provider]  pravastatin (PRAVACHOL) 40 MG tablet Take 40 mg by mouth daily.   Yes [provider]  ascorbic acid (VITAMIN C) 100 MG tablet Take by mouth.    [provider]  ferrous sulfate 325 (65 FE) MG tablet Take by mouth.    [provider]  gabapentin (NEURONTIN) 800 MG tablet TAKE 1 TABLET BY MOUTH THREE TIMES DAILY Patient not taking: Reported on 09/28/2019 01/30/19   Sater, Nanine Means, MD  halobetasol (ULTRAVATE) 0.05 % cream APPLY CREAM TOPICALLY TWICE DAILY 09/27/19   [provider]  meclizine (ANTIVERT) 12.5 MG tablet Take 1 tablet (12.5 mg total) by mouth 3 (three) times daily as  needed for dizziness. Patient not taking: Reported on 09/28/2019 09/11/19   Dorie Rank, MD  predniSONE (STERAPRED UNI-PAK 48 TAB) 10 MG (48) TBPK tablet  07/11/19   [provider]  tamsulosin (FLOMAX) 0.4 MG CAPS capsule Take 1 capsule (0.4 mg total) by mouth daily. Patient not taking: Reported on 09/28/2019 01/19/19   Palumbo, April, MD    Allergies    Other, Lamotrigine, Latex, and Nickel  Review of Systems   Review of Systems  Constitutional: Negative for activity change, appetite change, chills, diaphoresis, fatigue and fever.  HENT: Negative for congestion and rhinorrhea.   Respiratory: Negative for cough, shortness of breath and wheezing.   Cardiovascular: Positive for chest pain and palpitations.  Gastrointestinal: Negative for abdominal distention, abdominal pain, diarrhea, nausea and vomiting.  Genitourinary: Negative for decreased urine volume, difficulty urinating, dysuria, frequency and urgency.  Musculoskeletal: Negative for gait problem.  Skin: Negative for color change and wound.  Neurological: Negative for dizziness, syncope, weakness, light-headedness and  headaches.  All other systems reviewed and are negative.   Physical Exam Updated Vital Signs BP 139/84   Pulse 93   Temp 98.2 F (36.8 C) (Oral)   Resp 16   Ht 5\' 8"  (1.727 m)   Wt 60.6 kg   SpO2 99%   BMI 20.30 kg/m   Physical Exam Vitals and nursing note reviewed.  Constitutional:      General: She is not in acute distress.    Appearance: Normal appearance. She is normal weight. She is not ill-appearing.  HENT:     Head: Normocephalic.     Right Ear: External ear normal.     Left Ear: External ear normal.     Nose: Nose normal.     Mouth/Throat:     Mouth: Mucous membranes are moist.     Pharynx: Oropharynx is clear.  Eyes:     Extraocular Movements: Extraocular movements intact.  Cardiovascular:     Rate and Rhythm: Normal rate and regular rhythm.     Pulses: Normal pulses.     Heart  sounds: Normal heart sounds.  Pulmonary:     Effort: Pulmonary effort is normal. No respiratory distress.     Breath sounds: Normal breath sounds. No wheezing or rhonchi.  Abdominal:     General: Bowel sounds are normal.     Palpations: Abdomen is soft.     Tenderness: There is no abdominal tenderness. There is no guarding.  Musculoskeletal:     Cervical back: Normal range of motion.     Right lower leg: No edema.     Left lower leg: No edema.  Skin:    General: Skin is warm and dry.     Capillary Refill: Capillary refill takes less than 2 seconds.  Neurological:     General: No focal deficit present.     Mental Status: She is alert and oriented to person, place, and time. Mental status is at baseline.  Psychiatric:        Mood and Affect: Mood normal.     ED Results / Procedures / Treatments   Labs (all labs ordered are listed, but only abnormal results are displayed) Labs Reviewed  BASIC METABOLIC PANEL - Abnormal; Notable for the following components:      Result Value   Glucose, Bld 126 (*)    All other components within normal limits  TROPONIN I (HIGH SENSITIVITY) - Abnormal; Notable for the following components:   Troponin I (High Sensitivity) 44 (*)    All other components within normal limits  TROPONIN I (HIGH SENSITIVITY) - Abnormal; Notable for the following components:   Troponin I (High Sensitivity) 43 (*)    All other components within normal limits  CBC    EKG EKG Interpretation  Date/Time:  Thursday September 28 2019 17:19:09 EDT Ventricular Rate:  100 PR Interval:  150 QRS Duration: 80 QT Interval:  342 QTC Calculation: 441 R Axis:   52 Text Interpretation: Sinus rhythm with occasional Premature ventricular complexes Biatrial enlargement Left ventricular hypertrophy with repolarization abnormality ( Sokolow-Lyon , Romhilt-Estes ) Abnormal ECG When compared with ECG of 09/11/2019, Premature ventricular complexes are now present Confirmed by 11/11/2019  (Dione Booze) on 09/28/2019 9:18:28 PM   Radiology DG Chest 2 View  Result Date: 09/28/2019 CLINICAL DATA:  Chest pain EXAM: CHEST - 2 VIEW COMPARISON:  05/11/2019 FINDINGS: The heart size and mediastinal contours are within normal limits. Atherosclerotic calcification of the aortic knob. Mildly hyperexpanded lungs. Right apical scarring. No  focal airspace consolidation, pleural effusion, or pneumothorax. The visualized skeletal structures are unremarkable. IMPRESSION: No active cardiopulmonary disease. Electronically Signed   By: Duanne Guess D.O.   On: 09/28/2019 17:54    Procedures Procedures (including critical care time)  Medications Ordered in ED Medications  sodium chloride flush (NS) 0.9 % injection 3 mL (3 mLs Intravenous Not Given 09/28/19 2101)    ED Course  I have reviewed the triage vital signs and the nursing notes.  Pertinent labs & imaging results that were available during my care of the patient were reviewed by me and considered in my medical decision making (see chart for details).    MDM Rules/Calculators/A&P                      74 year old female with a past medical history of CAD status post 3 stents in 2013 in 2017, carotid artery disease, CHF, HTN presenting to the emergency department brought in by EMS from home complaining of palpitations and pain in the center of her chest  Differential diagnoses considered include dysrhythmia, ACS, PVCs, less likely PE, dissection, pneumothorax, PNA, panic attack, stress-induced palpitations  ECG interpreted by me demonstrated Sinus rhythm with occasional PVCs 100 bpm, normal axis, normal intervals, biatrial enlargement, LVH with repull abnormality, no ST changes suggestive of acute ischemia, overall similar compared to previous on 09/11/2019  CXR interpreted by me demonstrated without acute cardiopulmonary processes   Labs demonstrated no significant derangements on BMP, no significant arrangements on CBC, Initial troponin  44, delta trop 43  Patient reports having a close follow-up appointment with her cardiologist in the next week.  Strongly encouraged the patient to maintain this appointment for reassessment and reconsideration of any additional testing needs.  Provide patient with strict return precautions.  The patient is safe and stable for discharge at this time with return precautions provided and a plan for follow up care in place as needed  The plan for this patient was discussed with Dr. Preston Fleeting, who voiced agreement and who oversaw evaluation and treatment of this patient.  Final Clinical Impression(s) / ED Diagnoses Final diagnoses:  Palpitations    Rx / DC Orders ED Discharge Orders    None       Gracy Bruins, MD 09/28/19 2219    Dione Booze, MD 09/28/19 2312

## 2019-09-29 ENCOUNTER — Ambulatory Visit: Payer: Medicare Other | Admitting: Allergy

## 2019-09-30 ENCOUNTER — Emergency Department (HOSPITAL_BASED_OUTPATIENT_CLINIC_OR_DEPARTMENT_OTHER)
Admission: EM | Admit: 2019-09-30 | Discharge: 2019-09-30 | Disposition: A | Discharge status: Home / Self Care | Payer: Medicare Other | Attending: Emergency Medicine | Admitting: Emergency Medicine

## 2019-09-30 ENCOUNTER — Encounter (HOSPITAL_BASED_OUTPATIENT_CLINIC_OR_DEPARTMENT_OTHER): Payer: Self-pay | Admitting: Emergency Medicine

## 2019-09-30 ENCOUNTER — Other Ambulatory Visit: Payer: Self-pay

## 2019-09-30 DIAGNOSIS — J301 Allergic rhinitis due to pollen: Secondary | ICD-10-CM | POA: Diagnosis not present

## 2019-09-30 DIAGNOSIS — R82998 Other abnormal findings in urine: Secondary | ICD-10-CM | POA: Insufficient documentation

## 2019-09-30 DIAGNOSIS — Z79899 Other long term (current) drug therapy: Secondary | ICD-10-CM | POA: Insufficient documentation

## 2019-09-30 DIAGNOSIS — H53142 Visual discomfort, left eye: Secondary | ICD-10-CM | POA: Diagnosis not present

## 2019-09-30 DIAGNOSIS — I110 Hypertensive heart disease with heart failure: Secondary | ICD-10-CM | POA: Insufficient documentation

## 2019-09-30 DIAGNOSIS — J302 Other seasonal allergic rhinitis: Secondary | ICD-10-CM

## 2019-09-30 DIAGNOSIS — H579 Unspecified disorder of eye and adnexa: Secondary | ICD-10-CM

## 2019-09-30 DIAGNOSIS — R413 Other amnesia: Secondary | ICD-10-CM | POA: Insufficient documentation

## 2019-09-30 DIAGNOSIS — H53141 Visual discomfort, right eye: Secondary | ICD-10-CM | POA: Diagnosis not present

## 2019-09-30 DIAGNOSIS — T7840XA Allergy, unspecified, initial encounter: Secondary | ICD-10-CM | POA: Diagnosis present

## 2019-09-30 DIAGNOSIS — Z7902 Long term (current) use of antithrombotics/antiplatelets: Secondary | ICD-10-CM | POA: Insufficient documentation

## 2019-09-30 DIAGNOSIS — Z87891 Personal history of nicotine dependence: Secondary | ICD-10-CM | POA: Insufficient documentation

## 2019-09-30 DIAGNOSIS — I5022 Chronic systolic (congestive) heart failure: Secondary | ICD-10-CM | POA: Diagnosis not present

## 2019-09-30 DIAGNOSIS — R410 Disorientation, unspecified: Secondary | ICD-10-CM | POA: Diagnosis not present

## 2019-09-30 DIAGNOSIS — Z23 Encounter for immunization: Secondary | ICD-10-CM | POA: Diagnosis not present

## 2019-09-30 LAB — URINALYSIS, MICROSCOPIC (REFLEX): RBC / HPF: NONE SEEN RBC/hpf (ref 0–5)

## 2019-09-30 LAB — URINALYSIS, ROUTINE W REFLEX MICROSCOPIC
Bilirubin Urine: NEGATIVE
Glucose, UA: NEGATIVE mg/dL
Hgb urine dipstick: NEGATIVE
Ketones, ur: NEGATIVE mg/dL
Nitrite: NEGATIVE
Protein, ur: 30 mg/dL — AB
Specific Gravity, Urine: <1.005 — ABNORMAL LOW (ref 1.005–1.030)
pH: 6 (ref 5.0–8.0)

## 2019-09-30 MED ORDER — TETRACAINE HCL 0.5 % OP SOLN
1.0000 [drp] | Freq: Once | OPHTHALMIC | Status: AC
  Administered 2019-09-30: 1 [drp] via OPHTHALMIC
  Filled 2019-09-30: qty 4

## 2019-09-30 MED ORDER — TETANUS-DIPHTH-ACELL PERTUSSIS 5-2.5-18.5 LF-MCG/0.5 IM SUSP
0.5000 mL | Freq: Once | INTRAMUSCULAR | Status: AC
  Administered 2019-09-30: 0.5 mL via INTRAMUSCULAR
  Filled 2019-09-30: qty 0.5

## 2019-09-30 MED ORDER — FLUORESCEIN SODIUM 1 MG OP STRP
1.0000 | ORAL_STRIP | Freq: Once | OPHTHALMIC | Status: AC
  Administered 2019-09-30: 1 via OPHTHALMIC
  Filled 2019-09-30: qty 1

## 2019-09-30 MED ORDER — CETIRIZINE HCL 5 MG PO TABS: 5.0000 mg | ORAL_TABLET | Freq: Every day | ORAL | 0 refills | Status: DC

## 2019-09-30 MED ORDER — MUCINEX 600 MG PO TB12
600.0000 mg | ORAL_TABLET | Freq: Two times a day (BID) | ORAL | 0 refills | Status: DC | PRN
Start: 2019-09-30 — End: 2021-01-01

## 2019-09-30 NOTE — ED Provider Notes (Signed)
[facility] EMERGENCY DEPARTMENT Provider Note   CSN: [id number] Arrival date & time: 09/30/19  1444     History Chief Complaint  Patient presents with  . Allergies    Sara Villanueva is a 74 y.o. female with history of arthritis, coronary artery disease, CHF, hypertension, rheumatoid arthritis, hyperlipidemia presents with complaint of acute onset, resolved eye irritation.  She reports that earlier today at around 1 PM she was applying peanut oil to her face for her eczema which is prescribed to her by her dermatologist when the oil got in both eyes.  She reports mild irritation initially and flushed out her eyes with tap water.  She denies blurred vision, double vision, photophobia, headaches, fevers, pain with eye movements.  She does not wear contact lenses.  Her tetanus is not up-to-date.  She states she is here "just to get checked out".  Of note, she states that she has "very bad" allergies for which she has lately been taking 2 tablets of Claritin daily, nasal spray.   Patient's daughter pulled me aside in the hallway and states that she is concerned because her mother has been acting a bit erratically lately.  She states that she took the labels off some of her medications and is concerned that she may not be taking them appropriately.  She also notes that her neurologist increased one of her memory medications and then the patient told her daughter that another of her physicians also increase one of her medications.  She is also been taking meclizine for vertigo.  Overall patient's daughter is concerned that she may be having side effects to her medications.  She has an appointment to see the PCP in a few days but when she called her PCP recommended obtaining a UA and culture to rule out UTI.  The patient was recently seen at [facility] on 09/28/2019 but at the time a urine culture and UA were ordered but never obtained and the patient denied any urinary symptoms.  Daughter  states that she does feel the patient is safe at home with her and her husband and that she has not had any strokelike or infectious symptoms so "if she refuses the urine today it will be okay but I just hope that we can get it so that we can follow-up on the results with the doctor".   The history is provided by the patient and a relative.       Past Medical History:  Diagnosis Date  . Arthritis   . Bladder disorder   . CAD (coronary artery disease)    stents x 3, 2013, 2017  . Carotid arterial disease (HCC)   . CHF (congestive heart failure) (HCC)   . Colon polyps   . High cholesterol   . Hypertension   . Lower GI bleed   . Rheumatoid arteritis (HCC)   . Rheumatoid arthritis [facility])     Patient Active Problem List   Diagnosis Date Noted  . Chronic systolic congestive heart failure (HCC) 05/12/2019  . Memory loss 03/21/2019  . Near syncope 02/25/2019  . UTI due to extended-spectrum beta lactamase (ESBL) producing Escherichia coli 02/25/2019  . Urinary retention with incomplete bladder emptying 02/25/2019  . Voiding dysfunction 02/14/2019  . Venous insufficiency (chronic) (peripheral) 12/08/2018  . 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 Mild cognitive impairment with memory loss 09/21/2018  . Acute cystitis without hematuria 09/09/2018  . Heme positive stool 03/28/2018  . S/P  carotid endarterectomy 12/27/2017  . Chronic constipation 10/18/2017  . History of colon polyps 10/18/2017  . 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 Acute recurrent sinusitis 03/02/2017  . Anemia, unspecified 01/31/2016  . Knee pain, left 08/05/2015  . Allergic rhinitis 07/15/2015  . Stenosis of carotid artery 07/15/2015  . PVD (peripheral vascular disease) (HCC) 07/15/2015  . Renal artery stenosis (HCC) 07/15/2015  . Bilateral carotid artery stenosis 07/15/2015  . 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 Essential (primary) hypertension 06/13/2015  . HLD  (hyperlipidemia) 06/13/2015  . Urgency of micturation 05/03/2015  . Urination frequency 05/03/2015  . Pedal edema 12/04/2014  . Dysesthesia 12/04/2014  . Nuclear sclerotic cataract 09/20/2014  . Lumbar radiculopathy 08/16/2014  . Spondylolisthesis at L5-S1 level 08/07/2014  . Neck pain 08/07/2014  . Chronic rheumatic arthritis (HCC) 08/07/2014  . Rheumatoid arthritis (HCC) 08/07/2014  . Angina pectoris (HCC) 08/03/2014  . Aseptic bony necrosis (HCC) 08/03/2014  . Aseptic necrosis of bone (HCC) 08/03/2014    Past Surgical History:  Procedure Laterality Date  . CAROTID ENDARTERECTOMY    . CORONARY ANGIOPLASTY WITH STENT PLACEMENT       OB History   No obstetric history on file.     Family History  Problem Relation Age of Onset  . Heart disease Mother   . Heart disease Father     Social History   Tobacco Use  . Smoking status: Former Smoker    Packs/day: 0.50    Years: 25.00    Pack years: 12.50    Types: Cigarettes    Quit date: 2000    Years since quitting: 21.3  . Smokeless tobacco: Never Used  Substance Use Topics  . Alcohol use: No    Alcohol/week: 0.0 standard drinks  . Drug use: No    Home Medications Prior to Admission medications   Medication Sig Start Date End Date Taking? Authorizing Provider  ascorbic acid (VITAMIN C) 100 MG tablet Take by mouth.    [provider]  busPIRone (BUSPAR) 15 MG tablet Take 15 mg by mouth 3 (three) times daily.  08/01/14   [provider]  carvedilol (COREG) 3.125 MG tablet Take 3.125 mg by mouth 2 (two) times daily. 06/21/19   [provider]  cetirizine (ZYRTEC) 5 MG tablet Take 1 tablet (5 mg total) by mouth daily. 09/30/19   Jontae Sonier A, PA-C  clobetasol cream (TEMOVATE) 0.05 % APPLY CREAM TOPICALLY TO AFFECTED AREA TWICE DAILY 07/31/19   [provider]  clopidogrel (PLAVIX) 75 MG tablet Take 75 mg by mouth daily.    [provider]  dicyclomine (BENTYL) 10 MG capsule Take 10  mg by mouth 4 (four) times daily as needed. 09/25/19   [provider]  donepezil (ARICEPT) 10 MG tablet Take 1 tablet (10 mg total) by mouth at bedtime. 09/20/19   Sater, Pearletha Furl, MD  ferrous sulfate 325 (65 FE) MG tablet Take by mouth.    [provider]  gabapentin (NEURONTIN) 800 MG tablet TAKE 1 TABLET BY MOUTH THREE TIMES DAILY Patient not taking: Reported on 09/28/2019 01/30/19   Sater, Pearletha Furl, MD  guaiFENesin (MUCINEX) 600 MG 12 hr tablet Take 1 tablet (600 mg total) by mouth 2 (two) times daily as needed for cough. 09/30/19   Nisaiah Bechtol A, PA-C  halobetasol (ULTRAVATE) 0.05 % cream APPLY CREAM TOPICALLY TWICE DAILY 09/27/19   [provider]  ibuprofen (ADVIL) 800 MG tablet Take by mouth. 07/22/19  [provider]  linaclotide (LINZESS) 145 MCG CAPS capsule Take 145 mcg by mouth once a week.  09/07/19   [provider]  meclizine (ANTIVERT) 12.5 MG tablet Take 1 tablet (12.5 mg total) by mouth 3 (three) times daily as needed for dizziness. Patient not taking: Reported on 09/28/2019 09/11/19   Dorie Rank, MD  memantine (NAMENDA) 5 MG tablet Take 5 mg by mouth 2 (two) times daily. 09/10/19   [provider]  NITROSTAT 0.4 MG SL tablet Place 0.4 mg under the tongue every 5 (five) minutes as needed for chest pain.  07/08/14   [provider]  pravastatin (PRAVACHOL) 40 MG tablet Take 40 mg by mouth daily.    [provider]  predniSONE (STERAPRED UNI-PAK 48 TAB) 10 MG (48) TBPK tablet  07/11/19   [provider]  tamsulosin (FLOMAX) 0.4 MG CAPS capsule Take 1 capsule (0.4 mg total) by mouth daily. Patient not taking: Reported on 09/28/2019 01/19/19   Palumbo, April, MD    Allergies    Other, Lamotrigine, Latex, and Nickel  Review of Systems   Review of Systems  Constitutional: Negative for fever.  Eyes: Negative for photophobia, pain, discharge, redness and visual disturbance.  Neurological: Negative for headaches.    Psychiatric/Behavioral: Positive for confusion.  All other systems reviewed and are negative.   Physical Exam Updated Vital Signs BP 112/88 (BP Location: Left Arm)   Temp 98.3 F (36.8 C) (Oral)   Resp 20   SpO2 100%   Physical Exam Vitals and nursing note reviewed.  Constitutional:      General: She is not in acute distress.    Appearance: She is well-developed.  HENT:     Head: Normocephalic and atraumatic.  Eyes:     General:        Right eye: No discharge.        Left eye: No discharge.     Extraocular Movements: Extraocular movements intact.     Conjunctiva/sclera: Conjunctivae normal.     Pupils: Pupils are equal, round, and reactive to light.     Comments: No conjunctival injection.  No chemosis, proptosis, or consensual photophobia.  No foreign bodies noted.  No periorbital erythema or edema.    Visual Acuity  Right Eye Distance:   Left Eye Distance:   Bilateral Distance:    Right Eye Near: R Near: 20/20 uncorrected Left Eye Near:  L Near: 20/20 uncorrected Bilateral Near:  20/20 uncorrected  pH of both eyes is 7.0 on fluorescein stain no evidence of foreign body, corneal abrasion or ulceration, rust ring or dendritic lesion.  Neck:     Vascular: No JVD.     Trachea: No tracheal deviation.  Cardiovascular:     Rate and Rhythm: Normal rate.  Pulmonary:     Effort: Pulmonary effort is normal.  Abdominal:     General: There is no distension.     Tenderness: There is no abdominal tenderness.  Musculoskeletal:     Cervical back: Neck supple.  Skin:    General: Skin is warm and dry.     Findings: No erythema.  Neurological:     Mental Status: She is alert.     Comments: Speech is fluent with no evidence of dysarthria or aphasia, no facial droop, moves all extremities spontaneous without difficulty.  Ambulatory with steady gait and balance.  Cranial nerves appear grossly intact.  Psychiatric:        Behavior: Behavior normal.     ED  Results /  Procedures / Treatments   Labs (all labs ordered are listed, but only abnormal results are displayed) Labs Reviewed  URINALYSIS, ROUTINE W REFLEX MICROSCOPIC - Abnormal; Notable for the following components:      Result Value   Color, Urine STRAW (*)    Specific Gravity, Urine <1.005 (*)    Protein, ur 30 (*)    Leukocytes,Ua SMALL (*)    All other components within normal limits  URINALYSIS, MICROSCOPIC (REFLEX) - Abnormal; Notable for the following components:   Bacteria, UA FEW (*)    All other components within normal limits  URINE CULTURE    EKG None  Radiology DG Chest 2 View  Result Date: 09/28/2019 CLINICAL DATA:  Chest pain EXAM: CHEST - 2 VIEW COMPARISON:  05/11/2019 FINDINGS: The heart size and mediastinal contours are within normal limits. Atherosclerotic calcification of the aortic knob. Mildly hyperexpanded lungs. Right apical scarring. No focal airspace consolidation, pleural effusion, or pneumothorax. The visualized skeletal structures are unremarkable. IMPRESSION: No active cardiopulmonary disease. Electronically Signed   By: Duanne Guess D.O.   On: 09/28/2019 17:54    Procedures Procedures (including critical care time)  Medications Ordered in ED Medications  tetracaine (PONTOCAINE) 0.5 % ophthalmic solution 1 drop (1 drop Both Eyes Given by Other 09/30/19 1536)  fluorescein ophthalmic strip 1 strip (1 strip Both Eyes Given by Other 09/30/19 1536)  Tdap (BOOSTRIX) injection 0.5 mL (0.5 mLs Intramuscular Given 09/30/19 1534)    ED Course  I have reviewed the triage vital signs and the nursing notes.  Pertinent labs & imaging results that were available during my care of the patient were reviewed by me and considered in my medical decision making (see chart for details).    MDM Rules/Calculators/A&P                      Patient presenting for evaluation requesting eye exam after exposure to peanut oil in the eyes bilaterally. She is afebrile, vital signs are  stable. She is nontoxic in appearance. She clinically appears well. Physical examination is reassuring with normal visual acuity, no evidence of periorbital erythema or edema. Fluorescein stain is reassuring. No evidence of ocular nerve entrapment. pH is within normal limits. No concern for serious chemical exposure. She did irrigate her eyes at home prior to arrival. We discussed management of her seasonal allergies, recommend discontinuing Claritin which she is taking 2 tablets of daily.  I did speak with the patient's daughter outside of the room at her request. She is concerned that the patient has had some mildly erratic behaviors over the last few days and her PCP recommended obtaining a UA to rule out UTI. The patient denies any urinary symptoms. The daughter is clear that she feels that the patient is safe at home. The patient has an appointment to see her PCP earlier this week to discuss her medications, possible polypharmacy. UA today is equivocal for UTI, will hold off on any treatment at this time and elected to culture instead. Discussed strict ED return precautions with patient. She verbalized understanding of and agreement with plan and is stable for discharge at this time.  Final Clinical Impression(s) / ED Diagnoses Final diagnoses:  Eye problem  Seasonal allergies    Rx / DC Orders ED Discharge Orders         Ordered    cetirizine (ZYRTEC) 5 MG tablet  Daily     09/30/19 1624    guaiFENesin (MUCINEX)  600 MG 12 hr tablet  2 times daily PRN     09/30/19 1624           Jeanie Sewer, PA-C 09/30/19 1704    Geoffery Lyons, MD 10/01/19 1455

## 2019-09-30 NOTE — ED Triage Notes (Addendum)
"   My doctor told me to put peanut oil on my face because it is good for the skin" Accidentally got peanut oil in eyes abut 12n today and my eyes are irritated " Took 2 Claritin tabs PTA

## 2019-09-30 NOTE — Discharge Instructions (Addendum)
Your eye exam today was reassuring.  Your tetanus was updated.  Your urine sample was reassuring with no significant signs of infection.  I would recommend discontinuing the Claritin.  You can use Zyrtec 1 tablet once daily.  Avoid Benadryl or other antihistamines while you are taking this.  Continue using the Nasacort nasal spray.  You can also use nasal saline to help break up mucousy secretions as well as humidifiers and steam showers to help with congestion.  You can also use Mucinex (with no dextromethorphan) with a full glass of water.  Drink plenty fluids and get rest.  Follow-up with your primary care provider later this week as scheduled for reevaluation of your symptoms.  Return to the emergency department if any concerning signs or symptoms develop such as fevers, severe eye pain, vision loss, eyelid swelling, or loss of consciousness

## 2019-10-02 LAB — URINE CULTURE: Culture: <10000 — AB

## 2019-10-07 ENCOUNTER — Emergency Department (HOSPITAL_COMMUNITY): Payer: Medicare Other

## 2019-10-07 ENCOUNTER — Other Ambulatory Visit: Payer: Self-pay

## 2019-10-07 ENCOUNTER — Emergency Department (HOSPITAL_COMMUNITY)
Admission: EM | Admit: 2019-10-07 | Discharge: 2019-10-08 | Disposition: A | Discharge status: Home / Self Care | Payer: Medicare Other | Attending: Emergency Medicine | Admitting: Emergency Medicine

## 2019-10-07 DIAGNOSIS — I5022 Chronic systolic (congestive) heart failure: Secondary | ICD-10-CM | POA: Diagnosis not present

## 2019-10-07 DIAGNOSIS — Z91040 Latex allergy status: Secondary | ICD-10-CM | POA: Diagnosis not present

## 2019-10-07 DIAGNOSIS — R0789 Other chest pain: Secondary | ICD-10-CM | POA: Diagnosis not present

## 2019-10-07 DIAGNOSIS — I2510 Atherosclerotic heart disease of native coronary artery without angina pectoris: Secondary | ICD-10-CM | POA: Insufficient documentation

## 2019-10-07 DIAGNOSIS — Z87891 Personal history of nicotine dependence: Secondary | ICD-10-CM | POA: Insufficient documentation

## 2019-10-07 DIAGNOSIS — Z79899 Other long term (current) drug therapy: Secondary | ICD-10-CM | POA: Insufficient documentation

## 2019-10-07 DIAGNOSIS — R079 Chest pain, unspecified: Secondary | ICD-10-CM

## 2019-10-07 DIAGNOSIS — I110 Hypertensive heart disease with heart failure: Secondary | ICD-10-CM | POA: Insufficient documentation

## 2019-10-07 LAB — CBC
HCT: 50.9 % — ABNORMAL HIGH (ref 36.0–46.0)
Hemoglobin: 16.5 g/dL — ABNORMAL HIGH (ref 12.0–15.0)
MCH: 32 pg (ref 26.0–34.0)
MCHC: 32.4 g/dL (ref 30.0–36.0)
MCV: 98.8 fL (ref 80.0–100.0)
Platelets: 196 10*3/uL (ref 150–400)
RBC: 5.15 MIL/uL — ABNORMAL HIGH (ref 3.87–5.11)
RDW: 14 % (ref 11.5–15.5)
WBC: 6.9 10*3/uL (ref 4.0–10.5)
nRBC: 0 % (ref 0.0–0.2)

## 2019-10-07 LAB — TROPONIN I (HIGH SENSITIVITY)
Troponin I (High Sensitivity): 34 ng/L — ABNORMAL HIGH (ref <18)
Troponin I (High Sensitivity): 49 ng/L — ABNORMAL HIGH (ref <18)

## 2019-10-07 LAB — BASIC METABOLIC PANEL
Anion gap: 13 (ref 5–15)
BUN: 15 mg/dL (ref 8–23)
CO2: 23 mmol/L (ref 22–32)
Calcium: 9.5 mg/dL (ref 8.9–10.3)
Chloride: 104 mmol/L (ref 98–111)
Creatinine, Ser: 0.85 mg/dL (ref 0.44–1.00)
GFR calc Af Amer: >60 mL/min (ref >60)
GFR calc non Af Amer: >60 mL/min (ref >60)
Glucose, Bld: 79 mg/dL (ref 70–99)
Potassium: 4.5 mmol/L (ref 3.5–5.1)
Sodium: 140 mmol/L (ref 135–145)

## 2019-10-07 LAB — CBG MONITORING, ED: Glucose-Capillary: 87 mg/dL (ref 70–99)

## 2019-10-07 MED ORDER — ASPIRIN 81 MG PO CHEW
324.0000 mg | CHEWABLE_TABLET | Freq: Once | ORAL | Status: AC
  Administered 2019-10-07: 324 mg via ORAL
  Filled 2019-10-07: qty 4

## 2019-10-07 NOTE — ED Notes (Signed)
Staff confirmed pt's intentions to stay at home with family multiple times. Intent was conveyed to MD as well as family plans to follow up with PCP to acquire [facility] RN. Pt to be discharged home with daughter.

## 2019-10-07 NOTE — ED Notes (Signed)
Sara Villanueva, husband, [phone]

## 2019-10-07 NOTE — ED Triage Notes (Signed)
Pt reports that she has stress at home with family and arguments. Does have hx of heart problems and stent placement and per EMS repeatedly calls d/t issues at home.  Police called as well and she finally agreed to come to the ED.  Denies any CP, states it occurred yesterday lasted all day long with no relief.  Was seen at [facility].   Pt stops during assessment and changes her story completely. Able to settle her anxiety and get history straight.  No CP at present, no SOB, abd pain or N/V/D.

## 2019-10-07 NOTE — ED Notes (Signed)
Sara Villanueva, daughter, [phone] would like an update when available

## 2019-10-07 NOTE — ED Provider Notes (Signed)
[facility] EMERGENCY DEPARTMENT Provider Note   CSN: [id number] Arrival date & time: 10/07/19  1251     History Chest pain  Brooklee Michelin is a 74 y.o. female.  HPI  HPI: A 74 year old patient with a history of hypertension and hypercholesterolemia presents for evaluation of chest pain. Initial onset of pain was approximately 1-3 hours ago. The patient's chest pain is sharp and is not worse with exertion. The patient reports some diaphoresis. The patient's chest pain is middle- or left-sided, is not well-localized, is not described as heaviness/pressure/tightness and does not radiate to the arms/jaw/neck. The patient does not complain of nausea. The patient has no history of stroke, has no history of peripheral artery disease, has not smoked in the past 90 days, denies any history of treated diabetes, has no relevant family history of coronary artery disease (first degree relative at less than age 41) and does not have an elevated BMI (>=30). Pt states she has been having a lot of stress and arguments at home.  Pt has been calling EMS because of difficulty at home.  Pt's daughter would not leave. Pt has been having episodes of chest pain.  Pt spoke to police.  She did not want to press charges against daughter.   Police and EMS convinced pt to come to the ED.  Pt was seen at [facility] last night for same issue.  Pt denies any chest pain currently.  Past Medical History:  Diagnosis Date  . Arthritis   . Bladder disorder   . CAD (coronary artery disease)    stents x 3, 2013, 2017  . Carotid arterial disease (HCC)   . CHF (congestive heart failure) (HCC)   . Colon polyps   . High cholesterol   . Hypertension   . Lower GI bleed   . Rheumatoid arteritis (HCC)   . Rheumatoid arthritis [facility])     Patient Active Problem List   Diagnosis Date Noted  . Chronic systolic congestive heart failure (HCC) 05/12/2019  . Memory loss 03/21/2019  . Near syncope 02/25/2019  . UTI due to  extended-spectrum beta lactamase (ESBL) producing Escherichia coli 02/25/2019  . Urinary retention with incomplete bladder emptying 02/25/2019  . Voiding dysfunction 02/14/2019  . Venous insufficiency (chronic) (peripheral) 12/08/2018  . 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 Mild cognitive impairment with memory loss 09/21/2018  . Acute cystitis without hematuria 09/09/2018  . Heme positive stool 03/28/2018  . S/P carotid endarterectomy 12/27/2017  . Chronic constipation 10/18/2017  . History of colon polyps 10/18/2017  . 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 Acute recurrent sinusitis 03/02/2017  . Anemia, unspecified 01/31/2016  . Knee pain, left 08/05/2015  . Allergic rhinitis 07/15/2015  . Stenosis of carotid artery 07/15/2015  . PVD (peripheral vascular disease) (HCC) 07/15/2015  . Renal artery stenosis (HCC) 07/15/2015  . Bilateral carotid artery stenosis 07/15/2015  . 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 Essential (primary) hypertension 06/13/2015  . HLD (hyperlipidemia) 06/13/2015  . Urgency of micturation 05/03/2015  . Urination frequency 05/03/2015  . Pedal edema 12/04/2014  . Dysesthesia 12/04/2014  . Nuclear sclerotic cataract 09/20/2014  . Lumbar radiculopathy 08/16/2014  . Spondylolisthesis at L5-S1 level 08/07/2014  . Neck pain 08/07/2014  . Chronic rheumatic arthritis (HCC) 08/07/2014  . Rheumatoid arthritis (HCC) 08/07/2014  . Angina pectoris (HCC) 08/03/2014  . Aseptic bony necrosis (HCC) 08/03/2014  . Aseptic necrosis of bone (HCC) 08/03/2014    Past  Surgical History:  Procedure Laterality Date  . CAROTID ENDARTERECTOMY    . CORONARY ANGIOPLASTY WITH STENT PLACEMENT       OB History   No obstetric history on file.     Family History  Problem Relation Age of Onset  . Heart disease Mother   . Heart disease Father     Social History   Tobacco Use  . Smoking status: Former Smoker    Packs/day:  0.50    Years: 25.00    Pack years: 12.50    Types: Cigarettes    Quit date: 2000    Years since quitting: 21.3  . Smokeless tobacco: Never Used  Substance Use Topics  . Alcohol use: No    Alcohol/week: 0.0 standard drinks  . Drug use: No    Home Medications Prior to Admission medications   Medication Sig Start Date End Date Taking? Authorizing Provider  ascorbic acid (VITAMIN C) 100 MG tablet Take by mouth.    [provider]  busPIRone (BUSPAR) 15 MG tablet Take 15 mg by mouth 3 (three) times daily.  08/01/14   [provider]  carvedilol (COREG) 3.125 MG tablet Take 3.125 mg by mouth 2 (two) times daily. 06/21/19   [provider]  cetirizine (ZYRTEC) 5 MG tablet Take 1 tablet (5 mg total) by mouth daily. 09/30/19   Fawze, Mina A, PA-C  clobetasol cream (TEMOVATE) 0.05 % APPLY CREAM TOPICALLY TO AFFECTED AREA TWICE DAILY 07/31/19   [provider]  clopidogrel (PLAVIX) 75 MG tablet Take 75 mg by mouth daily.    [provider]  dicyclomine (BENTYL) 10 MG capsule Take 10 mg by mouth 4 (four) times daily as needed. 09/25/19   [provider]  donepezil (ARICEPT) 10 MG tablet Take 1 tablet (10 mg total) by mouth at bedtime. 09/20/19   Sater, Pearletha Furl, MD  ferrous sulfate 325 (65 FE) MG tablet Take by mouth.    [provider]  gabapentin (NEURONTIN) 800 MG tablet TAKE 1 TABLET BY MOUTH THREE TIMES DAILY Patient not taking: Reported on 09/28/2019 01/30/19   Sater, Pearletha Furl, MD  guaiFENesin (MUCINEX) 600 MG 12 hr tablet Take 1 tablet (600 mg total) by mouth 2 (two) times daily as needed for cough. 09/30/19   Fawze, Mina A, PA-C  halobetasol (ULTRAVATE) 0.05 % cream APPLY CREAM TOPICALLY TWICE DAILY 09/27/19   [provider]  ibuprofen (ADVIL) 800 MG tablet Take by mouth. 07/22/19   [provider]  linaclotide (LINZESS) 145 MCG CAPS capsule Take 145 mcg by mouth once a week.  09/07/19   [provider]  meclizine  (ANTIVERT) 12.5 MG tablet Take 1 tablet (12.5 mg total) by mouth 3 (three) times daily as needed for dizziness. Patient not taking: Reported on 09/28/2019 09/11/19   Linwood Dibbles, MD  memantine (NAMENDA) 5 MG tablet Take 5 mg by mouth 2 (two) times daily. 09/10/19   [provider]  NITROSTAT 0.4 MG SL tablet Place 0.4 mg under the tongue every 5 (five) minutes as needed for chest pain.  07/08/14   [provider]  pravastatin (PRAVACHOL) 40 MG tablet Take 40 mg by mouth daily.    [provider]  predniSONE (STERAPRED UNI-PAK 48 TAB) 10 MG (48) TBPK tablet  07/11/19   [provider]  tamsulosin (FLOMAX) 0.4 MG CAPS capsule Take 1 capsule (0.4 mg total) by mouth daily. Patient not taking: Reported on 09/28/2019 01/19/19   Cy Blamer, MD  Allergies    Other, Lamotrigine, Latex, and Nickel  Review of Systems   Review of Systems  All other systems reviewed and are negative.   Physical Exam Updated Vital Signs BP 113/81 (BP Location: Left Arm)   Pulse (!) 107   Temp 98.3 F (36.8 C) (Oral)   Resp 16   Ht 1.727 m (5\' 8" )   Wt 65.8 kg   SpO2 100%   BMI 22.05 kg/m   Physical Exam Vitals and nursing note reviewed.  Constitutional:      General: She is not in acute distress.    Appearance: She is well-developed.  HENT:     Head: Normocephalic and atraumatic.     Right Ear: External ear normal.     Left Ear: External ear normal.  Eyes:     General: No scleral icterus.       Right eye: No discharge.        Left eye: No discharge.     Conjunctiva/sclera: Conjunctivae normal.  Neck:     Trachea: No tracheal deviation.  Cardiovascular:     Rate and Rhythm: Normal rate and regular rhythm.  Pulmonary:     Effort: Pulmonary effort is normal. No respiratory distress.     Breath sounds: Normal breath sounds. No stridor. No wheezing or rales.  Abdominal:     General: Bowel sounds are normal. There is no distension.     Palpations: Abdomen is soft.      Tenderness: There is no abdominal tenderness. There is no guarding or rebound.  Musculoskeletal:        General: No tenderness.     Cervical back: Neck supple.  Skin:    General: Skin is warm and dry.     Findings: No rash.  Neurological:     Mental Status: She is alert.     Cranial Nerves: No cranial nerve deficit (no facial droop, extraocular movements intact, no slurred speech).     Sensory: No sensory deficit.     Motor: No abnormal muscle tone or seizure activity.     Coordination: Coordination normal.     ED Results / Procedures / Treatments   Labs (all labs ordered are listed, but only abnormal results are displayed) Labs Reviewed  CBC - Abnormal; Notable for the following components:      Result Value   RBC 5.15 (*)    Hemoglobin 16.5 (*)    HCT 50.9 (*)    All other components within normal limits  TROPONIN I (HIGH SENSITIVITY) - Abnormal; Notable for the following components:   Troponin I (High Sensitivity) 34 (*)    All other components within normal limits  BASIC METABOLIC PANEL  CBG MONITORING, ED  TROPONIN I (HIGH SENSITIVITY)    EKG EKG Interpretation  Date/Time:  Saturday Oct 07 2019 13:06:12 EDT Ventricular Rate:  93 PR Interval:    QRS Duration: 93 QT Interval:  375 QTC Calculation: 467 R Axis:   47 Text Interpretation: Sinus rhythm Biatrial enlargement LVH with secondary repolarization abnormality No significant change since last tracing Confirmed by Dorie Rank [phone]) on 10/07/2019 1:44:53 PM   Radiology DG Chest Portable 1 View  Result Date: 10/07/2019 CLINICAL DATA:  Chest pain. Coronary artery disease. EXAM: PORTABLE CHEST 1 VIEW COMPARISON:  09/28/2019 FINDINGS: Stable mild cardiomegaly.  Aortic atherosclerosis noted. Right upper lobe scarring shows no significant change compared to previous study. No evidence of pulmonary infiltrate or edema. No evidence of pleural effusion. IMPRESSION: Stable mild cardiomegaly.  No active lung disease.  Electronically Signed   By: Danae Orleans M.D.   On: 10/07/2019 14:38    Procedures Procedures (including critical care time)  Medications Ordered in ED Medications  aspirin chewable tablet 324 mg (324 mg Oral Given 10/07/19 1410)    ED Course  I have reviewed the triage vital signs and the nursing notes.  Pertinent labs & imaging results that were available during my care of the patient were reviewed by me and considered in my medical decision making (see chart for details).  Clinical Course as of Oct 07 1535  Sat Oct 07, 2019  1523 Patient's high-sensitivity troponin is elevated at 34.  This is not changed compared to previous values.  We will proceed with delta troponin   [JK]    Clinical Course User Index [JK] Linwood Dibbles, MD   MDM Rules/Calculators/A&P HEAR Score: 5                    Pt presents with chest pain.  Primarily induced by stress after an argument with her daughter.  Sx are not typical anginal pain.  Pt does have history of CAD.  Initial trop elevated but similar to baseline.  Will check delta trop.  If negative anticipate pt can follow up with her cardiologist Final Clinical Impression(s) / ED Diagnoses Final diagnoses:  Chest pain, unspecified type      Linwood Dibbles, MD 10/07/19 1537

## 2019-10-07 NOTE — ED Notes (Signed)
Pt continues to wander halls, redirected pt multiple times to bed. Pt states, "I don't need no damn help getting around here". Educated pt on safety reasons for staying in bed.

## 2019-10-07 NOTE — ED Provider Notes (Signed)
2nd trop, if neg anticipate d/c Physical Exam  BP 113/81 (BP Location: Left Arm)   Pulse (!) 107   Temp 98.3 F (36.8 C) (Oral)   Resp 16   Ht 5\' 8"  (1.727 m)   Wt 65.8 kg   SpO2 100%   BMI 22.05 kg/m   Physical Exam  ED Course/Procedures   Clinical Course as of Oct 07 1598  Sat Oct 07, 2019  1523 Patient's high-sensitivity troponin is elevated at 34.  This is not changed compared to previous values.  We will proceed with delta troponin   [JK]    Clinical Course User Index [JK] Oct 09, 2019, MD    Procedures  MDM  Troponin without significant elevation at 2 hours.  Patient's baseline troponins are consistently in the 40s.  Atypical presentation for cardiac ischemia.  Will plan for discharge and close follow-up with PCP or cardiology.     Linwood Dibbles, MD 10/07/19 [phone]

## 2019-10-07 NOTE — ED Notes (Signed)
Pt wandering hallways, redirected to bed. Side rails up, bed in lowest position at this time.

## 2019-10-08 NOTE — ED Notes (Signed)
Patient verbalizes understanding of discharge instructions. Opportunity for questioning and answers were provided. Pt discharged from ED stable & ambulatory, will wait in lobby for daughter to pick up.

## 2019-10-11 ENCOUNTER — Telehealth: Payer: Self-pay | Admitting: Neurology

## 2019-10-11 MED ORDER — RISPERIDONE 1 MG PO TABS
1.00 | ORAL_TABLET | ORAL | Status: DC
Start: 2019-10-11 — End: 2019-10-11

## 2019-10-11 MED ORDER — ASPIRIN 81 MG PO TBEC
81.00 | DELAYED_RELEASE_TABLET | ORAL | Status: DC
Start: 2019-10-11 — End: 2019-10-11

## 2019-10-11 MED ORDER — CARVEDILOL 3.125 MG PO TABS
3.13 | ORAL_TABLET | ORAL | Status: DC
Start: 2019-10-11 — End: 2019-10-11

## 2019-10-11 MED ORDER — RISPERIDONE 1 MG PO TABS
0.50 | ORAL_TABLET | ORAL | Status: DC
Start: ? — End: 2019-10-11

## 2019-10-11 MED ORDER — FERROUS SULFATE 325 (65 FE) MG PO TABS
325.00 | ORAL_TABLET | ORAL | Status: DC
Start: 2019-10-11 — End: 2019-10-11

## 2019-10-11 MED ORDER — DONEPEZIL HCL 5 MG PO TABS
5.00 | ORAL_TABLET | ORAL | Status: DC
Start: 2019-10-11 — End: 2019-10-11

## 2019-10-11 MED ORDER — PRAVASTATIN SODIUM 40 MG PO TABS
40.00 | ORAL_TABLET | ORAL | Status: DC
Start: 2019-10-11 — End: 2019-10-11

## 2019-10-11 MED ORDER — HYDROXYZINE HCL 25 MG PO TABS
25.00 | ORAL_TABLET | ORAL | Status: DC
Start: ? — End: 2019-10-11

## 2019-10-11 MED ORDER — CLOPIDOGREL BISULFATE 75 MG PO TABS
75.00 | ORAL_TABLET | ORAL | Status: DC
Start: 2019-10-11 — End: 2019-10-11

## 2019-10-11 MED ORDER — ONDANSETRON 4 MG PO TBDP
4.00 | ORAL_TABLET | ORAL | Status: DC
Start: ? — End: 2019-10-11

## 2019-10-11 MED ORDER — BUSPIRONE HCL 15 MG PO TABS
15.00 | ORAL_TABLET | ORAL | Status: DC
Start: 2019-10-11 — End: 2019-10-11

## 2019-10-11 MED ORDER — GABAPENTIN 100 MG PO CAPS
100.00 | ORAL_CAPSULE | ORAL | Status: DC
Start: 2019-10-11 — End: 2019-10-11

## 2019-10-11 MED ORDER — MYCOPHENOLATE MOFETIL 500 MG PO TABS
500.00 | ORAL_TABLET | ORAL | Status: DC
Start: 2019-10-11 — End: 2019-10-11

## 2019-10-11 MED ORDER — ACETAMINOPHEN 325 MG PO TABS
650.00 | ORAL_TABLET | ORAL | Status: DC
Start: ? — End: 2019-10-11

## 2019-10-11 MED ORDER — OXYBUTYNIN CHLORIDE 5 MG PO TABS
5.00 | ORAL_TABLET | ORAL | Status: DC
Start: 2019-10-11 — End: 2019-10-11

## 2019-10-11 MED ORDER — MEMANTINE HCL 5 MG PO TABS
5.00 | ORAL_TABLET | ORAL | Status: DC
Start: 2019-10-11 — End: 2019-10-11

## 2019-10-11 NOTE — Telephone Encounter (Signed)
Called, LVM for daughter relaying Dr. Bonnita Hollow message.

## 2019-10-11 NOTE — Telephone Encounter (Signed)
Pt daughter erica called requesting a CB from RN states pt is currently admitted in the hospital and her behavior has changed and pt is acting erratic. Pt daughter states pt is admitted in [facility].

## 2019-10-11 NOTE — Telephone Encounter (Signed)
I looked at her hospital chart.   Unclear why she has had behavior change.  They should call to be seen in the office after discharge

## 2019-10-11 NOTE — Telephone Encounter (Signed)
Called daughter back to further discuss. She reports that her mother's behavior has been very erotic. She previously was checked for UTI but it was negative. She has been using profanity, called 911 and reported CP, stress. She had appt with PCP after this. They had nurse review her prescriptions. She started back taking gabapentin. She also is on prednisone for eczema. Was told her behavior could be r/t to prednisone she is taking. She is taking pills without knowing what she is taking. She was admitted to the hospital after being seen by PCP. Psychiatric team evaluated her. She saw mother yesterday and she seemed out of it. She was aware of who she was but her mother did not seem like herself. They started her on risperidone, unsure what dose. She was concerned about this because she looked it up and saw it was for schizophrenia. I explained that this medications can be used for other indications than schizophrenia. I recommended that when they are getting ready to discharge her, to ask if she should f/u with PCP/our office. If she needs to f/u she can call and we will get an appt scheduled. She has not seen neurology at the hospital yet.I recommended she discuss that with her MD there to see if they would like her evaluated by them.  She requested note be forwarded to Dr. Epimenio Foot and would appreciate him looking over hospital notes and to provide any recommendations. Advised I will forward message to him and we will call back.

## 2019-10-23 ENCOUNTER — Other Ambulatory Visit: Payer: Self-pay

## 2019-10-23 ENCOUNTER — Encounter: Payer: Self-pay | Admitting: Pediatrics

## 2019-10-23 ENCOUNTER — Ambulatory Visit (INDEPENDENT_AMBULATORY_CARE_PROVIDER_SITE_OTHER): Payer: Medicare Other | Admitting: Pediatrics

## 2019-10-23 VITALS — BP 130/80 | HR 110 | Temp 97.9°F | Resp 18 | Ht 68.0 in | Wt 134.0 lb

## 2019-10-23 DIAGNOSIS — I6529 Occlusion and stenosis of unspecified carotid artery: Secondary | ICD-10-CM

## 2019-10-23 DIAGNOSIS — R413 Other amnesia: Secondary | ICD-10-CM

## 2019-10-23 DIAGNOSIS — J301 Allergic rhinitis due to pollen: Secondary | ICD-10-CM

## 2019-10-23 DIAGNOSIS — Z8679 Personal history of other diseases of the circulatory system: Secondary | ICD-10-CM

## 2019-10-23 DIAGNOSIS — Z9889 Other specified postprocedural states: Secondary | ICD-10-CM

## 2019-10-23 DIAGNOSIS — H1010 Acute atopic conjunctivitis, unspecified eye: Secondary | ICD-10-CM | POA: Diagnosis not present

## 2019-10-23 DIAGNOSIS — L2089 Other atopic dermatitis: Secondary | ICD-10-CM | POA: Diagnosis not present

## 2019-10-23 DIAGNOSIS — M069 Rheumatoid arthritis, unspecified: Secondary | ICD-10-CM | POA: Diagnosis not present

## 2019-10-23 DIAGNOSIS — I10 Essential (primary) hypertension: Secondary | ICD-10-CM

## 2019-10-23 NOTE — Patient Instructions (Addendum)
Environmental control of dust and mite  Claritin 10 mg-take 1 tablet once a day if needed for runny nose or itchy eyes.  You may stop when you feel well in the summer..  You may buy the store brand called Loratadine instead of Zyrtec which would make you sleepy Nasacort 2 sprays per nostril once a day if needed for stuffy nose Visine AC-1 drop in each eye 3 times a day if needed for itchy eyes.  You may buy the store brand Call us if you are not doing well on this treatment plan  Continue on your other medications.  See your dermatologist this afternoon as previously scheduled

## 2019-10-23 NOTE — Progress Notes (Signed)
[address] [place] [address] Dept: [phone]  New Patient Note  Patient ID: Sara Villanueva, female    DOB: 08-Apr-1946  Age: 74 y.o. MRN: [id number] Date of Office Visit: 10/23/2019 Referring provider: Truett Perna, MD [address] [place],  [place] [address]    Chief Complaint: Allergies  HPI Sara Villanueva presents for an allergy evaluation.  She has had seasonal allergic rhinitis for several years.  Her symptoms have been much worse during the past few months.  She has had a runny nose, sneezing, stuffy nose and itchy eyes.  She was given some prednisone last week but stopped the prednisone when she did not feel well.  She has not had sinus infections.    She has a history of eczema beginning at age 21.  Her eczema has been worse during the past year and she is seeing her dermatologist this afternoon.  Her skin becomes irritated from many lotions and creams.  She is on Aquanil  HC lotion 1%  She has never had asthmatic symptoms.  She had one episode of pneumonia in the past.  She has not had gastroesophageal reflux.. She has had rheumatoid arthritis for several years and is on Plaquenil .  She used to be on infusions.  Her memory is not always accurate  Review of Systems  Constitutional: Negative.   HENT:       Seasonal allergic rhinitis for many years  Eyes:       Itchy eyes  Respiratory: Negative.   Cardiovascular:       History of 3 stents .  History of carotid stenosis .  History of bilateral carotid stenosis.  History of hypertension.  Gastrointestinal: Negative.   Genitourinary: Negative.   Musculoskeletal:       Rheumatoid arthritis.  Aseptic necrosis of one bone.  Renal artery stenosis that needed a stent  Skin:       Eczema began when she was 74 years of age.  Eczema has become worse during the past year and she gets irritation of her skin with many lotions.  Neurological:       Mild cognitive impairment on Aricept  Endo/Heme/Allergies:   No diabetes or thyroid disease  Psychiatric/Behavioral: Negative.     Outpatient Encounter Medications as of 10/23/2019  Medication Sig  . ascorbic acid (VITAMIN C) 100 MG tablet Take by mouth.  . busPIRone (BUSPAR) 15 MG tablet Take 15 mg by mouth 3 (three) times daily.   . cetirizine (ZYRTEC) 5 MG tablet Take 1 tablet (5 mg total) by mouth daily.  . clopidogrel (PLAVIX) 75 MG tablet Take 75 mg by mouth daily.  Marland Kitchen donepezil (ARICEPT) 10 MG tablet Take 1 tablet (10 mg total) by mouth at bedtime.  Marland Kitchen guaiFENesin (MUCINEX) 600 MG 12 hr tablet Take 1 tablet (600 mg total) by mouth 2 (two) times daily as needed for cough.  Marland Kitchen ibuprofen (ADVIL) 800 MG tablet Take by mouth.  . linaclotide (LINZESS) 145 MCG CAPS capsule Take 145 mcg by mouth once a week.   . meclizine (ANTIVERT) 12.5 MG tablet Take 1 tablet (12.5 mg total) by mouth 3 (three) times daily as needed for dizziness.  Marland Kitchen NITROSTAT 0.4 MG SL tablet Place 0.4 mg under the tongue every 5 (five) minutes as needed for chest pain.   . Triamcinolone Acetonide (NASACORT AQ NA) Place into the nose.  Marland Kitchen MYRBETRIQ 50 MG TB24 tablet Take 50 mg by mouth daily.  . [DISCONTINUED] carvedilol (COREG) 3.125  MG tablet Take 3.125 mg by mouth 2 (two) times daily.  . [DISCONTINUED] clobetasol cream (TEMOVATE) 0.05 % APPLY CREAM TOPICALLY TO AFFECTED AREA TWICE DAILY  . [DISCONTINUED] dicyclomine (BENTYL) 10 MG capsule Take 10 mg by mouth 4 (four) times daily as needed.  . [DISCONTINUED] ferrous sulfate 325 (65 FE) MG tablet Take by mouth.  . [DISCONTINUED] gabapentin (NEURONTIN) 800 MG tablet TAKE 1 TABLET BY MOUTH THREE TIMES DAILY (Patient not taking: Reported on 09/28/2019)  . [DISCONTINUED] halobetasol (ULTRAVATE) 0.05 % cream APPLY CREAM TOPICALLY TWICE DAILY  . [DISCONTINUED] memantine (NAMENDA) 5 MG tablet Take 5 mg by mouth 2 (two) times daily.  . [DISCONTINUED] pravastatin (PRAVACHOL) 40 MG tablet Take 40 mg by mouth daily.  . [DISCONTINUED] predniSONE  (STERAPRED UNI-PAK 48 TAB) 10 MG (48) TBPK tablet   . [DISCONTINUED] tamsulosin (FLOMAX) 0.4 MG CAPS capsule Take 1 capsule (0.4 mg total) by mouth daily. (Patient not taking: Reported on 10/23/2019)   No facility-administered encounter medications on file as of 10/23/2019.     Drug Allergies:  Allergies  Allergen Reactions  . Other Other (See Comments)    Prefers paper tape, allergic to tree pollen  . Lamotrigine Rash  . Latex Other (See Comments) and Rash    unknown   . Nickel Rash    Family History: Sara Villanueva's family history includes Allergic rhinitis in her sister; Heart disease in her father and mother..  Family history is positive for eczema in one niece .  Family history is negative for asthma, angioedema, hives, food allergies, lupus, chronic bronchitis or emphysema.  Social and environmental.  There are no pets in her home.  She is not exposed to cigarette smoking.  She quit smoking cigarettes 35 years ago.  She is retired  Physical Exam: BP 130/80   Pulse (!) 110   Temp 97.9 F (36.6 C) (Oral)   Resp 18   Ht 5\' 8"  (1.727 m)   Wt 134 lb (60.8 kg)   SpO2 99%   BMI 20.37 kg/m    Physical Exam Vitals reviewed.  Constitutional:      Appearance: Normal appearance. She is normal weight.  HENT:     Head:     Comments: Eyes normal.  Ears normal.  Nose mild swelling of the nasal turbinates with a clear nasal discharge.  Pharynx normal. Neck:     Comments: No thyromegaly Cardiovascular:     Rate and Rhythm: Normal rate and regular rhythm.     Comments: S1-S2 normal.  She had a grade 2/6 systolic ejection murmur best heard in the aortic valve area Pulmonary:     Comments: Clear to percussion and auscultation Abdominal:     Palpations: Abdomen is soft.  Musculoskeletal:     Cervical back: Neck supple.  Lymphadenopathy:     Cervical: No cervical adenopathy.  Skin:    Comments: Dry skin with a few hyperpigmented areas where she has had eczema  Neurological:      General: No focal deficit present.     Mental Status: She is alert and oriented to person, place, and time. Mental status is at baseline.  Psychiatric:        Mood and Affect: Mood normal.        Behavior: Behavior normal.        Thought Content: Thought content normal.        Judgment: Judgment normal.     Diagnostics: Allergy skin test were positive to tree pollens, dust mite and cockroach  Assessment  Assessment and Plan: 1. Seasonal allergic rhinitis due to pollen   2. Seasonal allergic conjunctivitis   3. Other atopic dermatitis   4. Rheumatoid arthritis, involving unspecified site, unspecified whether rheumatoid factor present ([place])   5. Essential (primary) hypertension   6. S/P carotid endarterectomy   7. History of congestive heart failure   8. Memory loss     No orders of the defined types were placed in this encounter.   Patient Instructions  Environmental control of dust and mite  Claritin 10 mg-take 1 tablet once a day if needed for runny nose or itchy eyes.  You may stop when you feel well in the summer..  You may buy the store brand called Loratadine instead of Zyrtec which would make you sleepy Nasacort 2 sprays per nostril once a day if needed for stuffy nose Visine AC-1 drop in each eye 3 times a day if needed for itchy eyes.  You may buy the store brand Call us if you are not doing well on this treatment plan  Continue on your other medications.  See your dermatologist this afternoon as previously scheduled   Return in about 4 weeks (around 11/20/2019).   Thank you for the opportunity to care for this patient.  Please do not hesitate to contact me with questions.  Penne Lash, M.D.  [facility] [address] [place], [place] [address] [phone]

## 2019-11-04 ENCOUNTER — Encounter (HOSPITAL_BASED_OUTPATIENT_CLINIC_OR_DEPARTMENT_OTHER): Payer: Self-pay | Admitting: Emergency Medicine

## 2019-11-04 ENCOUNTER — Emergency Department (HOSPITAL_BASED_OUTPATIENT_CLINIC_OR_DEPARTMENT_OTHER)
Admission: EM | Admit: 2019-11-04 | Discharge: 2019-11-04 | Disposition: A | Discharge status: Home / Self Care | Payer: Medicare Other | Attending: Emergency Medicine | Admitting: Emergency Medicine

## 2019-11-04 ENCOUNTER — Other Ambulatory Visit: Payer: Self-pay

## 2019-11-04 DIAGNOSIS — Z91040 Latex allergy status: Secondary | ICD-10-CM | POA: Insufficient documentation

## 2019-11-04 DIAGNOSIS — I2510 Atherosclerotic heart disease of native coronary artery without angina pectoris: Secondary | ICD-10-CM | POA: Diagnosis not present

## 2019-11-04 DIAGNOSIS — Z87891 Personal history of nicotine dependence: Secondary | ICD-10-CM | POA: Diagnosis not present

## 2019-11-04 DIAGNOSIS — Z7902 Long term (current) use of antithrombotics/antiplatelets: Secondary | ICD-10-CM | POA: Diagnosis not present

## 2019-11-04 DIAGNOSIS — Z955 Presence of coronary angioplasty implant and graft: Secondary | ICD-10-CM | POA: Insufficient documentation

## 2019-11-04 DIAGNOSIS — I509 Heart failure, unspecified: Secondary | ICD-10-CM | POA: Insufficient documentation

## 2019-11-04 DIAGNOSIS — I110 Hypertensive heart disease with heart failure: Secondary | ICD-10-CM | POA: Insufficient documentation

## 2019-11-04 DIAGNOSIS — R42 Dizziness and giddiness: Secondary | ICD-10-CM | POA: Diagnosis not present

## 2019-11-04 LAB — BASIC METABOLIC PANEL
Anion gap: 9 (ref 5–15)
BUN: 12 mg/dL (ref 8–23)
CO2: 23 mmol/L (ref 22–32)
Calcium: 9.1 mg/dL (ref 8.9–10.3)
Chloride: 104 mmol/L (ref 98–111)
Creatinine, Ser: 0.61 mg/dL (ref 0.44–1.00)
GFR calc Af Amer: >60 mL/min (ref >60)
GFR calc non Af Amer: >60 mL/min (ref >60)
Glucose, Bld: 103 mg/dL — ABNORMAL HIGH (ref 70–99)
Potassium: 3.4 mmol/L — ABNORMAL LOW (ref 3.5–5.1)
Sodium: 136 mmol/L (ref 135–145)

## 2019-11-04 LAB — URINALYSIS, ROUTINE W REFLEX MICROSCOPIC
Bilirubin Urine: NEGATIVE
Glucose, UA: NEGATIVE mg/dL
Hgb urine dipstick: NEGATIVE
Ketones, ur: NEGATIVE mg/dL
Leukocytes,Ua: NEGATIVE
Nitrite: NEGATIVE
Protein, ur: NEGATIVE mg/dL
Specific Gravity, Urine: <1.005 — ABNORMAL LOW (ref 1.005–1.030)
pH: 6 (ref 5.0–8.0)

## 2019-11-04 LAB — CBC
HCT: 39.3 % (ref 36.0–46.0)
Hemoglobin: 12.5 g/dL (ref 12.0–15.0)
MCH: 31.4 pg (ref 26.0–34.0)
MCHC: 31.8 g/dL (ref 30.0–36.0)
MCV: 98.7 fL (ref 80.0–100.0)
Platelets: 257 10*3/uL (ref 150–400)
RBC: 3.98 MIL/uL (ref 3.87–5.11)
RDW: 12.8 % (ref 11.5–15.5)
WBC: 4.7 10*3/uL (ref 4.0–10.5)
nRBC: 0 % (ref 0.0–0.2)

## 2019-11-04 MED ORDER — SODIUM CHLORIDE 0.9% FLUSH
3.0000 mL | Freq: Once | INTRAVENOUS | Status: DC
  Filled 2019-11-04: qty 3

## 2019-11-04 NOTE — ED Triage Notes (Signed)
Dizziness x 1 week. States "I can't hardly walk". Reports that her hemoglobin may be low.

## 2019-11-04 NOTE — ED Provider Notes (Signed)
[facility] EMERGENCY DEPARTMENT Provider Note   CSN: [id number] Arrival date & time: 11/04/19  1239     History Chief Complaint  Patient presents with  . Dizziness    Sara Villanueva is a 74 y.o. female.  Patient is a 74 year old female with a history of coronary artery disease, CHF, hypertension, hyperlipidemia and rheumatoid arthritis who presents with lightheadedness.  She says for the last week she has been feeling dizzy.  She says she mostly feels lightheaded and a little bit weak when she is walking around.  She denies any vertiginous type symptoms.  She says that she has had vertigo in the past recently and this does not feel like vertigo.  She does not have any spinning sensation.  The dizziness is not worse when she moves her head from side to side.  She denies any chest pain or palpitations.  No associated shortness of breath.  No syncopal episodes.  No numbness or weakness to her extremities other than some generalized fatigue.  She denies any fevers, cough or cold symptoms.  No recent nausea or vomiting.  No UTI symptoms.  She was seen by her PCP who ordered some blood work.  She was told that she was anemic but she did not find out how low her hemoglobin was.  She feels like this may be the reason that she is feeling lightheaded.        Past Medical History:  Diagnosis Date  . Arthritis   . Bladder disorder   . CAD (coronary artery disease)    stents x 3, 2013, 2017  . Carotid arterial disease (HCC)   . CHF (congestive heart failure) (HCC)   . Colon polyps   . Eczema   . High cholesterol   . Hypertension   . Lower GI bleed   . Rheumatoid arteritis (HCC)   . Rheumatoid arthritis [facility])     Patient Active Problem List   Diagnosis Date Noted  . Chronic systolic congestive heart failure (HCC) 05/12/2019  . Memory loss 03/21/2019  . Near syncope 02/25/2019  . UTI due to extended-spectrum beta lactamase (ESBL) producing Escherichia coli 02/25/2019  .  Urinary retention with incomplete bladder emptying 02/25/2019  . Voiding dysfunction 02/14/2019  . Venous insufficiency (chronic) (peripheral) 12/08/2018  . 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 Mild cognitive impairment with memory loss 09/21/2018  . Acute cystitis without hematuria 09/09/2018  . Heme positive stool 03/28/2018  . S/P carotid endarterectomy 12/27/2017  . Chronic constipation 10/18/2017  . History of colon polyps 10/18/2017  . 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 Acute recurrent sinusitis 03/02/2017  . Anemia, unspecified 01/31/2016  . Knee pain, left 08/05/2015  . Allergic rhinitis 07/15/2015  . Stenosis of carotid artery 07/15/2015  . PVD (peripheral vascular disease) (HCC) 07/15/2015  . Renal artery stenosis (HCC) 07/15/2015  . Bilateral carotid artery stenosis 07/15/2015  . 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 Essential (primary) hypertension 06/13/2015  . HLD (hyperlipidemia) 06/13/2015  . Urgency of micturation 05/03/2015  . Urination frequency 05/03/2015  . Pedal edema 12/04/2014  . Dysesthesia 12/04/2014  . Nuclear sclerotic cataract 09/20/2014  . Lumbar radiculopathy 08/16/2014  . Spondylolisthesis at L5-S1 level 08/07/2014  . Neck pain 08/07/2014  . Chronic rheumatic arthritis (HCC) 08/07/2014  . Rheumatoid arthritis (HCC) 08/07/2014  . Angina pectoris (HCC) 08/03/2014  . Aseptic bony necrosis (HCC) 08/03/2014  . Aseptic necrosis of bone (HCC) 08/03/2014  Past Surgical History:  Procedure Laterality Date  . CAROTID ENDARTERECTOMY    . CORONARY ANGIOPLASTY WITH STENT PLACEMENT       OB History   No obstetric history on file.     Family History  Problem Relation Age of Onset  . Heart disease Mother   . Heart disease Father   . Allergic rhinitis Sister   . Asthma Neg Hx   . Eczema Neg Hx   . Urticaria Neg Hx   . Immunodeficiency Neg Hx   . Angioedema Neg Hx     Social History    Tobacco Use  . Smoking status: Former Smoker    Packs/day: 0.50    Years: 25.00    Pack years: 12.50    Types: Cigarettes    Quit date: 2000    Years since quitting: 21.4  . Smokeless tobacco: Never Used  Substance Use Topics  . Alcohol use: No    Alcohol/week: 0.0 standard drinks  . Drug use: No    Home Medications Prior to Admission medications   Medication Sig Start Date End Date Taking? Authorizing Provider  ascorbic acid (VITAMIN C) 100 MG tablet Take by mouth.    [provider]  busPIRone (BUSPAR) 15 MG tablet Take 15 mg by mouth 3 (three) times daily.  08/01/14   [provider]  cetirizine (ZYRTEC) 5 MG tablet Take 1 tablet (5 mg total) by mouth daily. 09/30/19   Fawze, Mina A, PA-C  clopidogrel (PLAVIX) 75 MG tablet Take 75 mg by mouth daily.    [provider]  donepezil (ARICEPT) 10 MG tablet Take 1 tablet (10 mg total) by mouth at bedtime. 09/20/19   Sater, Pearletha Furl, MD  guaiFENesin (MUCINEX) 600 MG 12 hr tablet Take 1 tablet (600 mg total) by mouth 2 (two) times daily as needed for cough. 09/30/19   Fawze, Mina A, PA-C  ibuprofen (ADVIL) 800 MG tablet Take by mouth. 07/22/19   [provider]  linaclotide (LINZESS) 145 MCG CAPS capsule Take 145 mcg by mouth once a week.  09/07/19   [provider]  meclizine (ANTIVERT) 12.5 MG tablet Take 1 tablet (12.5 mg total) by mouth 3 (three) times daily as needed for dizziness. 09/11/19   Linwood Dibbles, MD  MYRBETRIQ 50 MG TB24 tablet Take 50 mg by mouth daily. 08/10/19   [provider]  NITROSTAT 0.4 MG SL tablet Place 0.4 mg under the tongue every 5 (five) minutes as needed for chest pain.  07/08/14   [provider]  Triamcinolone Acetonide (NASACORT AQ NA) Place into the nose.    [provider]    Allergies    Other, Lamotrigine, Latex, and Nickel  Review of Systems   Review of Systems  Constitutional: Positive for fatigue. Negative for chills, diaphoresis and  fever.  HENT: Negative for congestion, rhinorrhea and sneezing.   Eyes: Negative.   Respiratory: Negative for cough, chest tightness and shortness of breath.   Cardiovascular: Negative for chest pain and leg swelling.  Gastrointestinal: Negative for abdominal pain, blood in stool, diarrhea, nausea and vomiting.  Genitourinary: Negative for difficulty urinating, flank pain, frequency and hematuria.  Musculoskeletal: Negative for arthralgias and back pain.  Skin: Negative for rash.  Neurological: Positive for light-headedness. Negative for dizziness, speech difficulty, weakness, numbness and headaches.    Physical Exam Updated Vital Signs BP (!) 157/101 (BP Location: Right Arm)   Pulse 90   Temp 98.1 F (36.7 C) (Oral)   Resp  19   Ht 5\' 8"  (1.727 m)   Wt 63.5 kg   SpO2 100%   BMI 21.29 kg/m   Physical Exam Constitutional:      Appearance: She is well-developed.  HENT:     Head: Normocephalic and atraumatic.  Eyes:     Pupils: Pupils are equal, round, and reactive to light.     Comments: No nystagmus  Neck:     Comments: No meningismus Cardiovascular:     Rate and Rhythm: Normal rate and regular rhythm.     Heart sounds: Normal heart sounds.  Pulmonary:     Effort: Pulmonary effort is normal. No respiratory distress.     Breath sounds: Normal breath sounds. No wheezing or rales.  Chest:     Chest wall: No tenderness.  Abdominal:     General: Bowel sounds are normal.     Palpations: Abdomen is soft.     Tenderness: There is no abdominal tenderness. There is no guarding or rebound.  Musculoskeletal:        General: Normal range of motion.     Cervical back: Normal range of motion and neck supple.  Lymphadenopathy:     Cervical: No cervical adenopathy.  Skin:    General: Skin is warm and dry.     Findings: No rash.  Neurological:     Mental Status: She is alert and oriented to person, place, and time.     Comments: Motor 5/5 all extremities Sensation grossly intact  to LT all extremities Finger to Nose intact, no pronator drift CN II-XII grossly intact Gait normal      ED Results / Procedures / Treatments   Labs (all labs ordered are listed, but only abnormal results are displayed) Labs Reviewed  BASIC METABOLIC PANEL - Abnormal; Notable for the following components:      Result Value   Potassium 3.4 (*)    Glucose, Bld 103 (*)    All other components within normal limits  URINALYSIS, ROUTINE W REFLEX MICROSCOPIC - Abnormal; Notable for the following components:   Specific Gravity, Urine <1.005 (*)    All other components within normal limits  CBC    EKG EKG Interpretation  Date/Time:  Saturday November 04 2019 12:59:46 EDT Ventricular Rate:  90 PR Interval:  148 QRS Duration: 84 QT Interval:  376 QTC Calculation: 459 R Axis:   65 Text Interpretation: Normal sinus rhythm Right atrial enlargement Left ventricular hypertrophy with repolarization abnormality ( Sokolow-Lyon ) Abnormal ECG since last tracing no significant change Confirmed by Malvin Johns [phone]) on 11/04/2019 3:34:20 PM   Radiology No results found.  Procedures Procedures (including critical care time)  Medications Ordered in ED Medications  sodium chloride flush (NS) 0.9 % injection 3 mL (3 mLs Intravenous Not Given 11/04/19 1414)    ED Course  I have reviewed the triage vital signs and the nursing notes.  Pertinent labs & imaging results that were available during my care of the patient were reviewed by me and considered in my medical decision making (see chart for details).    MDM Rules/Calculators/A&P                      Patient is a 74 year old female who presents with lightheadedness.  She does not have any vertiginous type symptoms.  She had a mild headache earlier today but denies any currently.  Her labs are nonconcerning.  She does not have any anemia here.  Her hemoglobin is slightly  lower than her prior values but not enough for I would be concerned  that that would be causing her symptoms.  She says that she has been eating and drinking normally.  She is able to ambulate here without any ataxia or significant symptoms.  She does not have evidence of a UTI or other suggestions of infection.  When I went to reevaluate her prior to discharge, she was complaining of a headache again more toward the frontal region.  I discussed with her obtaining a CT scan giving her lightheadedness associated with the headache but at this point she does not want to get a CT scan.  I discussed the limitations of evaluation without the scan.  She does feel that now she has more allergy symptoms which typically causes her to have a headache like she is having today.  She says allergy symptoms have also caused her to be lightheaded which is the way she is feeling today as well.  She has allergy medicine at home to take.  She will follow-up with her doctor on Monday.  Return precautions were given. Final Clinical Impression(s) / ED Diagnoses Final diagnoses:  Lightheadedness    Rx / DC Orders ED Discharge Orders    None       Rolan Bucco, MD 11/04/19 2335

## 2020-02-12 ENCOUNTER — Other Ambulatory Visit (HOSPITAL_BASED_OUTPATIENT_CLINIC_OR_DEPARTMENT_OTHER): Payer: Self-pay | Admitting: Internal Medicine

## 2020-02-12 DIAGNOSIS — Z1231 Encounter for screening mammogram for malignant neoplasm of breast: Secondary | ICD-10-CM

## 2020-03-05 ENCOUNTER — Ambulatory Visit (HOSPITAL_BASED_OUTPATIENT_CLINIC_OR_DEPARTMENT_OTHER): Payer: Medicare Other

## 2020-03-11 ENCOUNTER — Other Ambulatory Visit: Payer: Self-pay

## 2020-03-11 ENCOUNTER — Emergency Department (HOSPITAL_BASED_OUTPATIENT_CLINIC_OR_DEPARTMENT_OTHER): Payer: Medicare Other

## 2020-03-11 ENCOUNTER — Emergency Department (HOSPITAL_BASED_OUTPATIENT_CLINIC_OR_DEPARTMENT_OTHER)
Admission: EM | Admit: 2020-03-11 | Discharge: 2020-03-11 | Disposition: A | Discharge status: Home / Self Care | Payer: Medicare Other | Attending: Emergency Medicine | Admitting: Emergency Medicine

## 2020-03-11 DIAGNOSIS — I110 Hypertensive heart disease with heart failure: Secondary | ICD-10-CM | POA: Diagnosis not present

## 2020-03-11 DIAGNOSIS — I5022 Chronic systolic (congestive) heart failure: Secondary | ICD-10-CM | POA: Insufficient documentation

## 2020-03-11 DIAGNOSIS — Z7902 Long term (current) use of antithrombotics/antiplatelets: Secondary | ICD-10-CM | POA: Diagnosis not present

## 2020-03-11 DIAGNOSIS — Z91040 Latex allergy status: Secondary | ICD-10-CM | POA: Insufficient documentation

## 2020-03-11 DIAGNOSIS — R519 Headache, unspecified: Secondary | ICD-10-CM | POA: Diagnosis present

## 2020-03-11 DIAGNOSIS — N3000 Acute cystitis without hematuria: Secondary | ICD-10-CM

## 2020-03-11 DIAGNOSIS — I10 Essential (primary) hypertension: Secondary | ICD-10-CM

## 2020-03-11 DIAGNOSIS — Z79899 Other long term (current) drug therapy: Secondary | ICD-10-CM | POA: Insufficient documentation

## 2020-03-11 DIAGNOSIS — Z87891 Personal history of nicotine dependence: Secondary | ICD-10-CM | POA: Insufficient documentation

## 2020-03-11 DIAGNOSIS — I2510 Atherosclerotic heart disease of native coronary artery without angina pectoris: Secondary | ICD-10-CM | POA: Diagnosis not present

## 2020-03-11 DIAGNOSIS — Z951 Presence of aortocoronary bypass graft: Secondary | ICD-10-CM | POA: Insufficient documentation

## 2020-03-11 LAB — CBC WITH DIFFERENTIAL/PLATELET
Abs Immature Granulocytes: 0.03 10*3/uL (ref 0.00–0.07)
Basophils Absolute: 0 10*3/uL (ref 0.0–0.1)
Basophils Relative: 0 %
Eosinophils Absolute: 0.1 10*3/uL (ref 0.0–0.5)
Eosinophils Relative: 1 %
HCT: 45.1 % (ref 36.0–46.0)
Hemoglobin: 14.1 g/dL (ref 12.0–15.0)
Immature Granulocytes: 0 %
Lymphocytes Relative: 28 %
Lymphs Abs: 2.1 10*3/uL (ref 0.7–4.0)
MCH: 29.1 pg (ref 26.0–34.0)
MCHC: 31.3 g/dL (ref 30.0–36.0)
MCV: 93.2 fL (ref 80.0–100.0)
Monocytes Absolute: 0.7 10*3/uL (ref 0.1–1.0)
Monocytes Relative: 9 %
Neutro Abs: 4.4 10*3/uL (ref 1.7–7.7)
Neutrophils Relative %: 62 %
Platelets: 205 10*3/uL (ref 150–400)
RBC: 4.84 MIL/uL (ref 3.87–5.11)
RDW: 16.1 % — ABNORMAL HIGH (ref 11.5–15.5)
WBC: 7.3 10*3/uL (ref 4.0–10.5)
nRBC: 0 % (ref 0.0–0.2)

## 2020-03-11 LAB — URINALYSIS, ROUTINE W REFLEX MICROSCOPIC
Bilirubin Urine: NEGATIVE
Glucose, UA: NEGATIVE mg/dL
Hgb urine dipstick: NEGATIVE
Ketones, ur: NEGATIVE mg/dL
Nitrite: NEGATIVE
Protein, ur: NEGATIVE mg/dL
Specific Gravity, Urine: <1.005 — ABNORMAL LOW (ref 1.005–1.030)
pH: 6 (ref 5.0–8.0)

## 2020-03-11 LAB — BASIC METABOLIC PANEL
Anion gap: 8 (ref 5–15)
BUN: 23 mg/dL (ref 8–23)
CO2: 24 mmol/L (ref 22–32)
Calcium: 9.3 mg/dL (ref 8.9–10.3)
Chloride: 108 mmol/L (ref 98–111)
Creatinine, Ser: 0.9 mg/dL (ref 0.44–1.00)
GFR, Estimated: >60 mL/min (ref >60)
Glucose, Bld: 70 mg/dL (ref 70–99)
Potassium: 3.4 mmol/L — ABNORMAL LOW (ref 3.5–5.1)
Sodium: 140 mmol/L (ref 135–145)

## 2020-03-11 LAB — URINALYSIS, MICROSCOPIC (REFLEX)

## 2020-03-11 MED ORDER — CEPHALEXIN 500 MG PO CAPS
500.0000 mg | ORAL_CAPSULE | Freq: Two times a day (BID) | ORAL | 0 refills | Status: DC
Start: 2020-03-11 — End: 2021-01-01

## 2020-03-11 NOTE — ED Notes (Signed)
Patient denies pain and is resting comfortably.  

## 2020-03-11 NOTE — ED Notes (Signed)
Pt in CT, unable to complete EKG at this time.

## 2020-03-11 NOTE — Discharge Instructions (Addendum)
Follow-up with your cardiologist to have your blood pressure rechecked.  Take the antibiotics as directed.  Return here as needed if you have any worsening symptoms.

## 2020-03-11 NOTE — ED Provider Notes (Signed)
[facility] EMERGENCY DEPARTMENT Provider Note   CSN: [id number] Arrival date & time: 03/11/20  1016     History No chief complaint on file.   Sara Villanueva is a 74 y.o. female.  patient is a 74 year old female with a history of coronary artery disease status post stent placement, CHF, hypertension, hyperlipidemia, and rheumatoid arthritis who presents with elevated blood pressure.  She is noted that her blood pressures been elevated over the last couple of weeks.  She also wakes up every morning with a headache.  She says it goes away on its own throughout the morning.  She has had 1 however every morning on waking for the last 3 weeks.  No current headache.  No nausea or vomiting.  She does get a little dizzy from time to time when she is walking.  She recently talked to her cardiologist who manages her blood pressure regarding her increased blood pressures and her lisinopril has been increased to 10 mg a day from 5 mg/day.  She is also on metoprolol which is at her current dose.  She denies any chest pain or shortness of breath.  No other recent illnesses.        Past Medical History:  Diagnosis Date  . Arthritis   . Bladder disorder   . CAD (coronary artery disease)    stents x 3, 2013, 2017  . Carotid arterial disease (HCC)   . CHF (congestive heart failure) (HCC)   . Colon polyps   . Eczema   . High cholesterol   . Hypertension   . Lower GI bleed   . Rheumatoid arteritis (HCC)   . Rheumatoid arthritis [facility])     Patient Active Problem List   Diagnosis Date Noted  . Chronic systolic congestive heart failure (HCC) 05/12/2019  . Memory loss 03/21/2019  . Near syncope 02/25/2019  . UTI due to extended-spectrum beta lactamase (ESBL) producing Escherichia coli 02/25/2019  . Urinary retention with incomplete bladder emptying 02/25/2019  . Voiding dysfunction 02/14/2019  . Venous insufficiency (chronic) (peripheral) 12/08/2018  . Varicose veins of left lower  extremity with edema 10/27/2018  . Mild cognitive impairment with memory loss 09/21/2018  . Acute cystitis without hematuria 09/09/2018  . Heme positive stool 03/28/2018  . S/P carotid endarterectomy 12/27/2017  . Chronic constipation 10/18/2017  . History of colon polyps 10/18/2017  . 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 Acute recurrent sinusitis 03/02/2017  . Anemia, unspecified 01/31/2016  . Knee pain, left 08/05/2015  . Allergic rhinitis 07/15/2015  . Stenosis of carotid artery 07/15/2015  . PVD (peripheral vascular disease) (HCC) 07/15/2015  . Renal artery stenosis (HCC) 07/15/2015  . Bilateral carotid artery stenosis 07/15/2015  . 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 Essential (primary) hypertension 06/13/2015  . HLD (hyperlipidemia) 06/13/2015  . Urgency of micturation 05/03/2015  . Urination frequency 05/03/2015  . Pedal edema 12/04/2014  . Dysesthesia 12/04/2014  . Nuclear sclerotic cataract 09/20/2014  . Lumbar radiculopathy 08/16/2014  . Spondylolisthesis at L5-S1 level 08/07/2014  . Neck pain 08/07/2014  . Chronic rheumatic arthritis (HCC) 08/07/2014  . Rheumatoid arthritis (HCC) 08/07/2014  . Angina pectoris (HCC) 08/03/2014  . Aseptic bony necrosis (HCC) 08/03/2014  . Aseptic necrosis of bone (HCC) 08/03/2014    Past Surgical History:  Procedure Laterality Date  . CAROTID ENDARTERECTOMY    . CORONARY ANGIOPLASTY WITH STENT PLACEMENT       OB History   No obstetric history on file.  Family History  Problem Relation Age of Onset  . Heart disease Mother   . Heart disease Father   . Allergic rhinitis Sister   . Asthma Neg Hx   . Eczema Neg Hx   . Urticaria Neg Hx   . Immunodeficiency Neg Hx   . Angioedema Neg Hx     Social History   Tobacco Use  . Smoking status: Former Smoker    Packs/day: 0.50    Years: 25.00    Pack years: 12.50    Types: Cigarettes    Quit date: 2000    Years since quitting: 21.7  . Smokeless  tobacco: Never Used  Vaping Use  . Vaping Use: Never used  Substance Use Topics  . Alcohol use: No    Alcohol/week: 0.0 standard drinks  . Drug use: No    Home Medications Prior to Admission medications   Medication Sig Start Date End Date Taking? Authorizing Provider  ascorbic acid (VITAMIN C) 100 MG tablet Take by mouth.    [provider]  busPIRone (BUSPAR) 15 MG tablet Take 15 mg by mouth 3 (three) times daily.  08/01/14   [provider]  cephALEXin (KEFLEX) 500 MG capsule Take 1 capsule (500 mg total) by mouth 2 (two) times daily. 03/11/20   Rolan Bucco, MD  cetirizine (ZYRTEC) 5 MG tablet Take 1 tablet (5 mg total) by mouth daily. 09/30/19   Fawze, Mina A, PA-C  clopidogrel (PLAVIX) 75 MG tablet Take 75 mg by mouth daily.    [provider]  donepezil (ARICEPT) 10 MG tablet Take 1 tablet (10 mg total) by mouth at bedtime. 09/20/19   Sater, Pearletha Furl, MD  guaiFENesin (MUCINEX) 600 MG 12 hr tablet Take 1 tablet (600 mg total) by mouth 2 (two) times daily as needed for cough. 09/30/19   Fawze, Mina A, PA-C  ibuprofen (ADVIL) 800 MG tablet Take by mouth. 07/22/19   [provider]  linaclotide (LINZESS) 145 MCG CAPS capsule Take 145 mcg by mouth once a week.  09/07/19   [provider]  meclizine (ANTIVERT) 12.5 MG tablet Take 1 tablet (12.5 mg total) by mouth 3 (three) times daily as needed for dizziness. 09/11/19   Linwood Dibbles, MD  MYRBETRIQ 50 MG TB24 tablet Take 50 mg by mouth daily. 08/10/19   [provider]  NITROSTAT 0.4 MG SL tablet Place 0.4 mg under the tongue every 5 (five) minutes as needed for chest pain.  07/08/14   [provider]  Triamcinolone Acetonide (NASACORT AQ NA) Place into the nose.    [provider]    Allergies    Other, Lamotrigine, Latex, and Nickel  Review of Systems   Review of Systems  Constitutional: Negative for chills, diaphoresis, fatigue and fever.  HENT: Negative for congestion,  rhinorrhea and sneezing.   Eyes: Negative.   Respiratory: Negative for cough, chest tightness and shortness of breath.   Cardiovascular: Negative for chest pain and leg swelling.  Gastrointestinal: Negative for abdominal pain, blood in stool, diarrhea, nausea and vomiting.  Genitourinary: Negative for difficulty urinating, flank pain, frequency and hematuria.  Musculoskeletal: Negative for arthralgias and back pain.  Skin: Negative for rash.  Neurological: Positive for dizziness and headaches. Negative for speech difficulty, weakness and numbness.    Physical Exam Updated Vital Signs BP (!) 166/103 Comment: Left arm  Pulse 68   Temp 98.5 F (36.9 C) (Oral)   Resp 16   Ht 5\' 8"  (1.727 m)  Wt 65.8 kg   SpO2 100%   BMI 22.05 kg/m   Physical Exam Constitutional:      Appearance: She is well-developed.  HENT:     Head: Normocephalic and atraumatic.  Eyes:     Pupils: Pupils are equal, round, and reactive to light.  Cardiovascular:     Rate and Rhythm: Normal rate and regular rhythm.     Heart sounds: Normal heart sounds.  Pulmonary:     Effort: Pulmonary effort is normal. No respiratory distress.     Breath sounds: Normal breath sounds. No wheezing or rales.  Chest:     Chest wall: No tenderness.  Abdominal:     General: Bowel sounds are normal.     Palpations: Abdomen is soft.     Tenderness: There is no abdominal tenderness. There is no guarding or rebound.  Musculoskeletal:        General: Normal range of motion.     Cervical back: Normal range of motion and neck supple.  Lymphadenopathy:     Cervical: No cervical adenopathy.  Skin:    General: Skin is warm and dry.     Findings: No rash.  Neurological:     General: No focal deficit present.     Mental Status: She is alert and oriented to person, place, and time.     Comments: Motor 5/5 all extremities Sensation grossly intact to LT all extremities Finger to Nose intact, no pronator drift CN II-XII grossly  intact Gait normal      ED Results / Procedures / Treatments   Labs (all labs ordered are listed, but only abnormal results are displayed) Labs Reviewed  URINALYSIS, ROUTINE W REFLEX MICROSCOPIC - Abnormal; Notable for the following components:      Result Value   Specific Gravity, Urine <1.005 (*)    Leukocytes,Ua MODERATE (*)    All other components within normal limits  BASIC METABOLIC PANEL - Abnormal; Notable for the following components:   Potassium 3.4 (*)    All other components within normal limits  CBC WITH DIFFERENTIAL/PLATELET - Abnormal; Notable for the following components:   RDW 16.1 (*)    All other components within normal limits  URINALYSIS, MICROSCOPIC (REFLEX) - Abnormal; Notable for the following components:   Bacteria, UA FEW (*)    All other components within normal limits    EKG EKG Interpretation  Date/Time:  Monday March 11 2020 12:14:36 EDT Ventricular Rate:  67 PR Interval:    QRS Duration: 99 QT Interval:  428 QTC Calculation: 452 R Axis:   72 Text Interpretation: Sinus rhythm RAE, consider biatrial enlargement LVH with secondary repolarization abnormality Baseline wander in lead(s) III V4 since last tracing no significant change Confirmed by Rolan Bucco [phone]) on 03/11/2020 1:09:11 PM   Radiology CT Head Wo Contrast  Result Date: 03/11/2020 CLINICAL DATA:  Dizziness and headache.  Elevated blood pressure. EXAM: CT HEAD WITHOUT CONTRAST TECHNIQUE: Contiguous axial images were obtained from the base of the skull through the vertex without intravenous contrast. COMPARISON:  Oct 09, 2019 FINDINGS: Brain: No evidence of acute infarction, hemorrhage, hydrocephalus, extra-axial collection or mass lesion/mass effect. Signs of atrophy as before. Basal ganglia calcifications without change. Vascular: No hyperdense vessel or unexpected calcification. Skull: Normal. Negative for fracture or focal lesion. Sinuses/Orbits: Limited imaging of sinuses and  orbits is unremarkable. Other: None. IMPRESSION: 1. No acute intracranial pathology. 2. Signs of atrophy as before. Electronically Signed   By: Jason Fila.D.  On: 03/11/2020 11:59    Procedures Procedures (including critical care time)  Medications Ordered in ED Medications - No data to display  ED Course  I have reviewed the triage vital signs and the nursing notes.  Pertinent labs & imaging results that were available during my care of the patient were reviewed by me and considered in my medical decision making (see chart for details).    MDM Rules/Calculators/A&P                          Patient is a 74 year old female who presents with elevated blood pressure and headache.  She says her headache usually resolves once her blood pressure improves.  This is been going on for about 2 weeks.  She is currently asymptomatic although she does say at times she has some dizziness.  She has had intermittent dizziness for a long time and has been evaluated for this before.  She does not have any focal neurologic deficits.  No symptoms that sound more concerning for ACS.  No arrhythmias on EKG.  She had a head CT which shows no acute abnormality.  Her blood pressure is mildly elevated but she just recently had her blood pressure medication increased a few days ago.  Her urine does show concerns for infection.  She was started on Keflex.  She was discharged home in good condition.  She was encouraged to follow-up with her cardiologist to recheck her blood pressure.  Return precautions were given.  Final Clinical Impression(s) / ED Diagnoses Final diagnoses:  Hypertension, unspecified type  Nonintractable episodic headache, unspecified headache type  Acute cystitis without hematuria    Rx / DC Orders ED Discharge Orders         Ordered    cephALEXin (KEFLEX) 500 MG capsule  2 times daily        03/11/20 1307           Rolan Bucco, MD 03/11/20 1312

## 2020-03-11 NOTE — ED Notes (Signed)
Patient transported to CT 

## 2020-03-11 NOTE — ED Triage Notes (Signed)
Pt complaint of elevated BP for 3 weeks, pt states BP 191/92 today.

## 2020-03-19 ENCOUNTER — Other Ambulatory Visit: Payer: Self-pay

## 2020-03-19 ENCOUNTER — Encounter (HOSPITAL_BASED_OUTPATIENT_CLINIC_OR_DEPARTMENT_OTHER): Payer: Self-pay

## 2020-03-19 ENCOUNTER — Ambulatory Visit (HOSPITAL_BASED_OUTPATIENT_CLINIC_OR_DEPARTMENT_OTHER)
Admission: RE | Admit: 2020-03-19 | Discharge: 2020-03-19 | Disposition: A | Discharge status: Home / Self Care | Payer: Medicare Other | Source: Ambulatory Visit | Attending: Internal Medicine | Admitting: Internal Medicine

## 2020-03-19 DIAGNOSIS — Z1231 Encounter for screening mammogram for malignant neoplasm of breast: Secondary | ICD-10-CM | POA: Diagnosis not present

## 2020-07-01 ENCOUNTER — Encounter (HOSPITAL_BASED_OUTPATIENT_CLINIC_OR_DEPARTMENT_OTHER): Payer: Self-pay

## 2020-07-01 ENCOUNTER — Other Ambulatory Visit: Payer: Self-pay

## 2020-07-01 ENCOUNTER — Emergency Department (HOSPITAL_BASED_OUTPATIENT_CLINIC_OR_DEPARTMENT_OTHER)
Admission: EM | Admit: 2020-07-01 | Discharge: 2020-07-01 | Disposition: A | Discharge status: Home / Self Care | Payer: Medicare Other | Attending: Emergency Medicine | Admitting: Emergency Medicine

## 2020-07-01 DIAGNOSIS — Z5321 Procedure and treatment not carried out due to patient leaving prior to being seen by health care provider: Secondary | ICD-10-CM | POA: Insufficient documentation

## 2020-07-01 DIAGNOSIS — R109 Unspecified abdominal pain: Secondary | ICD-10-CM | POA: Insufficient documentation

## 2020-07-01 DIAGNOSIS — R110 Nausea: Secondary | ICD-10-CM | POA: Insufficient documentation

## 2020-07-01 DIAGNOSIS — R197 Diarrhea, unspecified: Secondary | ICD-10-CM | POA: Insufficient documentation

## 2020-07-01 LAB — COMPREHENSIVE METABOLIC PANEL
ALT: 26 U/L (ref 0–44)
AST: 26 U/L (ref 15–41)
Albumin: 3.9 g/dL (ref 3.5–5.0)
Alkaline Phosphatase: 61 U/L (ref 38–126)
Anion gap: 9 (ref 5–15)
BUN: 18 mg/dL (ref 8–23)
CO2: 23 mmol/L (ref 22–32)
Calcium: 9.6 mg/dL (ref 8.9–10.3)
Chloride: 106 mmol/L (ref 98–111)
Creatinine, Ser: 0.96 mg/dL (ref 0.44–1.00)
GFR, Estimated: >60 mL/min (ref >60)
Glucose, Bld: 92 mg/dL (ref 70–99)
Potassium: 3.8 mmol/L (ref 3.5–5.1)
Sodium: 138 mmol/L (ref 135–145)
Total Bilirubin: 0.4 mg/dL (ref 0.3–1.2)
Total Protein: 7.3 g/dL (ref 6.5–8.1)

## 2020-07-01 LAB — CBC
HCT: 42 % (ref 36.0–46.0)
Hemoglobin: 13.1 g/dL (ref 12.0–15.0)
MCH: 30 pg (ref 26.0–34.0)
MCHC: 31.2 g/dL (ref 30.0–36.0)
MCV: 96.3 fL (ref 80.0–100.0)
Platelets: 183 10*3/uL (ref 150–400)
RBC: 4.36 MIL/uL (ref 3.87–5.11)
RDW: 12.9 % (ref 11.5–15.5)
WBC: 5 10*3/uL (ref 4.0–10.5)
nRBC: 0 % (ref 0.0–0.2)

## 2020-07-01 LAB — LIPASE, BLOOD: Lipase: 32 U/L (ref 11–51)

## 2020-07-01 NOTE — ED Triage Notes (Addendum)
Pt c/o mid abd pain started ~10am-states she has "a stent in my stomach from a blockage last year"-pt later stated stent was for blockage in "intestines"-states pain feels same-NAD-steady gait

## 2020-08-04 ENCOUNTER — Emergency Department (HOSPITAL_BASED_OUTPATIENT_CLINIC_OR_DEPARTMENT_OTHER): Payer: Medicare Other

## 2020-08-04 ENCOUNTER — Other Ambulatory Visit: Payer: Self-pay

## 2020-08-04 ENCOUNTER — Emergency Department (HOSPITAL_BASED_OUTPATIENT_CLINIC_OR_DEPARTMENT_OTHER)
Admission: EM | Admit: 2020-08-04 | Discharge: 2020-08-04 | Disposition: A | Discharge status: Home / Self Care | Payer: Medicare Other | Attending: Emergency Medicine | Admitting: Emergency Medicine

## 2020-08-04 ENCOUNTER — Encounter (HOSPITAL_BASED_OUTPATIENT_CLINIC_OR_DEPARTMENT_OTHER): Payer: Self-pay

## 2020-08-04 DIAGNOSIS — I110 Hypertensive heart disease with heart failure: Secondary | ICD-10-CM | POA: Diagnosis not present

## 2020-08-04 DIAGNOSIS — K5909 Other constipation: Secondary | ICD-10-CM

## 2020-08-04 DIAGNOSIS — R1030 Lower abdominal pain, unspecified: Secondary | ICD-10-CM | POA: Diagnosis present

## 2020-08-04 DIAGNOSIS — I2510 Atherosclerotic heart disease of native coronary artery without angina pectoris: Secondary | ICD-10-CM | POA: Diagnosis not present

## 2020-08-04 DIAGNOSIS — I5022 Chronic systolic (congestive) heart failure: Secondary | ICD-10-CM | POA: Diagnosis not present

## 2020-08-04 DIAGNOSIS — Z91040 Latex allergy status: Secondary | ICD-10-CM | POA: Insufficient documentation

## 2020-08-04 DIAGNOSIS — Z7902 Long term (current) use of antithrombotics/antiplatelets: Secondary | ICD-10-CM | POA: Diagnosis not present

## 2020-08-04 DIAGNOSIS — Z87891 Personal history of nicotine dependence: Secondary | ICD-10-CM | POA: Diagnosis not present

## 2020-08-04 LAB — COMPREHENSIVE METABOLIC PANEL
ALT: 24 U/L (ref 0–44)
AST: 25 U/L (ref 15–41)
Albumin: 3.7 g/dL (ref 3.5–5.0)
Alkaline Phosphatase: 60 U/L (ref 38–126)
Anion gap: 10 (ref 5–15)
BUN: 18 mg/dL (ref 8–23)
CO2: 21 mmol/L — ABNORMAL LOW (ref 22–32)
Calcium: 9.6 mg/dL (ref 8.9–10.3)
Chloride: 109 mmol/L (ref 98–111)
Creatinine, Ser: 1.12 mg/dL — ABNORMAL HIGH (ref 0.44–1.00)
GFR, Estimated: 51 mL/min — ABNORMAL LOW (ref >60)
Glucose, Bld: 111 mg/dL — ABNORMAL HIGH (ref 70–99)
Potassium: 4.1 mmol/L (ref 3.5–5.1)
Sodium: 140 mmol/L (ref 135–145)
Total Bilirubin: 0.3 mg/dL (ref 0.3–1.2)
Total Protein: 6.9 g/dL (ref 6.5–8.1)

## 2020-08-04 LAB — URINALYSIS, ROUTINE W REFLEX MICROSCOPIC
Bilirubin Urine: NEGATIVE
Glucose, UA: NEGATIVE mg/dL
Hgb urine dipstick: NEGATIVE
Ketones, ur: NEGATIVE mg/dL
Leukocytes,Ua: NEGATIVE
Nitrite: NEGATIVE
Protein, ur: NEGATIVE mg/dL
Specific Gravity, Urine: 1.01 (ref 1.005–1.030)
pH: 6 (ref 5.0–8.0)

## 2020-08-04 LAB — CBC
HCT: 39.7 % (ref 36.0–46.0)
Hemoglobin: 13 g/dL (ref 12.0–15.0)
MCH: 30.9 pg (ref 26.0–34.0)
MCHC: 32.7 g/dL (ref 30.0–36.0)
MCV: 94.3 fL (ref 80.0–100.0)
Platelets: 187 10*3/uL (ref 150–400)
RBC: 4.21 MIL/uL (ref 3.87–5.11)
RDW: 13.3 % (ref 11.5–15.5)
WBC: 5.2 10*3/uL (ref 4.0–10.5)
nRBC: 0 % (ref 0.0–0.2)

## 2020-08-04 LAB — LIPASE, BLOOD: Lipase: 64 U/L — ABNORMAL HIGH (ref 11–51)

## 2020-08-04 MED ORDER — SODIUM CHLORIDE 0.9 % IV BOLUS: 1000.0000 mL | Freq: Once | INTRAVENOUS | Status: DC

## 2020-08-04 NOTE — ED Notes (Signed)
Pt stated that when she was in the waiting room she used the restroom. Pt stated it was a large amout that she urinated an does not have the urge to urinate. Pt attempted to use the restroom prior to going to radiology with no luck. Pt was bladder scanned with .

## 2020-08-04 NOTE — ED Provider Notes (Signed)
[facility] EMERGENCY DEPARTMENT Provider Note   CSN: [id number] Arrival date & time: 08/04/20  1816     History Chief Complaint  Patient presents with  . Abdominal Pain    Sara Villanueva is a 75 y.o. female hx of CAD, CHF, urinary retention, here with lower abdominal pain. Patient has been having lower abdominal pain for the last 2 weeks. Had outpatient CT on 2/28 that showed atherosclerotic plaque and patent common iliac stents and no obvious mesenteric ischemia. Patient was seen for the same symptoms a month ago at [facility] and had urinary retention that required foley. Foley has since then been removed by urology. Denies fever or urinary frequency or hematuria   The history is provided by the patient.       Past Medical History:  Diagnosis Date  . Arthritis   . Bladder disorder   . CAD (coronary artery disease)    stents x 3, 2013, 2017  . Carotid arterial disease (HCC)   . CHF (congestive heart failure) (HCC)   . Colon polyps   . Eczema   . High cholesterol   . Hypertension   . Lower GI bleed   . Rheumatoid arteritis (HCC)   . Rheumatoid arthritis [facility])     Patient Active Problem List   Diagnosis Date Noted  . Chronic systolic congestive heart failure (HCC) 05/12/2019  . Memory loss 03/21/2019  . Near syncope 02/25/2019  . UTI due to extended-spectrum beta lactamase (ESBL) producing Escherichia coli 02/25/2019  . Urinary retention with incomplete bladder emptying 02/25/2019  . Voiding dysfunction 02/14/2019  . Venous insufficiency (chronic) (peripheral) 12/08/2018  . 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 Mild cognitive impairment with memory loss 09/21/2018  . Acute cystitis without hematuria 09/09/2018  . Heme positive stool 03/28/2018  . S/P carotid endarterectomy 12/27/2017  . Chronic constipation 10/18/2017  . History of colon polyps 10/18/2017  . 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  Acute recurrent sinusitis 03/02/2017  . Anemia, unspecified 01/31/2016  . Knee pain, left 08/05/2015  . Allergic rhinitis 07/15/2015  . Stenosis of carotid artery 07/15/2015  . PVD (peripheral vascular disease) (HCC) 07/15/2015  . Renal artery stenosis (HCC) 07/15/2015  . Bilateral carotid artery stenosis 07/15/2015  . 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 Essential (primary) hypertension 06/13/2015  . HLD (hyperlipidemia) 06/13/2015  . Urgency of micturation 05/03/2015  . Urination frequency 05/03/2015  . Pedal edema 12/04/2014  . Dysesthesia 12/04/2014  . Nuclear sclerotic cataract 09/20/2014  . Lumbar radiculopathy 08/16/2014  . Spondylolisthesis at L5-S1 level 08/07/2014  . Neck pain 08/07/2014  . Chronic rheumatic arthritis (HCC) 08/07/2014  . Rheumatoid arthritis (HCC) 08/07/2014  . Angina pectoris (HCC) 08/03/2014  . Aseptic bony necrosis (HCC) 08/03/2014  . Aseptic necrosis of bone (HCC) 08/03/2014    Past Surgical History:  Procedure Laterality Date  . CAROTID ENDARTERECTOMY    . CORONARY ANGIOPLASTY WITH STENT PLACEMENT       OB History   No obstetric history on file.     Family History  Problem Relation Age of Onset  . Heart disease Mother   . Heart disease Father   . Allergic rhinitis Sister   . Asthma Neg Hx   . Eczema Neg Hx   . Urticaria Neg Hx   . Immunodeficiency Neg Hx   . Angioedema Neg Hx     Social History   Tobacco Use  . Smoking status: Former  Smoker    Packs/day: 0.50    Years: 25.00    Pack years: 12.50    Types: Cigarettes    Quit date: 2000    Years since quitting: 22.1  . Smokeless tobacco: Never Used  Vaping Use  . Vaping Use: Never used  Substance Use Topics  . Alcohol use: No    Alcohol/week: 0.0 standard drinks  . Drug use: No    Home Medications Prior to Admission medications   Medication Sig Start Date End Date Taking? Authorizing Provider  ascorbic acid (VITAMIN C) 100 MG tablet Take by mouth.    [provider]  busPIRone (BUSPAR) 15 MG tablet Take 15 mg by mouth 3 (three) times daily.  08/01/14   [provider]  cephALEXin (KEFLEX) 500 MG capsule Take 1 capsule (500 mg total) by mouth 2 (two) times daily. 03/11/20   Rolan Bucco, MD  cetirizine (ZYRTEC) 5 MG tablet Take 1 tablet (5 mg total) by mouth daily. 09/30/19   Fawze, Mina A, PA-C  clopidogrel (PLAVIX) 75 MG tablet Take 75 mg by mouth daily.    [provider]  donepezil (ARICEPT) 10 MG tablet Take 1 tablet (10 mg total) by mouth at bedtime. 09/20/19   Sater, Pearletha Furl, MD  guaiFENesin (MUCINEX) 600 MG 12 hr tablet Take 1 tablet (600 mg total) by mouth 2 (two) times daily as needed for cough. 09/30/19   Fawze, Mina A, PA-C  ibuprofen (ADVIL) 800 MG tablet Take by mouth. 07/22/19   [provider]  linaclotide (LINZESS) 145 MCG CAPS capsule Take 145 mcg by mouth once a week.  09/07/19   [provider]  meclizine (ANTIVERT) 12.5 MG tablet Take 1 tablet (12.5 mg total) by mouth 3 (three) times daily as needed for dizziness. 09/11/19   Linwood Dibbles, MD  MYRBETRIQ 50 MG TB24 tablet Take 50 mg by mouth daily. 08/10/19   [provider]  NITROSTAT 0.4 MG SL tablet Place 0.4 mg under the tongue every 5 (five) minutes as needed for chest pain.  07/08/14   [provider]  Triamcinolone Acetonide (NASACORT AQ NA) Place into the nose.    [provider]    Allergies    Other, Lamotrigine, Latex, and Nickel  Review of Systems   Review of Systems  Gastrointestinal: Positive for abdominal pain.  All other systems reviewed and are negative.   Physical Exam Updated Vital Signs BP (!) 178/78 (BP Location: Right Arm)   Pulse 75   Temp 97.6 F (36.4 C) (Oral)   Resp 16   Ht 5\' 8"  (1.727 m)   Wt 61.2 kg   SpO2 100%   BMI 20.53 kg/m   Physical Exam Vitals and nursing note reviewed.  Constitutional:      Comments: Chronically ill but not acutely ill   HENT:     Head:  Normocephalic.  Eyes:     Extraocular Movements: Extraocular movements intact.     Pupils: Pupils are equal, round, and reactive to light.  Cardiovascular:     Rate and Rhythm: Normal rate and regular rhythm.     Heart sounds: Normal heart sounds.  Pulmonary:     Effort: Pulmonary effort is normal.     Breath sounds: Normal breath sounds.  Abdominal:     General: Abdomen is flat.     Palpations: Abdomen is soft.     Comments: Mild suprapubic tenderness   Skin:    General: Skin is warm.  Capillary Refill: Capillary refill takes less than 2 seconds.  Neurological:     General: No focal deficit present.     Mental Status: She is alert.  Psychiatric:        Mood and Affect: Mood normal.     ED Results / Procedures / Treatments   Labs (all labs ordered are listed, but only abnormal results are displayed) Labs Reviewed  LIPASE, BLOOD - Abnormal; Notable for the following components:      Result Value   Lipase 64 (*)    All other components within normal limits  COMPREHENSIVE METABOLIC PANEL - Abnormal; Notable for the following components:   CO2 21 (*)    Glucose, Bld 111 (*)    Creatinine, Ser 1.12 (*)    GFR, Estimated 51 (*)    All other components within normal limits  CBC  URINALYSIS, ROUTINE W REFLEX MICROSCOPIC    EKG None  Radiology DG ABD ACUTE 2+V W 1V CHEST  Result Date: 08/04/2020 CLINICAL DATA:  Abdominal pain for 2 weeks EXAM: DG ABDOMEN ACUTE WITH 1 VIEW CHEST COMPARISON:  07/29/2020 FINDINGS: Cardiac shadow is within normal limits. Aortic calcifications are noted. Lungs are well aerated bilaterally with chronic scarring in the right apex. No focal infiltrate or sizable effusion is seen. No acute bony abnormality is noted. Scattered large and small bowel gas is noted. No abnormal mass or abnormal calcifications are noted. Diffuse vascular calcifications are seen with evidence of prior stenting involving the celiac axis and renal arteries bilaterally as well  as the common iliac arteries bilaterally. No free air is seen. Mild retained fecal material in the right colon is noted similar to that seen on prior CT examination. IMPRESSION: Chronic scarring in the right apex. Chronic changes in the abdomen consistent with mild constipation. Electronically Signed   By: Alcide Clever M.D.   On: 08/04/2020 20:56    Procedures Procedures   Medications Ordered in ED Medications - No data to display  ED Course  I have reviewed the triage vital signs and the nursing notes.  Pertinent labs & imaging results that were available during my care of the patient were reviewed by me and considered in my medical decision making (see chart for details).    MDM Rules/Calculators/A&P                          Ilina Xu is a 74 y.o. female here with suprapubic pain. Had 2 CTAs in the last month, one showed urinary retention. CTA a week ago showed atherosclerosis and no obvious . Will repeat lab work, UA, xrays. Will do bladder scan post void to see if she is in retention again.   10:45 PM Patient initially had bladder scan of 300. She urinated and post void residual was 20 cc. UA nl. CBC, CMP unremarkable. Lipase only 64. Xray showed mild constipation. She follows up with Dr. Earlene Plater from vascular. He called her 3 days ago to review her recent CTA and specifically commented that she has no mesenteric ischemia. She has vascular and urology follow up. Encouraged her to increase miralax to twice daily for constipation.   Final Clinical Impression(s) / ED Diagnoses Final diagnoses:  None    Rx / DC Orders ED Discharge Orders    None       Charlynne Pander, MD 08/04/20 2249

## 2020-08-04 NOTE — ED Notes (Signed)
ED Provider at bedside. 

## 2020-08-04 NOTE — Discharge Instructions (Signed)
Increase miralax to twice daily   See your vascular doctor and urologist for follow up   Return to ER if you have worse abdominal pain, vomiting, fever

## 2020-08-04 NOTE — ED Triage Notes (Signed)
Pt complains of constant abdominal pain x 2 weeks. Denies N/V/D. Able to tolerate PO. Denies urinary symptoms

## 2020-09-19 ENCOUNTER — Ambulatory Visit: Payer: Medicare Other | Admitting: Neurology

## 2020-10-23 IMAGING — CT CT ABD-PELV W/ CM
2 of 6 series · 15 of 46 positions shown, 17 images · IV contrast (APPLIED)
Comparison: 06/14/2014

CLINICAL DATA: Mid abdominal pain x1 month, worse over the past 3
scheduled to be repaired next week.

EXAM:
CT ABDOMEN AND PELVIS WITH CONTRAST
TECHNIQUE: Multidetector CT imaging of the abdomen and pelvis was performed
using the standard protocol following bolus administration of
intravenous contrast. Patient be noted that patient had
extravasation contrast at the entire bolus into the right
antecubital assessed by Dr. Ceejay by report with Dr. Devulder
notified. Contrast extravasation report was filled in safety zone
filled out per technologist report.
CONTRAST:  100mL GYU1HT-V99 IOPAMIDOL (GYU1HT-V99) INJECTION 61%

[Series 2: axial st · axial · 0.91mm/px · z∈[-530,-136]mm · 12 of 91 slices shown, 14 images]
[im 6/91  soft-tissue]
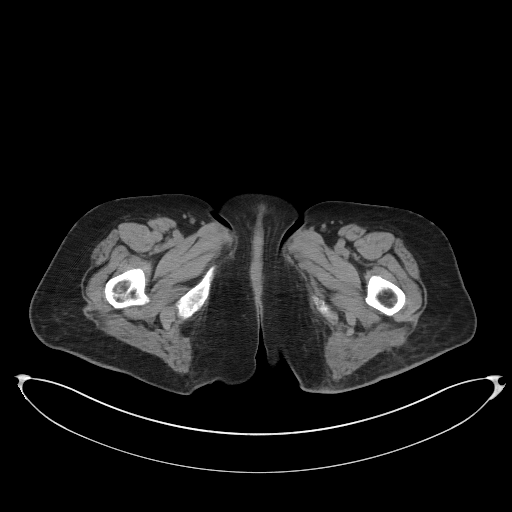
[im 6/91  bone]
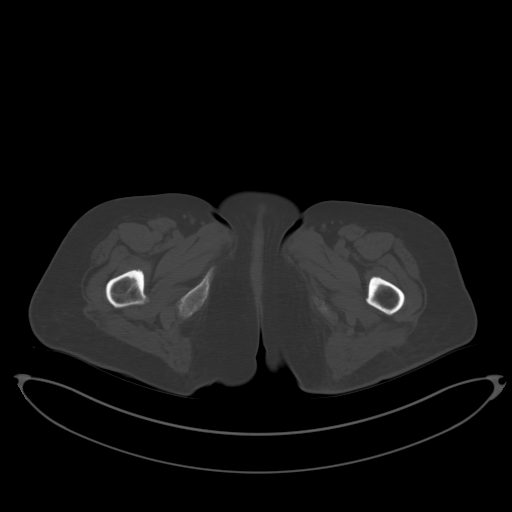
[im 12/91  soft-tissue]
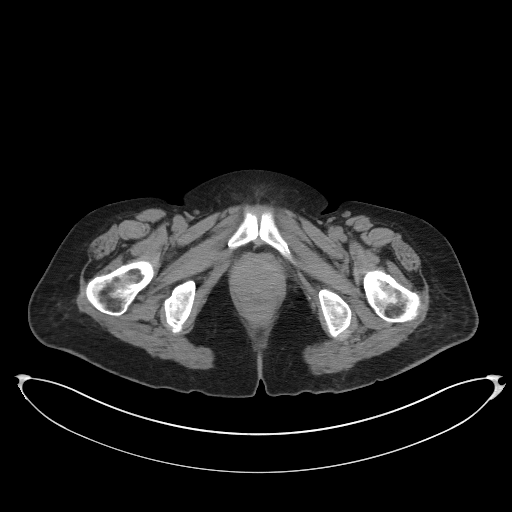
[im 23/91  soft-tissue]
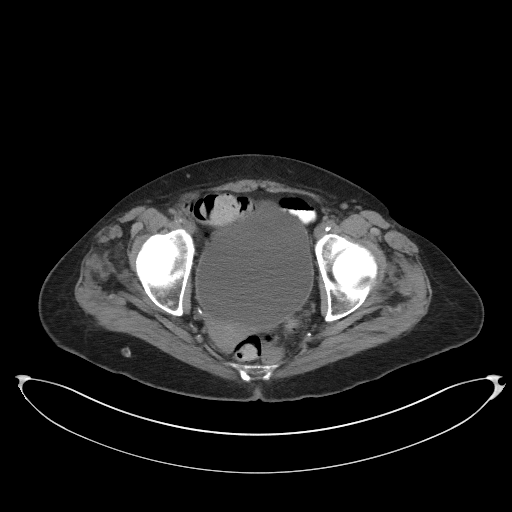
[im 29/91  soft-tissue]
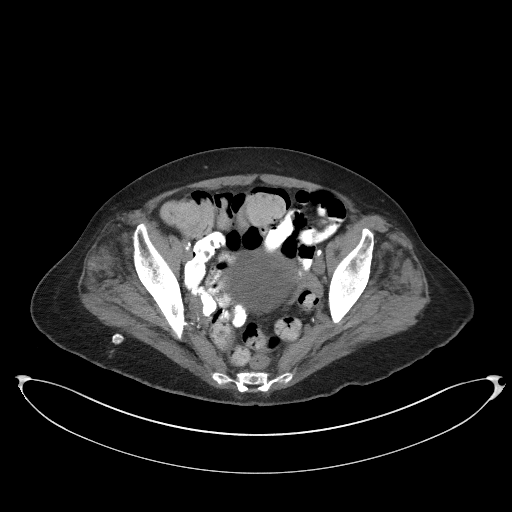
[im 34/91  soft-tissue]
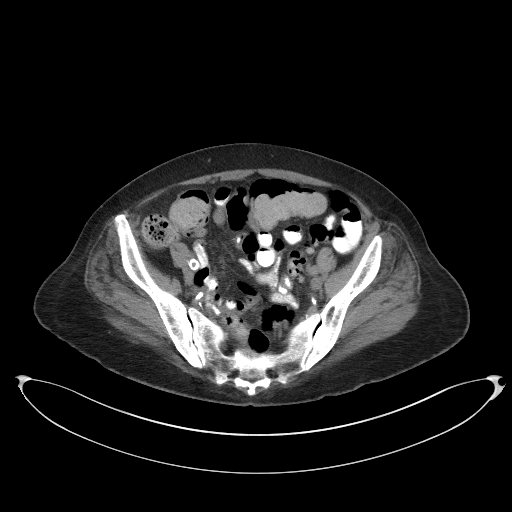
[im 40/91  soft-tissue]
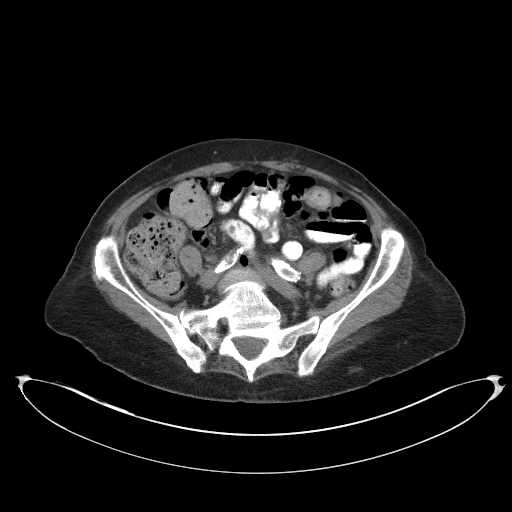
[im 51/91  soft-tissue]
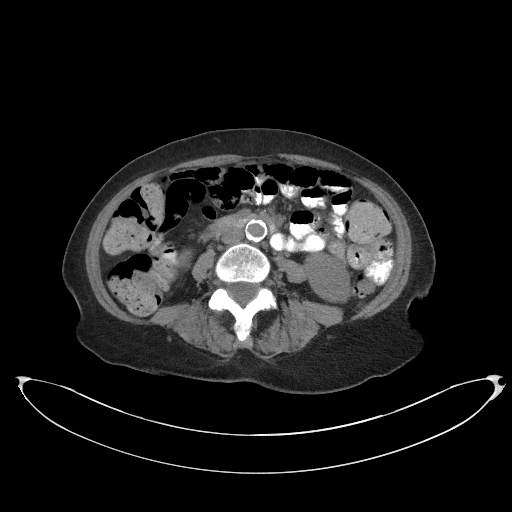
[im 57/91  soft-tissue]
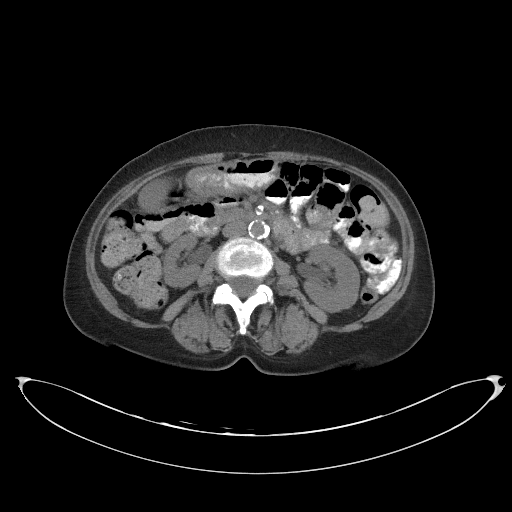
[im 62/91  soft-tissue]
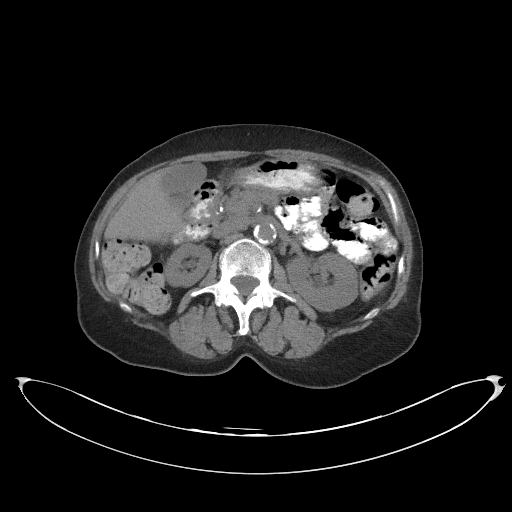
[im 62/91  bone]
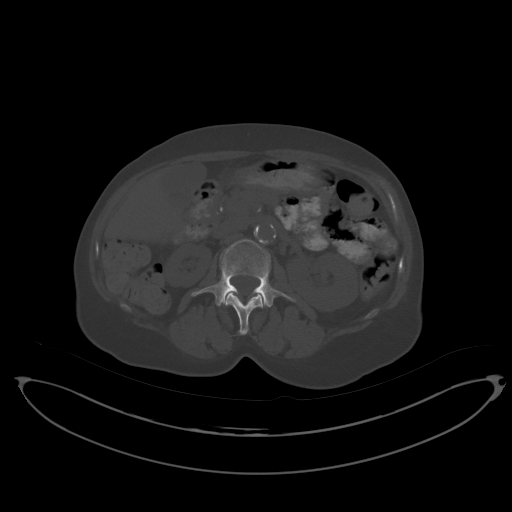
[im 68/91  soft-tissue]
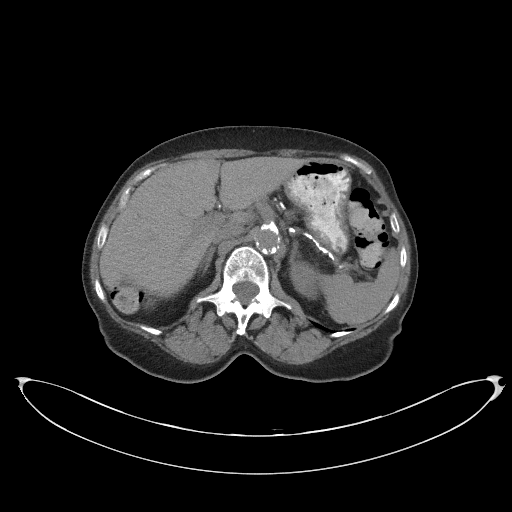
[im 79/91  soft-tissue]
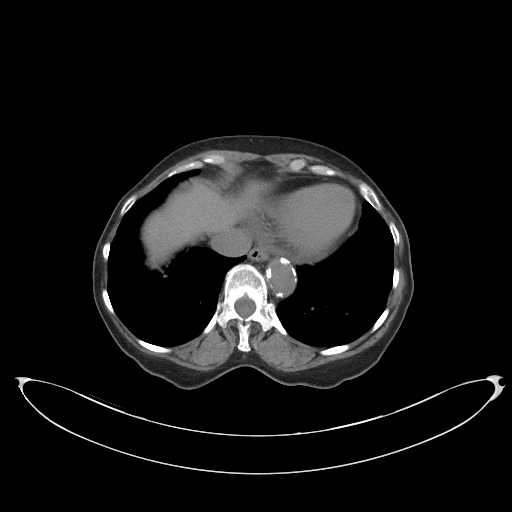
[im 85/91  soft-tissue]
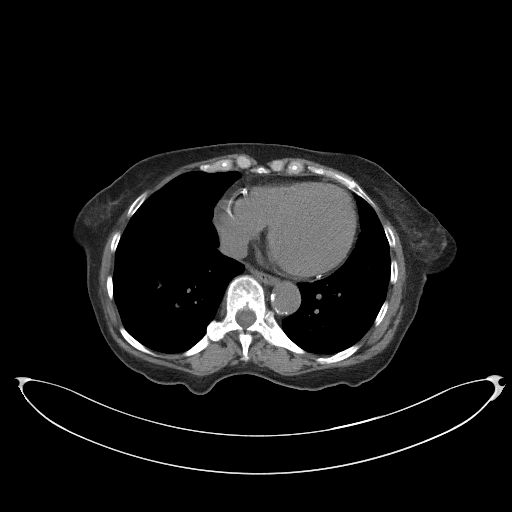

[Series 5: coronal st · coronal · 0.82mm/px · 3 of 76 slices shown]
[im 26/76  soft-tissue]
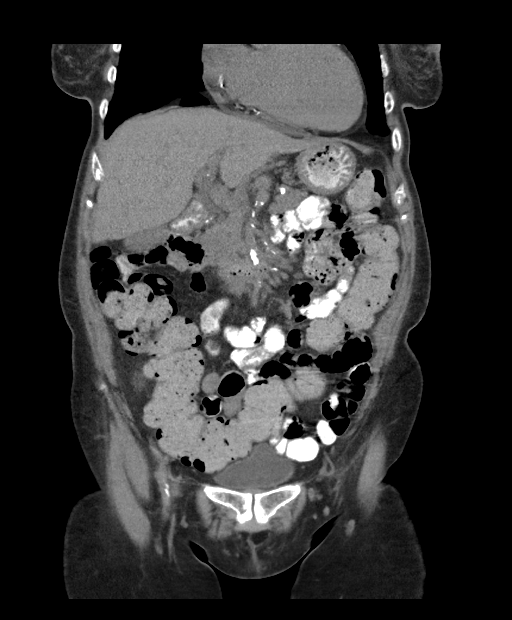
[im 34/76  soft-tissue]
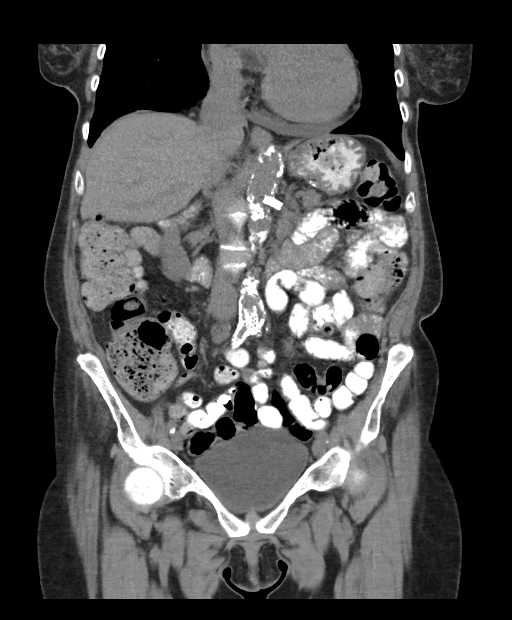
[im 42/76  soft-tissue]
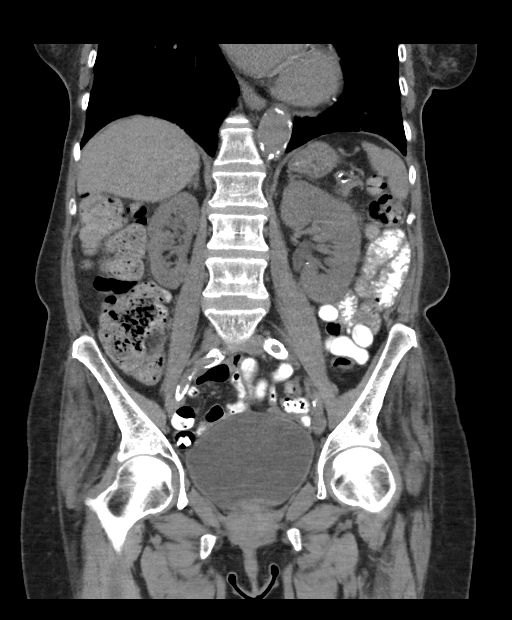

[15 of 46 positions shown; findings below may reference images not displayed]

FINDINGS: Lower chest: Top-normal heart size. Coronary arteriosclerosis is
noted. No pericardial effusion or thickening. No pulmonary
consolidations. Tiny tree-in-bud opacities at the right lung base
may reflect a small focus of bronchiolitis.

Hepatobiliary: Right hepatic granuloma. No biliary dilatation.
Physiologic distention of the gallbladder without stones. No
definite space-occupying mass of the liver is identified given
limitations due to suboptimal intravascular opacification from
contrast due to the above-stated contrast extravasation.

Pancreas: Atrophic pancreas without mass or ductal dilatation. No
inflammation

Spleen: Normal in size without focal abnormality.

Adrenals/Urinary Tract: Normal bilateral adrenal glands. Bilateral
nephrolithiasis with mild ectasia of the renal collecting systems
bilaterally. Smaller right kidney noted as before. 13 mm cortically
based hypodensity in the upper pole the left kidney consistent with
a cyst is identified. The urinary bladder is unremarkable for the
degree of distention.

Stomach/Bowel: Small hiatal hernia. The stomach is partially
opacified by enteric contrast. The duodenal sweep and ligament of
Treitz are normal. Normal small bowel rotation is seen. No
mechanical bowel obstruction. The distal and terminal ileum are
visualized with contrast noted within. A large amount of retained
stool is seen throughout the colon without evidence of colitis or
large bowel obstruction. Normal-appearing appendix.

Vascular/Lymphatic: The native aorta is densely calcified as are the
branch vessels. Renal artery stents are seen bilaterally.
Aortobifemoral stent grafts are noted.

Reproductive: Uterus and bilateral adnexa are unremarkable.

Other: No abdominal wall hernia or abnormality. No abdominopelvic
ascites.

Musculoskeletal: Multilevel degenerative facet arthrosis of the
lumbar spine. Assimilation joint at L5-S1 on right. For numbering
purposes, the lowest square vertebral body will be labeled L5 and
therefore there is L4-5 degenerative disc disease grade 1
anterolisthesis previously noted as L5-S1. AVN of the femoral heads
without flattening again noted.
IMPRESSION: Please note the patient experienced contrast extravasation of the
entirety of the 100 cc bolus into the arm and was evaluated as above
and within the patient's EMR and to be followed up as per protocol.

1. Moderate to marked stool retention throughout the colon without
inflammation or obstruction.
2. Stable 13 mm cyst in the upper pole left kidney. No obstructive
uropathy. Bilateral nephrolithiasis with mild ectasia of the renal
collecting systems.
3. Dense aortoiliac atherosclerosis with aorto bi-iliac stent grafts
place bilateral renal artery stents also noted.
4. L4-5 advanced degenerative disc disease with grade 1
anterolisthesis of L4 on L5. Assimilation joint at L5-S1 on the
right.
5. AVN of both femoral heads without collapse.

## 2020-12-05 ENCOUNTER — Other Ambulatory Visit: Payer: Self-pay

## 2020-12-05 ENCOUNTER — Encounter (HOSPITAL_BASED_OUTPATIENT_CLINIC_OR_DEPARTMENT_OTHER): Payer: Self-pay | Admitting: *Deleted

## 2020-12-05 ENCOUNTER — Other Ambulatory Visit (HOSPITAL_BASED_OUTPATIENT_CLINIC_OR_DEPARTMENT_OTHER): Payer: Self-pay

## 2020-12-05 ENCOUNTER — Emergency Department (HOSPITAL_BASED_OUTPATIENT_CLINIC_OR_DEPARTMENT_OTHER)
Admission: EM | Admit: 2020-12-05 | Discharge: 2020-12-05 | Disposition: A | Discharge status: Home / Self Care | Payer: Medicare Other | Attending: Emergency Medicine | Admitting: Emergency Medicine

## 2020-12-05 DIAGNOSIS — Z91040 Latex allergy status: Secondary | ICD-10-CM | POA: Insufficient documentation

## 2020-12-05 DIAGNOSIS — I5022 Chronic systolic (congestive) heart failure: Secondary | ICD-10-CM | POA: Insufficient documentation

## 2020-12-05 DIAGNOSIS — Z7902 Long term (current) use of antithrombotics/antiplatelets: Secondary | ICD-10-CM | POA: Diagnosis not present

## 2020-12-05 DIAGNOSIS — I2510 Atherosclerotic heart disease of native coronary artery without angina pectoris: Secondary | ICD-10-CM | POA: Diagnosis not present

## 2020-12-05 DIAGNOSIS — R42 Dizziness and giddiness: Secondary | ICD-10-CM | POA: Diagnosis present

## 2020-12-05 DIAGNOSIS — Z87891 Personal history of nicotine dependence: Secondary | ICD-10-CM | POA: Diagnosis not present

## 2020-12-05 DIAGNOSIS — I110 Hypertensive heart disease with heart failure: Secondary | ICD-10-CM | POA: Diagnosis not present

## 2020-12-05 DIAGNOSIS — Z955 Presence of coronary angioplasty implant and graft: Secondary | ICD-10-CM | POA: Insufficient documentation

## 2020-12-05 MED ORDER — MECLIZINE HCL 25 MG PO TABS
25.0000 mg | ORAL_TABLET | Freq: Once | ORAL | Status: AC
  Administered 2020-12-05: 25 mg via ORAL
  Filled 2020-12-05: qty 1

## 2020-12-05 MED ORDER — MECLIZINE HCL 25 MG PO TABS
25.0000 mg | ORAL_TABLET | Freq: Three times a day (TID) | ORAL | 0 refills | Status: DC | PRN
  Filled 2020-12-05: qty 30, 10d supply, fill #0

## 2020-12-05 NOTE — ED Triage Notes (Addendum)
States she has had vertigo x 2 days. Hx of same. She did not have Meclizine to take. Tingling in her arms and legs which she states she has with her vertigo.

## 2020-12-05 NOTE — Discharge Instructions (Addendum)
Take the Antivert as directed.  Follow-up with your doctor.  Return for any new or worse symptoms.

## 2020-12-05 NOTE — ED Provider Notes (Signed)
[facility] EMERGENCY DEPARTMENT Provider Note   CSN: [id number] Arrival date & time: 12/05/20  1146     History Chief Complaint  Patient presents with   Dizziness    Sara Villanueva is a 75 y.o. female.  Patient with a history of vertigo.  Last occurred in end of April.  And was treated with Antivert with a lot of success.  Patient with recurrent symptoms starting yesterday.  Typical for what happened before.  Patient has room spinning.  Made worse if she moves her head.  No speech problems no vision problems no upper or lower extremity numbness or weakness.  Patient was seen by her cardiology group yesterday.  And her HI NTS exam was negative for central vertigo.  And she was referred to the emergency department.  Patient had normal labs on June 21.      Past Medical History:  Diagnosis Date   Arthritis    Bladder disorder    CAD (coronary artery disease)    stents x 3, 2013, 2017   Carotid arterial disease (HCC)    CHF (congestive heart failure) (HCC)    Colon polyps    Eczema    High cholesterol    Hypertension    Lower GI bleed    Rheumatoid arteritis (HCC)    Rheumatoid arthritis [facility])     Patient Active Problem List   Diagnosis Date Noted   Chronic systolic congestive heart failure (HCC) 05/12/2019   Memory loss 03/21/2019   Near syncope 02/25/2019   UTI due to extended-spectrum beta lactamase (ESBL) producing Escherichia coli 02/25/2019   Urinary retention with incomplete bladder emptying 02/25/2019   Voiding dysfunction 02/14/2019   Venous insufficiency (chronic) (peripheral) 12/08/2018   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 10/27/2018   Mild cognitive impairment with memory loss 09/21/2018   Acute cystitis without hematuria 09/09/2018   Heme positive stool 03/28/2018   S/P carotid endarterectomy 12/27/2017   Chronic constipation 10/18/2017   History of colon polyps 10/18/2017   Stenosis of lateral recess of lumbar spine 03/22/2017    Acute recurrent sinusitis 03/02/2017   Anemia, unspecified 01/31/2016   Knee pain, left 08/05/2015   Allergic rhinitis 07/15/2015   Stenosis of carotid artery 07/15/2015   PVD (peripheral vascular disease) (HCC) 07/15/2015   Renal artery stenosis (HCC) 07/15/2015   Bilateral carotid artery stenosis 07/15/2015   Arterial vascular disease 06/13/2015   Essential (primary) hypertension 06/13/2015   HLD (hyperlipidemia) 06/13/2015   Urgency of micturation 05/03/2015   Urination frequency 05/03/2015   Pedal edema 12/04/2014   Dysesthesia 12/04/2014   Nuclear sclerotic cataract 09/20/2014   Lumbar radiculopathy 08/16/2014   Spondylolisthesis at L5-S1 level 08/07/2014   Neck pain 08/07/2014   Chronic rheumatic arthritis (HCC) 08/07/2014   Rheumatoid arthritis (HCC) 08/07/2014   Angina pectoris (HCC) 08/03/2014   Aseptic bony necrosis (HCC) 08/03/2014   Aseptic necrosis of bone (HCC) 08/03/2014    Past Surgical History:  Procedure Laterality Date   CAROTID ENDARTERECTOMY     CORONARY ANGIOPLASTY WITH STENT PLACEMENT       OB History   No obstetric history on file.     Family History  Problem Relation Age of Onset   Heart disease Mother    Heart disease Father    Allergic rhinitis Sister    Asthma Neg Hx    Eczema Neg Hx    Urticaria Neg Hx    Immunodeficiency Neg Hx    Angioedema Neg Hx  Social History   Tobacco Use   Smoking status: Former    Packs/day: 0.50    Years: 25.00    Pack years: 12.50    Types: Cigarettes    Quit date: 2000    Years since quitting: 22.5   Smokeless tobacco: Never  Vaping Use   Vaping Use: Never used  Substance Use Topics   Alcohol use: No    Alcohol/week: 0.0 standard drinks   Drug use: No    Home Medications Prior to Admission medications   Medication Sig Start Date End Date Taking? Authorizing Provider  meclizine (ANTIVERT) 25 MG tablet Take 1 tablet (25 mg total) by mouth 3 (three) times daily as needed for dizziness.  12/05/20  Yes Vanetta Mulders, MD  ascorbic acid (VITAMIN C) 100 MG tablet Take by mouth.    [provider]  busPIRone (BUSPAR) 15 MG tablet Take 15 mg by mouth 3 (three) times daily.  08/01/14   [provider]  cephALEXin (KEFLEX) 500 MG capsule Take 1 capsule (500 mg total) by mouth 2 (two) times daily. 03/11/20   Rolan Bucco, MD  cetirizine (ZYRTEC) 5 MG tablet Take 1 tablet (5 mg total) by mouth daily. 09/30/19   Fawze, Mina A, PA-C  clopidogrel (PLAVIX) 75 MG tablet Take 75 mg by mouth daily.    [provider]  donepezil (ARICEPT) 10 MG tablet Take 1 tablet (10 mg total) by mouth at bedtime. 09/20/19   Sater, Pearletha Furl, MD  guaiFENesin (MUCINEX) 600 MG 12 hr tablet Take 1 tablet (600 mg total) by mouth 2 (two) times daily as needed for cough. 09/30/19   Fawze, Mina A, PA-C  ibuprofen (ADVIL) 800 MG tablet Take by mouth. 07/22/19   [provider]  linaclotide (LINZESS) 145 MCG CAPS capsule Take 145 mcg by mouth once a week.  09/07/19   [provider]  meclizine (ANTIVERT) 12.5 MG tablet Take 1 tablet (12.5 mg total) by mouth 3 (three) times daily as needed for dizziness. 09/11/19   Linwood Dibbles, MD  MYRBETRIQ 50 MG TB24 tablet Take 50 mg by mouth daily. 08/10/19   [provider]  NITROSTAT 0.4 MG SL tablet Place 0.4 mg under the tongue every 5 (five) minutes as needed for chest pain.  07/08/14   [provider]  Triamcinolone Acetonide (NASACORT AQ NA) Place into the nose.    [provider]    Allergies    Other, Lamotrigine, Latex, and Nickel  Review of Systems   Review of Systems  Constitutional:  Negative for chills and fever.  HENT:  Negative for ear pain and sore throat.   Eyes:  Negative for pain and visual disturbance.  Respiratory:  Negative for cough and shortness of breath.   Cardiovascular:  Negative for chest pain and palpitations.  Gastrointestinal:  Negative for abdominal pain and vomiting.  Genitourinary:   Negative for dysuria and hematuria.  Musculoskeletal:  Negative for arthralgias and back pain.  Skin:  Negative for color change and rash.  Neurological:  Positive for dizziness. Negative for seizures, syncope, speech difficulty, weakness, light-headedness, numbness and headaches.  All other systems reviewed and are negative.  Physical Exam Updated Vital Signs BP (!) 151/76   Pulse 65   Temp 98.3 F (36.8 C) (Oral)   Resp 15   Ht 1.727 m (5\' 8" )   Wt 61.2 kg   SpO2 100%   BMI 20.51 kg/m   Physical Exam Vitals and nursing note reviewed.  Constitutional:  General: She is not in acute distress.    Appearance: Normal appearance. She is well-developed.  HENT:     Head: Normocephalic and atraumatic.  Eyes:     Extraocular Movements: Extraocular movements intact.     Conjunctiva/sclera: Conjunctivae normal.     Pupils: Pupils are equal, round, and reactive to light.  Cardiovascular:     Rate and Rhythm: Normal rate and regular rhythm.     Heart sounds: No murmur heard. Pulmonary:     Effort: Pulmonary effort is normal. No respiratory distress.     Breath sounds: Normal breath sounds.  Abdominal:     Palpations: Abdomen is soft.     Tenderness: There is no abdominal tenderness.  Musculoskeletal:        General: No swelling.     Cervical back: Normal range of motion and neck supple. No rigidity or tenderness.  Skin:    General: Skin is warm and dry.     Capillary Refill: Capillary refill takes less than 2 seconds.  Neurological:     Mental Status: She is alert and oriented to person, place, and time.     Cranial Nerves: No cranial nerve deficit.     Sensory: No sensory deficit.     Motor: No weakness.     Comments: Patient with reproducible room spinning with moving head to the left then to the center and then right to the center.    ED Results / Procedures / Treatments   Labs (all labs ordered are listed, but only abnormal results are displayed) Labs Reviewed - No  data to display  EKG EKG Interpretation  Date/Time:  Thursday December 05 2020 12:09:42 EDT Ventricular Rate:  87 PR Interval:  180 QRS Duration: 99 QT Interval:  369 QTC Calculation: 444 R Axis:   61 Text Interpretation: Sinus rhythm Right atrial enlargement LVH with secondary repolarization abnormality No significant change since last tracing Confirmed by Vanetta Mulders [phone]) on 12/05/2020 12:39:32 PM  Radiology No results found.  Procedures Procedures   Medications Ordered in ED Medications  meclizine (ANTIVERT) tablet 25 mg (25 mg Oral Given 12/05/20 1312)    ED Course  I have reviewed the triage vital signs and the nursing notes.  Pertinent labs & imaging results that were available during my care of the patient were reviewed by me and considered in my medical decision making (see chart for details).    MDM Rules/Calculators/A&P                          Patient presentation very consistent with benign positional vertigo.  Patient had similar event in April.  Not better on Antivert.  Resolved until yesterday.  Do not feel that this is a central event.  Patient treated here with Antivert feeling much better.  We will continue Antivert and have her follow-up with her primary care doctor.  Final Clinical Impression(s) / ED Diagnoses Final diagnoses:  Vertigo    Rx / DC Orders ED Discharge Orders          Ordered    meclizine (ANTIVERT) 25 MG tablet  3 times daily PRN        12/05/20 1434             Vanetta Mulders, MD 12/05/20 1439

## 2020-12-05 NOTE — ED Notes (Signed)
Reports dizzy today and yesterday too.  Has vertigo often.

## 2020-12-23 ENCOUNTER — Telehealth: Payer: Self-pay | Admitting: Neurology

## 2020-12-23 NOTE — Telephone Encounter (Signed)
Pt's daughter called asking for an annual f/u for pt's memory, she also said she wants to discuss vertigo with Dr Epimenio Foot as well.  Daughter was informed office is based and that Dr Epimenio Foot can only see pt for what the referral is for.  Daughter asked that the appointment still be made for pt's memory and stated she will attend the appointment with pt.  This is FYI, no call back requested.

## 2021-01-01 ENCOUNTER — Ambulatory Visit (INDEPENDENT_AMBULATORY_CARE_PROVIDER_SITE_OTHER): Payer: Medicare Other | Admitting: Neurology

## 2021-01-01 ENCOUNTER — Encounter: Payer: Self-pay | Admitting: Neurology

## 2021-01-01 VITALS — BP 126/72 | HR 94 | Ht 68.0 in | Wt 119.5 lb

## 2021-01-01 DIAGNOSIS — H814 Vertigo of central origin: Secondary | ICD-10-CM | POA: Insufficient documentation

## 2021-01-01 DIAGNOSIS — I6529 Occlusion and stenosis of unspecified carotid artery: Secondary | ICD-10-CM

## 2021-01-01 DIAGNOSIS — R413 Other amnesia: Secondary | ICD-10-CM | POA: Diagnosis not present

## 2021-01-01 DIAGNOSIS — R55 Syncope and collapse: Secondary | ICD-10-CM | POA: Diagnosis not present

## 2021-01-01 MED ORDER — MECLIZINE HCL 25 MG PO TABS
25.0000 mg | ORAL_TABLET | Freq: Three times a day (TID) | ORAL | 1 refills | Status: DC | PRN
Start: 2021-01-01 — End: 2021-10-29

## 2021-01-01 MED ORDER — DONEPEZIL HCL 10 MG PO TABS: 5.0000 mg | ORAL_TABLET | Freq: Every day | ORAL | 11 refills | Status: DC

## 2021-01-01 NOTE — Progress Notes (Signed)
[facility]  PATIENT: Sara Villanueva DOB: [birth year]  REFERRING DOCTOR OR PCP:  Olivia Mackie SOURCE: patient  _________________________________   HISTORICAL  CHIEF COMPLAINT:  Chief Complaint  Patient presents with   Follow-up    Rm 1, w daughter Alcario Drought. Here for annual memory f/u, pt reports no change in memory since last ov. Pt would like to discuss her vertigo and recent CT scan, pt taking meclizine. Husband passed away 3 months ago.     HISTORY OF PRESENT ILLNESS:  She is a 75 yo woman with chronic lower back pain and memory loss  Update  01/01/2021: She continues to experience memory loss that is mostly stable.  She is forgetful and misplaces items.    She is noting more issues with vertigo.   She had recent CT showing stable mild atrophy and chronic microvascular ischemic change.     The vertigo started back up a mont ago.  It is constant a t a low level and worse with movements.    Meclizine helped initially at 25 mg tid but not 12.5 tid.   She may have felt   She scored 15/30 on the MoCA today.    Sightly progressed since 2019.  At the last visit, she scored 19/30 on the [facility] cognitive assessment.  Imaging studies did not show definite hippocampal atrophy so I was reluctant to officially diagnose her with Alzheimer's disease.  We did start donepezil 5 mg.   Tolerated it well but discontinued at some time.  Anticholinergic agents were stopped.   She still has episodes of near-syncope.   She has carotid arterial disease and underwent right carotid endarterectomy July 2019.  She follows up with vascular surgery.  She also has had bilateral renal artery stents and has coronary artery disease.  Vascular risk factors include hyperlipidemia and HTN.   She is scheduled to have carotid U/S again next week.   She has not had any more episodes of severe lightheadedness.  She is sleeping well most nights.  She feels mood is doing better with 8 hours of sleep.     She has not needed any medication for anxiety recently.      She had two stents placed in arteries for the GI system.  She is on Plavix (was on DAPT but aspirin was d/c).  She exercises with a treadmill.    She had her second vaccination a couple weeks ago.     More stress with husband passing 3 months ago.    Montreal Cognitive Assessment  01/01/2021 01/01/2021 03/21/2019  Visuospatial/ Executive (0/5) 0 (No Data) 1  Naming (0/3) Attention: Read list of digits (0/2) Attention: Read list of letters (0/1) 0 0 1  Attention: Serial 7 subtraction starting at 100 (0/3) Language: Repeat phrase (0/2) Language : Fluency (0/1) 0 0 0  Abstraction (0/2) Delayed Recall (0/5) 0 0 2  Orientation (0/6) Total 15 - 18  Adjusted Score (based on education) 16 - 19     Update 03/21/2019: She has noted more difficulties with her memory.   She first noted a problem last year when she was misplacing items.   She is not noting issues with other cognitive tasks such as math or language.    STM was 2/5 at 3 minutes and 1/5 at 20 minutes 3/5 with prompts.  She is sleeping poorly due to her overactive sleep and feels she is sleeping only 3-4 hours.   She does not feel refreshed when she wakes up.  She had an episode of syncope and presented to the emergency room.  She had a CT scan which showed no acute findings..  I personally reviewed the CT scan of the head performed 02/24/2019.   She has mild generalized atrophy but no acute findings.  Echocardiogram showed severe left atrial enlargement mild mitral valve regurg and a reduced ejection fraction of 45 to 50%.  There was diastolic dysfunction.  She has carotid arterial disease and underwent right carotid endarterectomy July 2019.  She follows up with vascular surgery.  She also has had bilateral renal artery stents and has coronary artery disease.  Vascular risk factors include hyperlipidemia and HTN.   She is scheduled to  have carotid U/S again next week.     She has had urinary retention and will be having cystoscopy soon.  She is currently doing self-catheterization.     TSH, B12, Vit D were all normal over th past year.  She has RA and chronic lumbar pain.        Update 12/14/2017: She feels her back is much better since she was diagnosed with RA and was started on Remicade.    She sees Dr. Dierdre Forth of Rheumatology.   It has helped her more than the ESI did.      She has had a carotid doppler recently and has 80% right ICA and has 60% left ICA stenosis.   She is scheduled to have a right CEA.     She is noting more swelling in her left ankle.    She notes some pain there in the afternoons.     She is on meloxicam and gabapentin for her pain.       Update 06/15/2017:  She has pain that is mostly in the back but also near the inside left knee and posterior ankle.    She feels gabapentin used to help her but not much now.   Last year, she had an epidural steroid injection for probable left L5 radiculopathy but did not receive any benefit. She sore neurosurgery (Dr. Petra Kuba), but surgery was not felt to be a good option for her. Also of note, she has rheumatoid arthritis.  I personally reviewed an MRI from [facility] performed last month. It shows spondylolisthesis of L5 upon S1 associated with facet hypertrophy and disc bulging of the uncovered disc. There is moderately severe left foraminal narrowing with possible left L5 nerve root compression. Additionally there is moderate bilateral lateral recess stenosis. No nerve root impingement at other levels.  She also reports some neck pain but that has been pretty stable and does not bother her much at this time.  From 01/25/2017:  Low back pain: She is reporting more pain going down the left leg. Previously her pain was predominantly in the lower back but now it is worse in the leg than the back. She denies any weakness but does note some sensory  changes in the foot. There is no problems with bowel or bladder function.   She sees Dr. Dierdre Forth of Rheum (has RA) and was referred to Dr. Shon Baton.   She had one ESI with him but she feels she did not get any benefit.   She has significant spondylolisthesis at L5-S1 and by history and exam appears to have an L5 radiculopathy on the left. I  had done several epidural steroids on her years ago but we do not have the equipment at the current office.   Trigger point injections have helped her some in the past but we are trying to limit these as she has avascular necrosis. Current low back pain is milder but the left leg pain is more severe. It is intermittent and will increase with certain positions. Gabapentin initially seemed to help but not as much recently.  Dysesthesia:   She has a dysesthetic sensation in the left foot and in her fingers.   REVIEW OF SYSTEMS: Constitutional: No fevers, chills, sweats, or change in appetite Eyes: No visual changes, double vision, eye pain Ear, nose and throat: No hearing loss, ear pain, nasal congestion, sore throat Cardiovascular: No chest pain, palpitations Respiratory:  No shortness of breath at rest or with exertion.   No wheezes GastrointestinaI: No nausea, vomiting, diarrhea, abdominal pain, fecal incontinence Genitourinary:  No dysuria, urinary retention or frequency.  No nocturia. Musculoskeletal:  Mild neck pain; moderate back pain and left hip pain Integumentary: No rash, pruritus, skin lesions Neurological: as above Psychiatric: No depression at this time.  No anxiety Endocrine: No palpitations, diaphoresis, change in appetite, change in weigh or increased thirst Hematologic/Lymphatic:  No anemia, purpura, petechiae. Allergic/Immunologic: No itchy/runny eyes, nasal congestion, recent allergic reactions, rashes  ALLERGIES: Allergies  Allergen Reactions   Other Other (See Comments)    Prefers paper tape, allergic to tree pollen   Lamotrigine Rash    Latex Other (See Comments) and Rash    unknown    Nickel Rash    HOME MEDICATIONS:  Current Outpatient Medications:    ascorbic acid (VITAMIN C) 100 MG tablet, Take by mouth., Disp: , Rfl:    clopidogrel (PLAVIX) 75 MG tablet, Take 75 mg by mouth daily., Disp: , Rfl:    donepezil (ARICEPT) 10 MG tablet, Take 0.5 tablets (5 mg total) by mouth at bedtime., Disp: 30 tablet, Rfl: 11   meclizine (ANTIVERT) 25 MG tablet, Take 1 tablet (25 mg total) by mouth 3 (three) times daily as needed for dizziness., Disp: 90 tablet, Rfl: 1   NITROSTAT 0.4 MG SL tablet, Place 0.4 mg under the tongue every 5 (five) minutes as needed for chest pain. , Disp: , Rfl:    Triamcinolone Acetonide (NASACORT AQ NA), Place into the nose., Disp: , Rfl:   PAST MEDICAL HISTORY: Past Medical History:  Diagnosis Date   Arthritis    Bladder disorder    CAD (coronary artery disease)    stents x 3, 2013, 2017   Carotid arterial disease (HCC)    CHF (congestive heart failure) (HCC)    Colon polyps    Eczema    High cholesterol    Hypertension    Lower GI bleed    Rheumatoid arteritis (HCC)    Rheumatoid arthritis (HCC)     PAST SURGICAL HISTORY: Past Surgical History:  Procedure Laterality Date   CAROTID ENDARTERECTOMY     CORONARY ANGIOPLASTY WITH STENT PLACEMENT      FAMILY HISTORY: Family History  Problem Relation Age of Onset   Heart disease Mother    Heart disease Father    Allergic rhinitis Sister    Asthma Neg Hx    Eczema Neg Hx    Urticaria Neg Hx    Immunodeficiency Neg Hx    Angioedema Neg Hx     SOCIAL HISTORY:  Social History   Socioeconomic History   Marital status: Widowed  Spouse name: Not on file   Number of children: Not on file   Years of education: Not on file   Highest education level: Not on file  Occupational History   Not on file  Tobacco Use   Smoking status: Former    Packs/day: 0.50    Years: 25.00    Pack years: 12.50    Types: Cigarettes    Quit date:  2000    Years since quitting: 22.6   Smokeless tobacco: Never  Vaping Use   Vaping Use: Never used  Substance and Sexual Activity   Alcohol use: No    Alcohol/week: 0.0 standard drinks   Drug use: No   Sexual activity: Not on file  Other Topics Concern   Not on file  Social History Narrative   Not on file   Social Determinants of Health   Financial Resource Strain: Not on file  Food Insecurity: Not on file  Transportation Needs: Not on file  Physical Activity: Not on file  Stress: Not on file  Social Connections: Not on file  Intimate Partner Violence: Not on file     PHYSICAL EXAM  Vitals:   01/01/21 1052  BP: 126/72  Pulse: 94  Weight: 119 lb 8 oz (54.2 kg)  Height:  (1.727 m)    Body mass index is 18.17 kg/m.   General: The patient is well-developed and well-nourished and in no acute distress.  Tympanic membranes intact  Skin/Musculoskeletal:    There is mild pedal edema.  No rashes.  She does not have any significant tenderness over the lower lumbar region or the piriformis muscles.    Neurologic Exam  Mental status: The patient is alert and oriented x 3 at the time of the examination.    Cranial nerves: Extraocular movements are full.    Facial strength and sensation is normal.  Hearing is normal and appears to be symmetric.  The Weber test did not lateralize.  Motor:  Muscle bulk is normal.   Tone is normal. Strength is 5/5 in the arms and legs except for 4+/5 EHL strength on the left..   Sensory: She has intact sensation to touch and vibration in the hands. In the legs, she has symmetric vibration sensation.  There is reduced sensation in the L5 distribution on the left.  Gait and station: Station is normal.   The gait is arthritic. The tandem gait is wide. There is no Romberg sign.   Reflexes: Deep tendon reflexes are symmetric and normal bilaterally.     Maneuvers: The Dix-Hallpike maneuver did not evoked nystagmus to either direction.   However, she did have mild vertigo with the move was done.    DIAGNOSTIC DATA (LABS, IMAGING, TESTING) - I reviewed patient records, labs, notes, testing and imaging myself where available.  Lab Results  Component Value Date   WBC 5.2 08/04/2020   HGB 13.0 08/04/2020   HCT 39.7 08/04/2020   MCV 94.3 08/04/2020   PLT 187 08/04/2020      Component Value Date/Time   NA 140 08/04/2020 1920   K 4.1 08/04/2020 1920   CL 109 08/04/2020 1920   CO2 21 (L) 08/04/2020 1920   GLUCOSE 111 (H) 08/04/2020 1920   BUN 18 08/04/2020 1920   CREATININE 1.12 (H) 08/04/2020 1920   CALCIUM 9.6 08/04/2020 1920   PROT 6.9 08/04/2020 1920   ALBUMIN 3.7 08/04/2020 1920   AST 25 08/04/2020 1920   ALT 24 08/04/2020 1920   ALKPHOS 60  08/04/2020 1920   BILITOT 0.3 08/04/2020 1920   GFRNONAA 51 (L) 08/04/2020 1920   GFRAA >60 11/04/2019 1326       ASSESSMENT AND PLAN  Vertigo of central origin - Plan: Ambulatory referral to Physical Therapy, MR BRAIN/IAC W WO CONTRAST  Memory loss  Stenosis of carotid artery, unspecified laterality  Near syncope   1.   restart donepezil 5 mg for probable AD.  The dose can be increased to 10 mg if tolerated..  2.   Due to the vertigo, we will check an MRI of the brain with attention to the internal auditory canals.  Additionally I will send her for vestibular rehab therapy and renew her meclizine.   3.   return to clinic in 12 months or sooner based on results or if there are new or worsening neurologic symptoms.  Lennex Pietila A. Epimenio Foot, MD, PhD 01/01/2021, 6:35 PM Certified in Neurology, Clinical Neurophysiology, Sleep Medicine, Pain Medicine and Neuroimaging  [facility] [address], [address] [place], [place] [address] [phone]

## 2021-01-06 ENCOUNTER — Telehealth: Payer: Self-pay | Admitting: Neurology

## 2021-01-06 NOTE — Telephone Encounter (Signed)
Medicare/bcbs fed order sent to GI. No auth they will reach out to the patient to schedule.  °

## 2021-01-13 ENCOUNTER — Other Ambulatory Visit: Payer: Self-pay

## 2021-01-13 ENCOUNTER — Ambulatory Visit: Payer: Medicare Other | Attending: Neurology | Admitting: Physical Therapy

## 2021-01-13 ENCOUNTER — Emergency Department (HOSPITAL_BASED_OUTPATIENT_CLINIC_OR_DEPARTMENT_OTHER)
Admission: EM | Admit: 2021-01-13 | Discharge: 2021-01-13 | Disposition: A | Discharge status: Home / Self Care | Payer: Medicare Other | Attending: Emergency Medicine | Admitting: Emergency Medicine

## 2021-01-13 ENCOUNTER — Encounter (HOSPITAL_BASED_OUTPATIENT_CLINIC_OR_DEPARTMENT_OTHER): Payer: Self-pay

## 2021-01-13 ENCOUNTER — Encounter: Payer: Self-pay | Admitting: Physical Therapy

## 2021-01-13 DIAGNOSIS — Z91040 Latex allergy status: Secondary | ICD-10-CM | POA: Diagnosis not present

## 2021-01-13 DIAGNOSIS — R42 Dizziness and giddiness: Secondary | ICD-10-CM | POA: Insufficient documentation

## 2021-01-13 DIAGNOSIS — I110 Hypertensive heart disease with heart failure: Secondary | ICD-10-CM | POA: Diagnosis not present

## 2021-01-13 DIAGNOSIS — I2510 Atherosclerotic heart disease of native coronary artery without angina pectoris: Secondary | ICD-10-CM | POA: Insufficient documentation

## 2021-01-13 DIAGNOSIS — I5022 Chronic systolic (congestive) heart failure: Secondary | ICD-10-CM | POA: Insufficient documentation

## 2021-01-13 DIAGNOSIS — R293 Abnormal posture: Secondary | ICD-10-CM | POA: Insufficient documentation

## 2021-01-13 DIAGNOSIS — H538 Other visual disturbances: Secondary | ICD-10-CM | POA: Diagnosis not present

## 2021-01-13 DIAGNOSIS — R519 Headache, unspecified: Secondary | ICD-10-CM | POA: Insufficient documentation

## 2021-01-13 DIAGNOSIS — Z87891 Personal history of nicotine dependence: Secondary | ICD-10-CM | POA: Insufficient documentation

## 2021-01-13 DIAGNOSIS — R2681 Unsteadiness on feet: Secondary | ICD-10-CM | POA: Insufficient documentation

## 2021-01-13 MED ORDER — DIAZEPAM 5 MG PO TABS
2.5000 mg | ORAL_TABLET | Freq: Once | ORAL | Status: AC
  Administered 2021-01-13: 2.5 mg via ORAL
  Filled 2021-01-13: qty 1

## 2021-01-13 MED ORDER — DIAZEPAM 5 MG PO TABS: 2.5000 mg | ORAL_TABLET | Freq: Two times a day (BID) | ORAL | 0 refills | Status: DC | PRN

## 2021-01-13 NOTE — Addendum Note (Signed)
Addended by: Jena Gauss on: 01/13/2021 12:11 PM   Modules accepted: Orders

## 2021-01-13 NOTE — ED Triage Notes (Signed)
Pt c/o dizziness x 5 weeks-seen by neuro dx with vertigo-states she has MRI scheduled in 2 weeks-NAD-to triage in w/c

## 2021-01-13 NOTE — ED Provider Notes (Signed)
[facility] EMERGENCY DEPARTMENT Provider Note   CSN: [id number] Arrival date & time: 01/13/21  1122     History Chief Complaint  Patient presents with   Dizziness    Sara Villanueva is a 75 y.o. female.  HPI 75 year old female presents for vertigo.  She was upstairs getting physical therapy and according to the patient the physical therapist saw nystagmus and sent her to the ED for evaluation.  Patient has been dealing with vertigo for about 5 weeks.  She has trouble walking but is able to lean on the wall or her daughter and has been able to get around.  She has been having a daily headache since.  She was seen by an ear nose throat at the end of July and had a negative CT head and has an MRI in a couple weeks coming up.  Today the headache was a little worse than typical but no vomiting or weakness/numbness.  The dizziness is pretty much constant whenever she tries to stand up or walk.  She has been trying meclizine which at first helped but is no longer effective.  She has some blurry vision which has been ongoing the whole time but no new vision changes.  Past Medical History:  Diagnosis Date   Arthritis    Bladder disorder    CAD (coronary artery disease)    stents x 3, 2013, 2017   Carotid arterial disease (HCC)    CHF (congestive heart failure) (HCC)    Colon polyps    Eczema    High cholesterol    Hypertension    Lower GI bleed    Rheumatoid arteritis (HCC)    Rheumatoid arthritis (HCC)     Patient Active Problem List   Diagnosis Date Noted   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   Chronic systolic congestive heart failure (HCC) 05/12/2019   Memory loss 03/21/2019   Near syncope 02/25/2019   UTI due to extended-spectrum beta lactamase (ESBL) producing Escherichia coli 02/25/2019   Urinary retention with incomplete bladder emptying 02/25/2019   Voiding dysfunction 02/14/2019   Venous insufficiency (chronic) (peripheral) 12/08/2018   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 10/27/2018   Mild cognitive impairment with memory loss 09/21/2018   Acute cystitis without hematuria 09/09/2018   Heme positive stool 03/28/2018   S/P carotid endarterectomy 12/27/2017   Chronic constipation 10/18/2017   History of colon polyps 10/18/2017   Stenosis of lateral recess of lumbar spine 03/22/2017   Acute recurrent sinusitis 03/02/2017   Anemia, unspecified 01/31/2016   Knee pain, left 08/05/2015   Allergic rhinitis 07/15/2015   Stenosis of carotid artery 07/15/2015   PVD (peripheral vascular disease) (HCC) 07/15/2015   Renal artery stenosis (HCC) 07/15/2015   Bilateral carotid artery stenosis 07/15/2015   Arterial vascular disease 06/13/2015   Essential (primary) hypertension 06/13/2015   HLD (hyperlipidemia) 06/13/2015   Urgency of micturation 05/03/2015   Urination frequency 05/03/2015   Pedal edema 12/04/2014   Dysesthesia 12/04/2014   Nuclear sclerotic cataract 09/20/2014   Lumbar radiculopathy 08/16/2014   Spondylolisthesis at L5-S1 level 08/07/2014   Neck pain 08/07/2014   Chronic rheumatic arthritis (HCC) 08/07/2014   Rheumatoid arthritis (HCC) 08/07/2014   Angina pectoris (HCC) 08/03/2014   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     Family History  Problem Relation Age of Onset   Heart disease Mother    Heart disease Father    Allergic rhinitis Sister    Asthma Neg Hx    Eczema Neg Hx    Urticaria Neg Hx    Immunodeficiency Neg Hx    Angioedema Neg Hx     Social History   Tobacco Use   Smoking status: Former    Packs/day: 0.50    Years: 25.00    Pack years: 12.50    Types: Cigarettes    Quit date: 2000    Years since quitting: 22.6   Smokeless tobacco: Never  Vaping Use   Vaping Use:  Never used  Substance Use Topics   Alcohol use: No    Alcohol/week: 0.0 standard drinks   Drug use: No    Home Medications Prior to Admission medications   Medication Sig Start Date End Date Taking? Authorizing Provider  diazepam (VALIUM) 5 MG tablet Take 0.5 tablets (2.5 mg total) by mouth every 12 (twelve) hours as needed (dizziness). 01/13/21  Yes Pricilla Loveless, MD  ascorbic acid (VITAMIN C) 100 MG tablet Take by mouth.    [provider]  clopidogrel (PLAVIX) 75 MG tablet Take 75 mg by mouth daily.    [provider]  donepezil (ARICEPT) 10 MG tablet Take 0.5 tablets (5 mg total) by mouth at bedtime. 01/01/21   Sater, Pearletha Furl, MD  meclizine (ANTIVERT) 25 MG tablet Take 1 tablet (25 mg total) by mouth 3 (three) times daily as needed for dizziness. 01/01/21   Sater, Pearletha Furl, MD  NITROSTAT 0.4 MG SL tablet Place 0.4 mg under the tongue every 5 (five) minutes as needed for chest pain.  07/08/14   [provider]  Triamcinolone Acetonide (NASACORT AQ NA) Place into the nose.    [provider]    Allergies    Other, Lamotrigine, Latex, and Nickel  Review of Systems   Review of Systems  Eyes:  Positive for visual disturbance (blurry vision).  Neurological:  Positive for dizziness and headaches. Negative for weakness and numbness.  All other systems reviewed and are negative.  Physical Exam Updated Vital Signs BP (!) 178/89 (BP Location: Right Arm)   Pulse 86   Temp 98.1 F (36.7 C) (Oral)   Resp 18   SpO2 100%   Physical Exam Vitals and nursing note reviewed.  Constitutional:      Appearance: She is well-developed.  HENT:     Head: Normocephalic and atraumatic.     Right Ear: External ear normal.     Left Ear: External ear normal.     Nose: Nose normal.  Eyes:     General:        Right eye: No discharge.        Left eye: No discharge.     Extraocular Movements: Extraocular movements intact.     Pupils: Pupils are equal, round, and  reactive to light.     Comments: No nystagmus currently at rest  Cardiovascular:     Rate and Rhythm: Normal rate and regular rhythm.     Heart sounds: Normal heart sounds.  Pulmonary:     Effort: Pulmonary effort is normal.     Breath sounds: Normal breath sounds.  Abdominal:     Palpations: Abdomen is soft.     Tenderness: There is no abdominal tenderness.  Skin:    General:  Skin is warm and dry.  Neurological:     Mental Status: She is alert.     Comments: CN 3-12 grossly intact. 5/5 strength in all 4 extremities. Grossly normal sensation. Normal finger to nose. Is unsteady when standing up and has to hold on to me to walk  Psychiatric:        Mood and Affect: Mood is not anxious.    ED Results / Procedures / Treatments   Labs (all labs ordered are listed, but only abnormal results are displayed) Labs Reviewed - No data to display  EKG None  Radiology No results found.  Procedures Procedures   Medications Ordered in ED Medications  diazepam (VALIUM) tablet 2.5 mg (has no administration in time range)    ED Course  I have reviewed the triage vital signs and the nursing notes.  Pertinent labs & imaging results that were available during my care of the patient were reviewed by me and considered in my medical decision making (see chart for details).    MDM Rules/Calculators/A&P                           Is unclear why the patient was sent down for what appears to be nystagmus based on the patient report.  This would go along with her vertigo for several weeks.  She is having trouble walking but is able to walk if she is holding onto something.  Offered her many options including repeat head CT and some blood work here but I do not have an MRI here and she does not want to be admitted or be transferred but would rather wait for the outpatient work-up which I think is very reasonable.  I will try some low-dose Valium to see if this gives her better relief as she would  like to try something else for her symptoms.  Otherwise, it appears she is stable for discharge home to follow-up as well as return if symptoms worsen. Final Clinical Impression(s) / ED Diagnoses Final diagnoses:  Vertigo    Rx / DC Orders ED Discharge Orders          Ordered    diazepam (VALIUM) 5 MG tablet  Every 12 hours PRN        01/13/21 1248             Pricilla Loveless, MD 01/13/21 1252

## 2021-01-13 NOTE — Therapy (Addendum)
[facility] [address]  [address] [place], [place], [address] Phone: [phone]   Fax:  [phone]  Physical Therapy Evaluation  Patient Details  Name: Sara Villanueva MRN: [id number] Date of Birth: [birth year] Referring Provider (PT): Despina Arias   Encounter Date: 01/13/2021   PT End of Session - 01/13/21 1144     Visit Number 1    Number of Visits 12    Date for PT Re-Evaluation 02/24/21    Authorization Type MCR + Federal BCBS    Progress Note Due on Visit 10    PT Start Time 1015    PT Stop Time 1115    PT Time Calculation (min) 60 min    Activity Tolerance Other (comment)   patient reported worsened dizziness, not returning to baseline   Behavior During Therapy [facility] for tasks assessed/performed             Past Medical History:  Diagnosis Date   Arthritis    Bladder disorder    CAD (coronary artery disease)    stents x 3, 2013, 2017   Carotid arterial disease (HCC)    CHF (congestive heart failure) (HCC)    Colon polyps    Eczema    High cholesterol    Hypertension    Lower GI bleed    Rheumatoid arteritis (HCC)    Rheumatoid arthritis (HCC)     Past Surgical History:  Procedure Laterality Date   CAROTID ENDARTERECTOMY     CORONARY ANGIOPLASTY WITH STENT PLACEMENT      Vitals:      Subjective Assessment - 01/13/21 1011     Subjective Patient reports she has had dizziness since April.  Pt. was seen at ED on 12/05/20 for worsening.  Has history of dizziness treated with antivert in the past.  ED felt it was BPPV, treated with antivert and to follow up with PCP.  PCP referred to ENT about continued dizziness, who ordered brain CT which was negative.  Has taken her Meclizine today.  Her husband died unexpectently about 3 months ago.   Today is really bad, but daughter was able to driver her.    Pertinent History history of dizziness, carotid artery disease, HTN, HLD, on plavix    Limitations  Standing;House hold activities    How long can you sit comfortably? no limitations    How long can you stand comfortably? limited by dizziness    How long can you walk comfortably? no limitation.    Diagnostic tests CT Brain 12/19/20- IMPRESSION:   1. Stable mild atrophy and white matter disease. This likely   reflects the sequela of chronic microvascular ischemia.   2. No acute intracranial abnormality or significant interval change    Patient Stated Goals get rid of dizziness.    Currently in Pain? 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  Yes    Is the patient reluctant to leave their home because of a fear of falling?  Yes      Home Environment   Living Environment Private residence    Living Arrangements Alone   daughter staying with her due to vertigo   Available Help at Discharge Family    Type of Home House    Home Access Stairs to enter    Entrance Stairs-Number of Steps 7    Entrance Stairs-Rails Right    Home Layout Two level;Able to live on main level with bedroom/bathroom    Home Equipment None      Prior Function   Level of Independence Independent    Vocation Retired    Leisure shopping      Cognition   Overall Cognitive Status History of cognitive impairments - at baseline      Coordination   Gross Motor Movements are Fluid and Coordinated Yes                    Vestibular Assessment - 01/13/21 0001       Vestibular Assessment   General Observation --   Patient very unsteady with gait, no AD, no apparent distress, difficulty  following more than 1 step commands.     Symptom Behavior   Subjective history of current problem Pt. reports ongoing history of dizziness with positional movements, but today "worst its ever been" since she woke up, dizzines is constant.    Type of Dizziness  Lightheadedness;Imbalance;Spinning    Frequency of Dizziness constant    Duration of Dizziness --   usually gets better quickly but today is worse.   Symptom Nature Motion provoked;Variable;Constant    Aggravating Factors Sit to stand;Turning head quickly;Turning head sideways    Relieving Factors Medication    Progression of Symptoms Worse    History of similar episodes yes      Oculomotor Exam   Ocular ROM --   decreased   Spontaneous Absent    Gaze-induced  Absent    Head shaking Horizontal Absent    Head Shaking Vertical --   increased dizziness.   Smooth Pursuits Saccades    Saccades Hypometric      Positional Testing   Dix-Hallpike Dix-Hallpike Right;Dix-Hallpike Left    Horizontal Canal Testing Horizontal Canal Right;Horizontal Canal Left      Dix-Hallpike Right   Dix-Hallpike Right Duration negative    Dix-Hallpike Right Symptoms No nystagmus;Other (comment)   dizzy     Dix-Hallpike Left   Dix-Hallpike Left Duration negative    Dix-Hallpike Left Symptoms No nystagmus;Other (comment)   dizzy     Horizontal Canal Right   Horizontal Canal Right Duration 10 sec   constant dizziness   Horizontal Canal Right Symptoms Geotrophic      Horizontal Canal Left   Horizontal Canal Left Duration --   constant dizziness.   Horizontal Canal Left Symptoms Normal      Positional Sensitivities   Sit to Supine Severe dizziness    Supine to Left Side Severe dizziness    Supine to Right Side Severe dizziness    Supine to Sitting Severe dizziness    Right Hallpike Severe dizziness    Up from Right Hallpike Severe dizziness    Up from Left Hallpike Severe dizziness    Rolling Right Severe dizziness    Rolling Left Severe  dizziness    Positional Sensitivities Comments --   constant, worsened with all movements  Objective measurements completed on examination: See above findings.                 PT Short Term Goals - 01/13/21 1159       PT SHORT TERM GOAL #1   Title Patient will complete DHI within 3 visits    Time 2    Period Weeks    Status New    Target Date 01/27/21               PT Long Term Goals - 01/13/21 1200       PT LONG TERM GOAL #1   Title Pt. will be independent with progressive HEP to improve outcomes.    Time 6    Period Weeks    Status New    Target Date 02/24/21      PT LONG TERM GOAL #2   Title Pt. will report 50% improvement in dizziness symptoms.    Time 6    Period Weeks    Status New    Target Date 02/24/21      PT LONG TERM GOAL #3   Title Pt. will complete BERG with goal to be deteremined to decrease fall risk.    Time 6    Period Weeks    Status New    Target Date 02/24/21      PT LONG TERM GOAL #4   Title Pt. will be able to transition from sitting to standing without dizziness or loss of balance.    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  CT did not demonstrate any new concerns, but MRI was ordered to be done on Saturday.   She has been seen by ENT and Neurology.  On arrival today she reported significant worsening of symptoms, with constant dizziness with all positional changes.  Noted today unable to fix gaze and increased saccades when following object all directions, increasing dizziness.  Negative Gilberto Better for posterior canalithiasis, but did not brief geotropic nystagmus with roll test to right, however Gufoni's maneuver  increased dizziness significantly, and patient did not feel she could stand or walk.  Due to sudden increase in severity of symptoms, decided to refer patient to ED to rule out CVA.  Otherwise, symptoms consistent with central vertigo, and patient would benefit from gaze stabilization exercises to decrease vertigo, once medically cleared.    Personal Factors and Comorbidities Age;Comorbidity 3+    Comorbidities history of vertigo, HTN, HLD, carotid artery disease, mild cognitive impairment, RA,    Examination-Activity Limitations Bend;Stand;Transfers    Examination-Participation Restrictions Driving;Community Activity;Shop    Stability/Clinical Decision Making Unstable/Unpredictable    Clinical Decision Making High    Rehab Potential Fair    PT Frequency 2x / week    PT Duration 6 weeks    PT Treatment/Interventions Canalith Repostioning;Stair training;Functional mobility training;Therapeutic activities;Gait training;Therapeutic exercise;Balance training;Neuromuscular re-education;Passive range of motion;Vestibular;Visual/perceptual remediation/compensation;Joint Manipulations;Manual techniques    PT Next Visit Plan complete DHI, Berg, VOR exercises    Consulted and Agree with Plan of Care Patient  Patient will benefit from skilled therapeutic intervention in order to improve the following deficits and impairments:  Dizziness, Abnormal gait, Decreased balance, Decreased safety awareness, Difficulty walking, Postural dysfunction, Impaired perceived functional ability  Visit Diagnosis: Dizziness and giddiness  Abnormal posture  Unsteady gait     Problem List Patient Active Problem List   Diagnosis Date Noted   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   Chronic systolic congestive heart failure (HCC) 05/12/2019   Memory loss 03/21/2019   Near syncope 02/25/2019   UTI due to extended-spectrum beta lactamase (ESBL) producing Escherichia coli 02/25/2019   Urinary retention  with incomplete bladder emptying 02/25/2019   Voiding dysfunction 02/14/2019   Venous insufficiency (chronic) (peripheral) 12/08/2018   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 10/27/2018   Mild cognitive impairment with memory loss 09/21/2018   Acute cystitis without hematuria 09/09/2018   Heme positive stool 03/28/2018   S/P carotid endarterectomy 12/27/2017   Chronic constipation 10/18/2017   History of colon polyps 10/18/2017   Stenosis of lateral recess of lumbar spine 03/22/2017   Acute recurrent sinusitis 03/02/2017   Anemia, unspecified 01/31/2016   Knee pain, left 08/05/2015   Allergic rhinitis 07/15/2015   Stenosis of carotid artery 07/15/2015   PVD (peripheral vascular disease) (HCC) 07/15/2015   Renal artery stenosis (HCC) 07/15/2015   Bilateral carotid artery stenosis 07/15/2015   Arterial vascular disease 06/13/2015   Essential (primary) hypertension 06/13/2015   HLD (hyperlipidemia) 06/13/2015   Urgency of micturation 05/03/2015   Urination frequency 05/03/2015   Pedal edema 12/04/2014   Dysesthesia 12/04/2014   Nuclear sclerotic cataract 09/20/2014   Lumbar radiculopathy 08/16/2014   Spondylolisthesis at L5-S1 level 08/07/2014   Neck pain 08/07/2014   Chronic rheumatic arthritis (HCC) 08/07/2014   Rheumatoid arthritis (HCC) 08/07/2014   Angina pectoris (HCC) 08/03/2014   Aseptic bony necrosis (HCC) 08/03/2014   Aseptic necrosis of bone (HCC) 08/03/2014    Jena Gauss PT, DPT   01/13/2021, 12:09 PM  [facility] [address]  [address] [place], [place], [address] Phone: [phone]   Fax:  [phone]  Name: Johnda Billiot MRN: [id number] Date of Birth: [birth year]

## 2021-01-18 ENCOUNTER — Ambulatory Visit
Admission: RE | Admit: 2021-01-18 | Discharge: 2021-01-18 | Disposition: A | Discharge status: Home / Self Care | Payer: Medicare Other | Source: Ambulatory Visit | Attending: Neurology | Admitting: Neurology

## 2021-01-18 DIAGNOSIS — H814 Vertigo of central origin: Secondary | ICD-10-CM | POA: Diagnosis not present

## 2021-01-18 MED ORDER — GADOBENATE DIMEGLUMINE 529 MG/ML IV SOLN
11.0000 mL | Freq: Once | INTRAVENOUS | Status: AC | PRN
  Administered 2021-01-18: 11 mL via INTRAVENOUS

## 2021-01-21 ENCOUNTER — Telehealth: Payer: Self-pay | Admitting: Neurology

## 2021-01-21 NOTE — Telephone Encounter (Signed)
-----   Message from Asa Lente, MD sent at 01/21/2021  9:50 AM EDT ----- Please let her know the MRI looked ok  --- shows age related changes but nothing new compared to last MRI.   THe inner ears looked fine.

## 2021-01-21 NOTE — Telephone Encounter (Signed)
Called the patient and reviewed the MRI results with her. Advised that overall looked good and only showed age related changes which had not changed compared to the last MRI. Pt verbalized understanding. Pt had no questions at this time but was encouraged to call back if questions arise.

## 2021-01-22 ENCOUNTER — Ambulatory Visit: Payer: Medicare Other | Admitting: Physical Therapy

## 2021-01-28 ENCOUNTER — Ambulatory Visit (INDEPENDENT_AMBULATORY_CARE_PROVIDER_SITE_OTHER): Payer: Medicare Other | Admitting: Rehabilitative and Restorative Service Providers"

## 2021-01-28 ENCOUNTER — Other Ambulatory Visit: Payer: Self-pay

## 2021-01-28 DIAGNOSIS — R2681 Unsteadiness on feet: Secondary | ICD-10-CM | POA: Diagnosis not present

## 2021-01-28 DIAGNOSIS — R293 Abnormal posture: Secondary | ICD-10-CM | POA: Diagnosis not present

## 2021-01-28 DIAGNOSIS — R42 Dizziness and giddiness: Secondary | ICD-10-CM

## 2021-01-28 NOTE — Patient Instructions (Signed)
Access Code: [email or web address] URL: [email or web address] Date: 01/28/2021 Prepared by: Margretta Ditty  Exercises Sit to Stand with Armchair - 2 x daily - 7 x weekly - 2 sets - 5 reps Wide Stance with Head Rotations and Unilateral Counter Support - 2 x daily - 7 x weekly - 1 sets - 10 reps Side Stepping with Counter Support - 2 x daily - 7 x weekly - 1 sets - 10 reps

## 2021-01-28 NOTE — Therapy (Signed)
[facility] [place] [address] [place], [place], [address] Phone: [phone]   Fax:  [phone]  Physical Therapy Treatment  Patient Details  Name: Sara Villanueva MRN: [id number] Date of Birth: 29-Dec-1945 Referring Provider (PT): Despina Arias   Encounter Date: 01/28/2021   PT End of Session - 01/28/21 1224     Visit Number 2    Number of Visits 12    Date for PT Re-Evaluation 02/24/21    Authorization Type MCR + Federal BCBS    Progress Note Due on Visit 10    PT Start Time 0933    PT Stop Time 1020    PT Time Calculation (min) 47 min             Past Medical History:  Diagnosis Date   Arthritis    Bladder disorder    CAD (coronary artery disease)    stents x 3, 2013, 2017   Carotid arterial disease (HCC)    CHF (congestive heart failure) (HCC)    Colon polyps    Eczema    High cholesterol    Hypertension    Lower GI bleed    Rheumatoid arteritis (HCC)    Rheumatoid arthritis (HCC)     Past Surgical History:  Procedure Laterality Date   CAROTID ENDARTERECTOMY     CORONARY ANGIOPLASTY WITH STENT PLACEMENT      There were no vitals filed for this visit.   Subjective Assessment - 01/28/21 0932     Subjective The patient reports she had vertigo in the past that would go away.  Vertigo is getting worse.  She feels like the whole room is spinning when she stands up and rolls in bed.  When she is sitting down, she has dizziness- but worse in standing.  She is not driving and not showering due to sensation of dizziness.    Pertinent History history of dizziness, carotid artery disease, HTN, HLD, on plavix    Patient Stated Goals get rid of dizziness.    Currently in Pain? 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    Referring Provider (PT) Epimenio Foot, Richard    Onset Date/Surgical Date 12/05/20       Precautions   Precautions Fall      Ambulation/Gait   Ambulation/Gait Yes    Ambulation/Gait Assistance 5: Supervision    Gait velocity 1.31 ft/sec    Gait Comments narrow base of supprt      Standardized Balance Assessment   Standardized Balance Assessment Berg Balance Test      Berg Balance Test   Sit to Stand Able to stand without using hands and stabilize independently    Standing Unsupported Able to stand safely 2 minutes    Sitting with Back Unsupported but Feet Supported on Floor or Stool Able to sit safely and securely 2 minutes    Stand to Sit Sits safely with minimal use of hands    Transfers Able to transfer safely, minor use of hands    Standing Unsupported with Eyes Closed Able to stand 10 seconds safely   needs cues to follow commands to keep eyes closed x 10 sec   Standing Unsupported with Feet Together Able to place feet together independently and stand 1 minute safely    From Standing, Reach Forward with Outstretched  Arm Can reach forward >12 cm safely (5")    From Standing Position, Pick up Object from Floor Able to pick up shoe safely and easily    From Standing Position, Turn to Look Behind Over each Shoulder Turn sideways only but maintains balance    Turn 360 Degrees Able to turn 360 degrees safely but slowly    Standing Unsupported, Alternately Place Feet on Step/Stool Able to complete 4 steps without aid or supervision    Standing Unsupported, One Foot in Front Able to plae foot ahead of the other independently and hold 30 seconds    Standing on One Leg Able to lift leg independently and hold 5-10 seconds    Total Score 47    Berg comment: 47/56 needing demo cues to help follow commands                 Vestibular Assessment - 01/28/21 0937       Vestibular Assessment   General Observation patient walks into clinic holding onto PT intermittently due to unsteadiness      Symptom Behavior   Type of Dizziness  Spinning;Unsteady with head/body  turns;Imbalance    Frequency of Dizziness constant    Symptom Nature Constant    Progression of Symptoms Worse      Oculomotor Exam   Smooth Pursuits Intact   Needed multiple attempts due to following commands, but able to complete without nystagmus.   Saccades Intact      Vestibulo-Ocular Reflex   VOR 1 Head Only (x 1 viewing) VOR at self regulated pace is less dizziness than sit<>sidelying      Positional Testing   Sidelying Test Sidelying Right;Sidelying Left      Sidelying Right   Sidelying Right Duration constant sensation of dizziness described as spinning with lying down and return to sitting    Sidelying Right Symptoms No nystagmus      Sidelying Left   Sidelying Left Duration constant sensation of dizziness described as spinning with lying down and return to sitting    Sidelying Left Symptoms No nystagmus                      [facility] Adult PT Treatment/Exercise - 01/28/21 0937       Exercises   Exercises Other Exercises    Other Exercises  sit<>stand x 5 reps, sidestepping at the countertop, and then standing with horizontal head turns x 5 reps near countertop.             Vestibular Treatment/Exercise - 01/28/21 0951       Vestibular Treatment/Exercise   Vestibular Treatment Provided Habituation;Gaze    Habituation Exercises Standing Horizontal Head Turns;Standing Vertical Head Turns    Gaze Exercises --      Standing Horizontal Head Turns   Number of Reps  5    Symptom Description  Increases dizziness      Standing Vertical Head Turns   Number of Reps  5    Symptom Description  not as significant as head turns                   PT Education - 01/28/21 1022     Education Details HEP    Person(s) Educated Patient    Methods Explanation;Demonstration;Handout    Comprehension Verbalized understanding;Returned demonstration              PT Short Term Goals - 01/13/21 1159       PT SHORT  TERM GOAL #1   Title Patient will  complete DHI within 3 visits    Time 2    Period Weeks    Status New    Target Date 01/27/21               PT Long Term Goals - 01/28/21 1224       PT LONG TERM GOAL #1   Title Pt. will be independent with progressive HEP to improve outcomes.    Time 6    Period Weeks    Status On-going      PT LONG TERM GOAL #2   Title Pt. will report 50% improvement in dizziness symptoms.    Time 6    Period Weeks    Status On-going      PT LONG TERM GOAL #3   Title Pt. will complete BERG with goal to be deteremined to decrease fall risk.  UPDATED GOAL:  Pt will improve Berg to > or equal to 52/56.    Baseline Scored 47/56 on 01/28/21.    Time 6    Period Weeks    Status Achieved      PT LONG TERM GOAL #4   Title Pt. will be able to transition from sitting to standing without dizziness or loss of balance.    Baseline loses balance    Time 6    Period Weeks    Status On-going                   Plan - 01/28/21 1225     Clinical Impression Statement The patient presents today with constant dizziness.  She had some difficulty following commands and needed multiple attempts for correct performance of oculomotor, and components of Berg.  PT emphasized improving overall mobility initiating a HEP for LE strengthening and habituation.  PT to continue working towards LTGs and will progress to patient tolerance.  *Daughter attended last 5 minutes of session to review HEP.    PT Treatment/Interventions Canalith Repostioning;Stair training;Functional mobility training;Therapeutic activities;Gait training;Therapeutic exercise;Balance training;Neuromuscular re-education;Passive range of motion;Vestibular;Visual/perceptual remediation/compensation;Joint Manipulations;Manual techniques    PT Next Visit Plan Progress HEP to patient tolerance-- meausre BP/ orthostatics, progress overall habituation and balance program.    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   Chronic systolic congestive heart failure (HCC) 05/12/2019   Memory loss 03/21/2019   Near syncope 02/25/2019   UTI due to extended-spectrum beta lactamase (ESBL) producing Escherichia coli 02/25/2019   Urinary retention with incomplete bladder emptying 02/25/2019   Voiding dysfunction 02/14/2019   Venous insufficiency (chronic) (peripheral) 12/08/2018   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 10/27/2018   Mild cognitive impairment with memory loss 09/21/2018   Acute cystitis without hematuria 09/09/2018   Heme positive stool 03/28/2018   S/P carotid endarterectomy 12/27/2017   Chronic constipation 10/18/2017   History of colon polyps 10/18/2017   Stenosis of lateral recess of lumbar spine 03/22/2017   Acute recurrent sinusitis 03/02/2017   Anemia, unspecified 01/31/2016   Knee pain, left 08/05/2015   Allergic rhinitis 07/15/2015   Stenosis of carotid artery 07/15/2015  PVD (peripheral vascular disease) (HCC) 07/15/2015   Renal artery stenosis (HCC) 07/15/2015   Bilateral carotid artery stenosis 07/15/2015   Arterial vascular disease 06/13/2015   Essential (primary) hypertension 06/13/2015   HLD (hyperlipidemia) 06/13/2015   Urgency of micturation 05/03/2015   Urination frequency 05/03/2015   Pedal edema 12/04/2014   Dysesthesia 12/04/2014   Nuclear sclerotic cataract 09/20/2014   Lumbar radiculopathy 08/16/2014   Spondylolisthesis at L5-S1 level 08/07/2014   Neck pain 08/07/2014   Chronic rheumatic arthritis (HCC) 08/07/2014   Rheumatoid arthritis (HCC) 08/07/2014   Angina pectoris (HCC) 08/03/2014   Aseptic bony necrosis (HCC) 08/03/2014    Aseptic necrosis of bone (HCC) 08/03/2014   Margretta Ditty, PT  Laron Boorman 01/28/2021, 12:28 PM  [facility] [place] [address] [place], [place], [address] Phone: [phone]   Fax:  [phone]  Name: Sara Villanueva MRN: [id number] Date of Birth: 07/10/1945

## 2021-01-31 ENCOUNTER — Ambulatory Visit (INDEPENDENT_AMBULATORY_CARE_PROVIDER_SITE_OTHER): Payer: Medicare Other | Admitting: Physical Therapy

## 2021-01-31 ENCOUNTER — Other Ambulatory Visit: Payer: Self-pay

## 2021-01-31 DIAGNOSIS — R293 Abnormal posture: Secondary | ICD-10-CM

## 2021-01-31 DIAGNOSIS — R2681 Unsteadiness on feet: Secondary | ICD-10-CM

## 2021-01-31 DIAGNOSIS — R42 Dizziness and giddiness: Secondary | ICD-10-CM | POA: Diagnosis present

## 2021-01-31 NOTE — Therapy (Signed)
[facility] [place] [address] [place], [place], [address] Phone: [phone]   Fax:  [phone]  Physical Therapy Treatment  Patient Details  Name: Sara Villanueva MRN: [id number] Date of Birth: Feb 01, 1946 Referring Provider (PT): Despina Arias   Encounter Date: 01/31/2021   PT End of Session - 01/31/21 0948     Visit Number 3    Number of Visits 12    Date for PT Re-Evaluation 02/24/21    Authorization Type MCR + [employer]    Authorization - Visit Number 3    Progress Note Due on Visit 10    PT Start Time 0910    PT Stop Time 0953    PT Time Calculation (min) 43 min    Activity Tolerance Patient tolerated treatment well    Behavior During Therapy [facility] for tasks assessed/performed             Past Medical History:  Diagnosis Date   Arthritis    Bladder disorder    CAD (coronary artery disease)    stents x 3, 2013, 2017   Carotid arterial disease (HCC)    CHF (congestive heart failure) (HCC)    Colon polyps    Eczema    High cholesterol    Hypertension    Lower GI bleed    Rheumatoid arteritis (HCC)    Rheumatoid arthritis (HCC)     Past Surgical History:  Procedure Laterality Date   CAROTID ENDARTERECTOMY     CORONARY ANGIOPLASTY WITH STENT PLACEMENT      There were no vitals filed for this visit.   Subjective Assessment - 01/31/21 0910     Subjective Pt has not had a chance to try HEP. She states she still feels dizzy "all the time"    Currently in Pain? 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    Referring Provider (PT) Epimenio Foot, Richard    Onset Date/Surgical Date 12/05/20                           [facility] Adult PT Treatment/Exercise - 01/31/21 0001       Exercises   Other Exercises  nustep x 6 minutes level 5 for overall conditioning             Vestibular  Treatment/Exercise - 01/31/21 0001       X1 Viewing Horizontal   Comments pt unable to perform this visit despite max cues      X1 Viewing Vertical   Foot Position seated    Reps 10    Comments max cues                Balance Exercises - 01/31/21 0001       Balance Exercises: Standing   Rockerboard Anterior/posterior;Lateral   1 min each   Retro Gait Upper extremity support   CGA, 1 minute   Sidestepping Upper extremity support   1 min   Other Standing Exercises tap ups 6'' step alternating LEs CGA      OTAGO PROGRAM   Knee Extensor 10 reps   2#   Hip ABductor 10 reps    Ankle Plantorflexors 20 reps, support    Ankle Dorsiflexors 20 reps, support    Knee Bends 20 reps, support  Tandem Stance 10 seconds, no support    One Leg Stand 10 seconds, no support    Sit to Stand 5 reps, one support                 PT Short Term Goals - 01/13/21 1159       PT SHORT TERM GOAL #1   Title Patient will complete DHI within 3 visits    Time 2    Period Weeks    Status New    Target Date 01/27/21               PT Long Term Goals - 01/28/21 1224       PT LONG TERM GOAL #1   Title Pt. will be independent with progressive HEP to improve outcomes.    Time 6    Period Weeks    Status On-going      PT LONG TERM GOAL #2   Title Pt. will report 50% improvement in dizziness symptoms.    Time 6    Period Weeks    Status On-going      PT LONG TERM GOAL #3   Title Pt. will complete BERG with goal to be deteremined to decrease fall risk.  UPDATED GOAL:  Pt will improve Berg to > or equal to 52/56.    Baseline Scored 47/56 on 01/28/21.    Time 6    Period Weeks    Status Achieved      PT LONG TERM GOAL #4   Title Pt. will be able to transition from sitting to standing without dizziness or loss of balance.    Baseline loses balance    Time 6    Period Weeks    Status On-going                   Plan - 01/31/21 3244     Clinical Impression  Statement Pt with requires increased cuing to follow 1 step commands this visit, max cuing for all exercises. Pt distracted by moving to a new home and the recent death of her husband. PT emphasized importance of daily mobility and exercise for strength and balance to maintain independence.    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   Chronic systolic congestive heart failure (HCC) 05/12/2019   Memory loss 03/21/2019   Near syncope 02/25/2019   UTI due to extended-spectrum beta lactamase (ESBL) producing Escherichia coli 02/25/2019   Urinary retention with incomplete bladder emptying 02/25/2019   Voiding dysfunction 02/14/2019   Venous insufficiency (chronic) (peripheral) 12/08/2018   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 10/27/2018   Mild cognitive impairment with memory loss 09/21/2018   Acute cystitis without hematuria 09/09/2018   Heme positive stool 03/28/2018   S/P carotid endarterectomy 12/27/2017   Chronic constipation 10/18/2017   History of colon polyps 10/18/2017   Stenosis of lateral recess of lumbar spine 03/22/2017   Acute recurrent sinusitis 03/02/2017  Anemia, unspecified 01/31/2016   Knee pain, left 08/05/2015   Allergic rhinitis 07/15/2015   Stenosis of carotid artery 07/15/2015   PVD (peripheral vascular disease) (HCC) 07/15/2015   Renal artery stenosis (HCC) 07/15/2015   Bilateral carotid artery stenosis 07/15/2015   Arterial vascular disease 06/13/2015   Essential (primary) hypertension  06/13/2015   HLD (hyperlipidemia) 06/13/2015   Urgency of micturation 05/03/2015   Urination frequency 05/03/2015   Pedal edema 12/04/2014   Dysesthesia 12/04/2014   Nuclear sclerotic cataract 09/20/2014   Lumbar radiculopathy 08/16/2014   Spondylolisthesis at L5-S1 level 08/07/2014   Neck pain 08/07/2014   Chronic rheumatic arthritis (HCC) 08/07/2014   Rheumatoid arthritis (HCC) 08/07/2014   Angina pectoris (HCC) 08/03/2014   Aseptic bony necrosis (HCC) 08/03/2014   Aseptic necrosis of bone (HCC) 08/03/2014   Lamyiah Crawshaw, PT  Cindia Hustead 01/31/2021, 9:51 AM  [facility] [address] [place], [place], [address] Phone: [phone]   Fax:  [phone]  Name: Sara Villanueva MRN: [id number] Date of Birth: 1945-10-05

## 2021-02-05 ENCOUNTER — Ambulatory Visit (INDEPENDENT_AMBULATORY_CARE_PROVIDER_SITE_OTHER): Payer: Medicare Other | Admitting: Physical Therapy

## 2021-02-05 ENCOUNTER — Other Ambulatory Visit: Payer: Self-pay

## 2021-02-05 DIAGNOSIS — R293 Abnormal posture: Secondary | ICD-10-CM

## 2021-02-05 DIAGNOSIS — R42 Dizziness and giddiness: Secondary | ICD-10-CM | POA: Diagnosis present

## 2021-02-05 DIAGNOSIS — R2681 Unsteadiness on feet: Secondary | ICD-10-CM | POA: Diagnosis not present

## 2021-02-05 NOTE — Therapy (Signed)
[facility] [place] [address] [place], [place], [address] Phone: [phone]   Fax:  [phone]  Physical Therapy Treatment  Patient Details  Name: Sara Villanueva MRN: [id number] Date of Birth: 22-Dec-1945 Referring Provider (PT): Despina Arias   Encounter Date: 02/05/2021   PT End of Session - 02/05/21 1058     Visit Number 4    Number of Visits 12    Date for PT Re-Evaluation 02/24/21    Authorization Type MCR + [employer]    Authorization - Visit Number 4    Progress Note Due on Visit 10    PT Start Time 1015    PT Stop Time 1058    PT Time Calculation (min) 43 min    Activity Tolerance Patient tolerated treatment well    Behavior During Therapy [facility] for tasks assessed/performed             Past Medical History:  Diagnosis Date   Arthritis    Bladder disorder    CAD (coronary artery disease)    stents x 3, 2013, 2017   Carotid arterial disease (HCC)    CHF (congestive heart failure) (HCC)    Colon polyps    Eczema    High cholesterol    Hypertension    Lower GI bleed    Rheumatoid arteritis (HCC)    Rheumatoid arthritis (HCC)     Past Surgical History:  Procedure Laterality Date   CAROTID ENDARTERECTOMY     CORONARY ANGIOPLASTY WITH STENT PLACEMENT      There were no vitals filed for this visit.   Subjective Assessment - 02/05/21 1022     Subjective Pt states the dizziness is still there all the time. She is having increased eczema    Patient Stated Goals get rid of dizziness.    Currently in Pain? 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    Referring Provider (PT) Epimenio Foot, Richard    Onset Date/Surgical Date 12/05/20      Ambulation/Gait   Ambulation/Gait Assistance 6: Modified independent (Device/Increase time)    Assistive device None                           [facility] Adult  PT Treatment/Exercise - 02/05/21 0001       Exercises   Other Exercises  nustep x 6 minutes level 5 for overall conditioning             Vestibular Treatment/Exercise - 02/05/21 0001       X1 Viewing Horizontal   Foot Position seated    Time 0100   1 minute   Comments min/mod cuing      X1 Viewing Vertical   Foot Position seated    Time 0100   1 minute   Comments improved ability to perform, min/mod cuing required                Balance Exercises - 02/05/21 0001       Balance Exercises: Standing   Standing, One Foot on a Step Eyes open;Trunk rotation;8 inch   min A   Retro Gait Upper extremity support   CGA, 1 minute   Sidestepping Upper extremity support   1 min   Step Over Hurdles / Cones stepping over  pool noodles laterally and A/P with min cuing for alternating steps, CGA for balance    Other Standing Exercises tap ups 8'' step alternating LE x 10 CGA    Other Standing Exercises Comments standing narrow BOS with head turns 2 x 30 sec, tandem stance with head turns 2 x 30sec             Upper Extremity Functional Index Score :   /80     PT Short Term Goals - 01/13/21 1159       PT SHORT TERM GOAL #1   Title Patient will complete DHI within 3 visits    Time 2    Period Weeks    Status New    Target Date 01/27/21               PT Long Term Goals - 01/28/21 1224       PT LONG TERM GOAL #1   Title Pt. will be independent with progressive HEP to improve outcomes.    Time 6    Period Weeks    Status On-going      PT LONG TERM GOAL #2   Title Pt. will report 50% improvement in dizziness symptoms.    Time 6    Period Weeks    Status On-going      PT LONG TERM GOAL #3   Title Pt. will complete BERG with goal to be deteremined to decrease fall risk.  UPDATED GOAL:  Pt will improve Berg to > or equal to 52/56.    Baseline Scored 47/56 on 01/28/21.    Time 6    Period Weeks    Status Achieved      PT LONG TERM GOAL #4   Title Pt. will  be able to transition from sitting to standing without dizziness or loss of balance.    Baseline loses balance    Time 6    Period Weeks    Status On-going                   Plan - 02/05/21 1059     Clinical Impression Statement Pt better able to follow cues to perform VOR x1 exercises today. States dizziness increases with activity but then goes back to baseline with rest. 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   Chronic systolic congestive heart failure (HCC) 05/12/2019   Memory loss 03/21/2019   Near syncope 02/25/2019   UTI due to extended-spectrum beta lactamase (ESBL) producing Escherichia coli 02/25/2019   Urinary retention with incomplete bladder emptying 02/25/2019   Voiding dysfunction 02/14/2019   Venous insufficiency (chronic) (peripheral) 12/08/2018   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 10/27/2018   Mild cognitive impairment with memory loss 09/21/2018   Acute cystitis without hematuria 09/09/2018   Heme positive stool 03/28/2018   S/P carotid endarterectomy  12/27/2017   Chronic constipation 10/18/2017   History of colon polyps 10/18/2017   Stenosis of lateral recess of lumbar spine 03/22/2017   Acute recurrent sinusitis 03/02/2017   Anemia, unspecified 01/31/2016   Knee pain, left 08/05/2015   Allergic rhinitis 07/15/2015   Stenosis of carotid artery 07/15/2015   PVD (peripheral  vascular disease) (HCC) 07/15/2015   Renal artery stenosis (HCC) 07/15/2015   Bilateral carotid artery stenosis 07/15/2015   Arterial vascular disease 06/13/2015   Essential (primary) hypertension 06/13/2015   HLD (hyperlipidemia) 06/13/2015   Urgency of micturation 05/03/2015   Urination frequency 05/03/2015   Pedal edema 12/04/2014   Dysesthesia 12/04/2014   Nuclear sclerotic cataract 09/20/2014   Lumbar radiculopathy 08/16/2014   Spondylolisthesis at L5-S1 level 08/07/2014   Neck pain 08/07/2014   Chronic rheumatic arthritis (HCC) 08/07/2014   Rheumatoid arthritis (HCC) 08/07/2014   Angina pectoris (HCC) 08/03/2014   Aseptic bony necrosis (HCC) 08/03/2014   Aseptic necrosis of bone (HCC) 08/03/2014   Angalina Ante, PT  Reyana Leisey, PT 02/05/2021, 11:01 AM  [facility] [address] [place], [place], [address] Phone: [phone]   Fax:  [phone]  Name: Vallory Pilgrim MRN: [id number] Date of Birth: Dec 08, 1945

## 2021-02-07 ENCOUNTER — Ambulatory Visit (INDEPENDENT_AMBULATORY_CARE_PROVIDER_SITE_OTHER): Payer: Medicare Other | Admitting: Rehabilitative and Restorative Service Providers"

## 2021-02-07 ENCOUNTER — Encounter: Payer: Self-pay | Admitting: Rehabilitative and Restorative Service Providers"

## 2021-02-07 ENCOUNTER — Other Ambulatory Visit: Payer: Self-pay

## 2021-02-07 DIAGNOSIS — R2681 Unsteadiness on feet: Secondary | ICD-10-CM | POA: Diagnosis not present

## 2021-02-07 DIAGNOSIS — R42 Dizziness and giddiness: Secondary | ICD-10-CM

## 2021-02-07 DIAGNOSIS — R293 Abnormal posture: Secondary | ICD-10-CM | POA: Diagnosis not present

## 2021-02-07 NOTE — Therapy (Signed)
[facility] [place] [address] [place], [place], [address] Phone: [phone]   Fax:  [phone]  Physical Therapy Treatment  Patient Details  Name: Sara Villanueva MRN: [id number] Date of Birth: [birth year] Referring Provider (PT): Despina Arias   Encounter Date: 02/07/2021   PT End of Session - 02/07/21 1320     Visit Number 5    Number of Visits 12    Date for PT Re-Evaluation 02/24/21    Authorization Type MCR + [employer]    Authorization - Visit Number 5    Progress Note Due on Visit 10    PT Start Time 1314    PT Stop Time 1355    PT Time Calculation (min) 41 min    Activity Tolerance Patient tolerated treatment well    Behavior During Therapy [facility] for tasks assessed/performed             Past Medical History:  Diagnosis Date   Arthritis    Bladder disorder    CAD (coronary artery disease)    stents x 3, 2013, 2017   Carotid arterial disease (HCC)    CHF (congestive heart failure) (HCC)    Colon polyps    Eczema    High cholesterol    Hypertension    Lower GI bleed    Rheumatoid arteritis (HCC)    Rheumatoid arthritis (HCC)     Past Surgical History:  Procedure Laterality Date   CAROTID ENDARTERECTOMY     CORONARY ANGIOPLASTY WITH STENT PLACEMENT      There were no vitals filed for this visit.   Subjective Assessment - 02/07/21 1319     Subjective The patient was able to walk around [employer] for 15 minutes without dizziness yesterday.  She does not note any dizziness this afternoon.    Pertinent History history of dizziness, carotid artery disease, HTN, HLD, on plavix    Patient Stated Goals get rid of dizziness.    Currently in Pain? 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  Performed standing with head turns spotting objects around the clinic.      Exercises   Exercises Other Exercises    Other Exercises  nustep x 4.5 minutes LEs; sit<>stand x 10 reps             Vestibular Treatment/Exercise - 02/07/21 1330       Vestibular Treatment/Exercise   Vestibular Treatment Provided Habituation;Gaze    Habituation Exercises Standing Horizontal Head Turns;Standing Vertical Head Turns;Brandt Daroff    Gaze Exercises X1 Viewing Horizontal;X1 Viewing Vertical      Austin Miles   Number of Reps  4    Symptom Description  some lightheadedness from R side to sitting  Standing Horizontal Head Turns   Number of Reps  5      Standing Vertical Head Turns   Number of Reps  5      X1 Viewing Horizontal   Foot Position seated    Comments with minimal cues      X1 Viewing Vertical   Foot Position seated    Comments wiht minimal cues                    PT Education - 02/07/21 1328     Education Details HEP    Person(s) Educated Patient    Methods Explanation;Handout    Comprehension Verbalized understanding;Returned demonstration              PT Short Term Goals - 02/07/21 1328       PT SHORT TERM GOAL #1   Title n/a               PT Long Term Goals - 01/28/21 1224       PT LONG TERM GOAL #1   Title Pt. will be independent with progressive HEP to improve outcomes.    Time 6    Period Weeks    Status On-going      PT LONG TERM GOAL #2   Title Pt. will report 50% improvement in dizziness symptoms.    Time 6    Period Weeks    Status On-going      PT LONG TERM  GOAL #3   Title Pt. will complete BERG with goal to be deteremined to decrease fall risk.  UPDATED GOAL:  Pt will improve Berg to > or equal to 52/56.    Baseline Scored 47/56 on 01/28/21.    Time 6    Period Weeks    Status Achieved      PT LONG TERM GOAL #4   Title Pt. will be able to transition from sitting to standing without dizziness or loss of balance.    Baseline loses balance    Time 6    Period Weeks    Status On-going                   Plan - 02/07/21 1647     Clinical Impression Statement The patient notes reduction in dizziness.  She tolerated functional activities well today doing turns with gait, direction changes, and bending to pick up objects.  PT continues to progress HEP and working to [employer].    PT Treatment/Interventions Canalith Repostioning;Stair training;Functional mobility training;Therapeutic activities;Gait training;Therapeutic exercise;Balance training;Neuromuscular re-education;Passive range of motion;Vestibular;Visual/perceptual remediation/compensation;Joint Manipulations;Manual techniques    PT Next Visit Plan progress HEP, add further balance, LE strengthening, habituation activities    PT Home Exercise Plan [id number]    Consulted and Agree with Plan of Care Patient             Patient will benefit from skilled therapeutic intervention in order to improve the following deficits and impairments:     Visit Diagnosis: Dizziness and giddiness  Abnormal posture  Unsteady gait     Problem List Patient Active Problem List   Diagnosis Date Noted   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   Chronic systolic congestive heart failure (HCC) 05/12/2019   Memory loss 03/21/2019   Near syncope 02/25/2019   UTI due to extended-spectrum beta lactamase (ESBL) producing Escherichia coli 02/25/2019   Urinary retention with incomplete bladder emptying 02/25/2019   Voiding dysfunction 02/14/2019   Venous insufficiency (chronic) (peripheral) 12/08/2018  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 10/27/2018   Mild cognitive impairment with memory loss 09/21/2018   Acute cystitis without hematuria 09/09/2018   Heme positive stool 03/28/2018   S/P carotid endarterectomy 12/27/2017   Chronic constipation 10/18/2017   History of colon polyps 10/18/2017   Stenosis of lateral recess of lumbar spine 03/22/2017   Acute recurrent sinusitis 03/02/2017   Anemia, unspecified 01/31/2016   Knee pain, left 08/05/2015   Allergic rhinitis 07/15/2015   Stenosis of carotid artery 07/15/2015   PVD (peripheral vascular disease) (HCC) 07/15/2015   Renal artery stenosis (HCC) 07/15/2015   Bilateral carotid artery stenosis 07/15/2015   Arterial vascular disease 06/13/2015   Essential (primary) hypertension 06/13/2015   HLD (hyperlipidemia) 06/13/2015   Urgency of micturation 05/03/2015   Urination frequency 05/03/2015   Pedal edema 12/04/2014   Dysesthesia 12/04/2014   Nuclear sclerotic cataract 09/20/2014   Lumbar radiculopathy 08/16/2014   Spondylolisthesis at L5-S1 level 08/07/2014   Neck pain 08/07/2014   Chronic rheumatic arthritis (HCC) 08/07/2014   Rheumatoid arthritis (HCC) 08/07/2014   Angina pectoris (HCC) 08/03/2014   Aseptic bony necrosis (HCC) 08/03/2014   Aseptic necrosis of bone (HCC) 08/03/2014    Jessiah Wojnar, PT 02/07/2021, 4:50 PM  [facility] [address] [place], [place], [address] Phone: [phone]   Fax:  [phone]  Name: Sara Villanueva MRN: [id number] Date of Birth: 02-26-1946

## 2021-02-11 ENCOUNTER — Other Ambulatory Visit: Payer: Self-pay

## 2021-02-11 ENCOUNTER — Ambulatory Visit (INDEPENDENT_AMBULATORY_CARE_PROVIDER_SITE_OTHER): Payer: Medicare Other | Admitting: Physical Therapy

## 2021-02-11 DIAGNOSIS — R2681 Unsteadiness on feet: Secondary | ICD-10-CM

## 2021-02-11 DIAGNOSIS — R293 Abnormal posture: Secondary | ICD-10-CM | POA: Diagnosis not present

## 2021-02-11 DIAGNOSIS — R42 Dizziness and giddiness: Secondary | ICD-10-CM | POA: Diagnosis not present

## 2021-02-11 NOTE — Therapy (Signed)
[facility] [address] [place], [place], [address] Phone: [phone]   Fax:  [phone]  Physical Therapy Treatment  Patient Details  Name: Sara Villanueva MRN: [id number] Date of Birth: May 20, 1946 Referring Provider (PT): Despina Arias   Encounter Date: 02/11/2021   PT End of Session - 02/11/21 1108     Visit Number 6    Number of Visits 12    Date for PT Re-Evaluation 02/24/21    Authorization Type MCR + [employer]    Authorization - Visit Number 6    Progress Note Due on Visit 10    PT Start Time 1015    PT Stop Time 1058    PT Time Calculation (min) 43 min    Activity Tolerance Patient tolerated treatment well    Behavior During Therapy [facility] for tasks assessed/performed             Past Medical History:  Diagnosis Date   Arthritis    Bladder disorder    CAD (coronary artery disease)    stents x 3, 2013, 2017   Carotid arterial disease (HCC)    CHF (congestive heart failure) (HCC)    Colon polyps    Eczema    High cholesterol    Hypertension    Lower GI bleed    Rheumatoid arteritis (HCC)    Rheumatoid arthritis (HCC)     Past Surgical History:  Procedure Laterality Date   CAROTID ENDARTERECTOMY     CORONARY ANGIOPLASTY WITH STENT PLACEMENT      There were no vitals filed for this visit.   Subjective Assessment - 02/11/21 1021     Subjective Pt reports she is having an "eczema flare up" and is feeling "not good" today. She states dizziness is unchanged    Patient Stated Goals get rid of dizziness.    Currently in Pain? 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    Referring Provider (PT) Epimenio Foot, Richard    Onset Date/Surgical Date 12/05/20      Ambulation/Gait   Ambulation/Gait Assistance 6: Modified independent (Device/Increase time)    Assistive device None                  Vestibular Assessment - 02/11/21 0001       Orthostatics   BP sitting 133/89    HR sitting 119    BP standing (after 1 minute) 124/82    HR standing (after 1 minute) 124                      [facility] Adult PT Treatment/Exercise - 02/11/21 0001       Ambulation/Gait   Gait Comments gait with reaching to touch and place cones on the floor with increase in dizziness, resolves with seated rest, gait with tight turns, figure 8s, gait with cone tap with CGA      Exercises   Other Exercises  nustep x 5 mins level 5             Vestibular Treatment/Exercise - 02/11/21 0001       Standing Horizontal Head Turns   Number of Reps  10      Standing Vertical Head Turns   Number of Reps  10      X1 Viewing Horizontal  Foot Position seated    Reps 10    Comments min cues      X1 Viewing Vertical   Foot Position seated    Reps 10    Comments min cues                    PT Education - 02/11/21 1047     Education Details updated HEP    Person(s) Educated Patient    Methods Explanation;Demonstration;Handout    Comprehension Verbalized understanding;Returned demonstration              PT Short Term Goals - 02/07/21 1328       PT SHORT TERM GOAL #1   Title n/a               PT Long Term Goals - 01/28/21 1224       PT LONG TERM GOAL #1   Title Pt. will be independent with progressive HEP to improve outcomes.    Time 6    Period Weeks    Status On-going      PT LONG TERM GOAL #2   Title Pt. will report 50% improvement in dizziness symptoms.    Time 6    Period Weeks    Status On-going      PT LONG TERM GOAL #3   Title Pt. will complete BERG with goal to be deteremined to decrease fall risk.  UPDATED GOAL:  Pt will improve Berg to > or equal to 52/56.    Baseline Scored 47/56 on 01/28/21.    Time 6    Period Weeks    Status Achieved      PT LONG TERM GOAL #4   Title Pt. will be able to transition from sitting to standing without  dizziness or loss of balance.    Baseline loses balance    Time 6    Period Weeks    Status On-going                   Plan - 02/11/21 1131     Clinical Impression Statement Pt is improving tolerance to VOR exercises and with bending to pick up cones. Pt with some drop in BP with sit to stand. 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   Chronic systolic congestive heart failure (HCC) 05/12/2019   Memory loss 03/21/2019   Near syncope 02/25/2019   UTI due to extended-spectrum beta lactamase (ESBL) producing Escherichia coli 02/25/2019   Urinary retention with incomplete bladder emptying 02/25/2019   Voiding dysfunction 02/14/2019   Venous insufficiency (chronic) (peripheral) 12/08/2018   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 10/27/2018   Mild cognitive impairment with memory loss 09/21/2018   Acute cystitis without hematuria 09/09/2018   Heme positive stool 03/28/2018   S/P carotid endarterectomy 12/27/2017   Chronic constipation 10/18/2017   History of colon polyps  10/18/2017   Stenosis of lateral recess of lumbar spine 03/22/2017   Acute recurrent sinusitis 03/02/2017   Anemia, unspecified 01/31/2016   Knee pain, left 08/05/2015   Allergic rhinitis 07/15/2015   Stenosis of carotid artery 07/15/2015   PVD (peripheral vascular disease) (HCC) 07/15/2015   Renal artery stenosis (HCC) 07/15/2015   Bilateral carotid artery stenosis 07/15/2015    Arterial vascular disease 06/13/2015   Essential (primary) hypertension 06/13/2015   HLD (hyperlipidemia) 06/13/2015   Urgency of micturation 05/03/2015   Urination frequency 05/03/2015   Pedal edema 12/04/2014   Dysesthesia 12/04/2014   Nuclear sclerotic cataract 09/20/2014   Lumbar radiculopathy 08/16/2014   Spondylolisthesis at L5-S1 level 08/07/2014   Neck pain 08/07/2014   Chronic rheumatic arthritis (HCC) 08/07/2014   Rheumatoid arthritis (HCC) 08/07/2014   Angina pectoris (HCC) 08/03/2014   Aseptic bony necrosis (HCC) 08/03/2014   Aseptic necrosis of bone (HCC) 08/03/2014    Saadiya Wilfong, PT 02/11/2021, 11:44 AM  [facility] [address] [place], [place], [address] Phone: [phone]   Fax:  [phone]  Name: Sara Villanueva MRN: [id number] Date of Birth: 1945/10/18

## 2021-02-11 NOTE — Patient Instructions (Signed)
Access Code: [email or web address] URL: [email or web address] Date: 02/11/2021 Prepared by: Reggy Eye  Exercises Sit to Stand with Armchair - 2 x daily - 7 x weekly - 2 sets - 8 reps Wide Stance with Head Rotations and Unilateral Counter Support - 2 x daily - 7 x weekly - 1 sets - 10 reps Side Stepping with Counter Support - 2 x daily - 7 x weekly - 1 sets - 10 reps Brandt-Daroff Vestibular Exercise - 2 x daily - 7 x weekly - 1 sets - 3-4 reps Seated Gaze Stabilization with Head Rotation - 1 x daily - 7 x weekly - 3 sets - 10 reps Seated Gaze Stabilization with Head Nod - 1 x daily - 7 x weekly - 3 sets - 10 reps

## 2021-02-12 ENCOUNTER — Other Ambulatory Visit: Payer: Self-pay

## 2021-02-12 ENCOUNTER — Other Ambulatory Visit (HOSPITAL_BASED_OUTPATIENT_CLINIC_OR_DEPARTMENT_OTHER): Payer: Self-pay

## 2021-02-12 ENCOUNTER — Emergency Department (HOSPITAL_BASED_OUTPATIENT_CLINIC_OR_DEPARTMENT_OTHER)
Admission: EM | Admit: 2021-02-12 | Discharge: 2021-02-12 | Disposition: A | Discharge status: Home / Self Care | Payer: Medicare Other | Attending: Emergency Medicine | Admitting: Emergency Medicine

## 2021-02-12 ENCOUNTER — Encounter (HOSPITAL_BASED_OUTPATIENT_CLINIC_OR_DEPARTMENT_OTHER): Payer: Self-pay | Admitting: Emergency Medicine

## 2021-02-12 DIAGNOSIS — R413 Other amnesia: Secondary | ICD-10-CM | POA: Diagnosis not present

## 2021-02-12 DIAGNOSIS — Z955 Presence of coronary angioplasty implant and graft: Secondary | ICD-10-CM | POA: Insufficient documentation

## 2021-02-12 DIAGNOSIS — Z79899 Other long term (current) drug therapy: Secondary | ICD-10-CM | POA: Diagnosis not present

## 2021-02-12 DIAGNOSIS — R200 Anesthesia of skin: Secondary | ICD-10-CM

## 2021-02-12 DIAGNOSIS — Z87891 Personal history of nicotine dependence: Secondary | ICD-10-CM | POA: Insufficient documentation

## 2021-02-12 DIAGNOSIS — R202 Paresthesia of skin: Secondary | ICD-10-CM | POA: Diagnosis not present

## 2021-02-12 DIAGNOSIS — I5022 Chronic systolic (congestive) heart failure: Secondary | ICD-10-CM | POA: Diagnosis not present

## 2021-02-12 DIAGNOSIS — I2510 Atherosclerotic heart disease of native coronary artery without angina pectoris: Secondary | ICD-10-CM | POA: Diagnosis not present

## 2021-02-12 DIAGNOSIS — Z91040 Latex allergy status: Secondary | ICD-10-CM | POA: Insufficient documentation

## 2021-02-12 DIAGNOSIS — N3000 Acute cystitis without hematuria: Secondary | ICD-10-CM | POA: Diagnosis not present

## 2021-02-12 DIAGNOSIS — I110 Hypertensive heart disease with heart failure: Secondary | ICD-10-CM | POA: Insufficient documentation

## 2021-02-12 LAB — COMPREHENSIVE METABOLIC PANEL
ALT: 41 U/L (ref 0–44)
AST: 37 U/L (ref 15–41)
Albumin: 4 g/dL (ref 3.5–5.0)
Alkaline Phosphatase: 78 U/L (ref 38–126)
Anion gap: 8 (ref 5–15)
BUN: 16 mg/dL (ref 8–23)
CO2: 25 mmol/L (ref 22–32)
Calcium: 9.8 mg/dL (ref 8.9–10.3)
Chloride: 109 mmol/L (ref 98–111)
Creatinine, Ser: 0.8 mg/dL (ref 0.44–1.00)
GFR, Estimated: >60 mL/min (ref >60)
Glucose, Bld: 102 mg/dL — ABNORMAL HIGH (ref 70–99)
Potassium: 3.4 mmol/L — ABNORMAL LOW (ref 3.5–5.1)
Sodium: 142 mmol/L (ref 135–145)
Total Bilirubin: 0.4 mg/dL (ref 0.3–1.2)
Total Protein: 7.5 g/dL (ref 6.5–8.1)

## 2021-02-12 LAB — URINALYSIS, MICROSCOPIC (REFLEX): WBC, UA: >50 WBC/hpf (ref 0–5)

## 2021-02-12 LAB — CBC WITH DIFFERENTIAL/PLATELET
Abs Immature Granulocytes: 0.04 10*3/uL (ref 0.00–0.07)
Basophils Absolute: 0 10*3/uL (ref 0.0–0.1)
Basophils Relative: 1 %
Eosinophils Absolute: 0.1 10*3/uL (ref 0.0–0.5)
Eosinophils Relative: 1 %
HCT: 40.9 % (ref 36.0–46.0)
Hemoglobin: 13.3 g/dL (ref 12.0–15.0)
Immature Granulocytes: 1 %
Lymphocytes Relative: 15 %
Lymphs Abs: 0.9 10*3/uL (ref 0.7–4.0)
MCH: 31.3 pg (ref 26.0–34.0)
MCHC: 32.5 g/dL (ref 30.0–36.0)
MCV: 96.2 fL (ref 80.0–100.0)
Monocytes Absolute: 0.5 10*3/uL (ref 0.1–1.0)
Monocytes Relative: 8 %
Neutro Abs: 4.6 10*3/uL (ref 1.7–7.7)
Neutrophils Relative %: 74 %
Platelets: 157 10*3/uL (ref 150–400)
RBC: 4.25 MIL/uL (ref 3.87–5.11)
RDW: 13.9 % (ref 11.5–15.5)
Smear Review: NORMAL
WBC: 6.1 10*3/uL (ref 4.0–10.5)
nRBC: 0 % (ref 0.0–0.2)

## 2021-02-12 LAB — URINALYSIS, ROUTINE W REFLEX MICROSCOPIC
Bilirubin Urine: NEGATIVE
Glucose, UA: NEGATIVE mg/dL
Ketones, ur: NEGATIVE mg/dL
Nitrite: NEGATIVE
Protein, ur: NEGATIVE mg/dL
Specific Gravity, Urine: 1.005 (ref 1.005–1.030)
pH: 7 (ref 5.0–8.0)

## 2021-02-12 MED ORDER — CEPHALEXIN 500 MG PO CAPS
500.0000 mg | ORAL_CAPSULE | Freq: Two times a day (BID) | ORAL | 0 refills | Status: AC
  Filled 2021-02-12: qty 14, 7d supply, fill #0

## 2021-02-12 NOTE — ED Notes (Addendum)
IV in place on first attempt with blood return noted yet not able to obtain blood specimen for lab test. Provider made aware . Pt refuses to be stuck again , reporting she had blood test recently.  Provider made aware and blood work is to be discontinued per MD order.

## 2021-02-12 NOTE — Discharge Instructions (Addendum)
Your urinalysis today showed you have a urinary tract infection. I am prescribing you a course of antibiotics to take twice daily for 7 days. Make sure you're drinking lots of water and getting plenty of rest.  It is important you follow up with your neurologist for management of your vertigo and neuropathy.

## 2021-02-12 NOTE — ED Notes (Signed)
Pt's daughter on the phone appears to be very upset about canceling the blood work.  Attempted to explain to daughter that patient refused the lab works , however the daughter was yelling and extremely verbally aggressive , daughter hung up the phone .

## 2021-02-12 NOTE — ED Notes (Signed)
Assisted patient to restroom, pt apoligezed for her daughter behavior and stated " my daughter has a  bad temper , I am having this every day "

## 2021-02-12 NOTE — ED Provider Notes (Signed)
[facility] EMERGENCY DEPARTMENT Provider Note   CSN: [id number] Arrival date & time: 02/12/21  0912     History Chief Complaint  Patient presents with   multiple complaints    Sara Villanueva is a 75 y.o. female who presents with a feeling of pins and needles all over her body x 4 days. She is in physical therapy for vertigo, and states this has been worse recently also. States her vertigo is worse with quick movements and standing for long periods of time. Has not been taking her prescribed Meclizine. Had MRI of brain and inner ears on 8/20 which showed no acute findings. She was dx with neuropathy several years ago, was prescribed gabapentin, but is not taking this anymore. Reports some ongoing memory loss. She lives at home with her daughter. Discussed with daughter Alcario Drought on the phone who states the patient's mental state is at baseline, denies any recent changes in memory or confusion.   HPI     Past Medical History:  Diagnosis Date   Arthritis    Bladder disorder    CAD (coronary artery disease)    stents x 3, 2013, 2017   Carotid arterial disease (HCC)    CHF (congestive heart failure) (HCC)    Colon polyps    Eczema    High cholesterol    Hypertension    Lower GI bleed    Rheumatoid arteritis (HCC)    Rheumatoid arthritis (HCC)     Patient Active Problem List   Diagnosis Date Noted   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   Chronic systolic congestive heart failure (HCC) 05/12/2019   Memory loss 03/21/2019   Near syncope 02/25/2019   UTI due to extended-spectrum beta lactamase (ESBL) producing Escherichia coli 02/25/2019   Urinary retention with incomplete bladder emptying 02/25/2019   Voiding dysfunction 02/14/2019   Venous insufficiency (chronic) (peripheral) 12/08/2018   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 10/27/2018   Mild cognitive impairment with memory loss 09/21/2018   Acute cystitis without hematuria 09/09/2018   Heme positive  stool 03/28/2018   S/P carotid endarterectomy 12/27/2017   Chronic constipation 10/18/2017   History of colon polyps 10/18/2017   Stenosis of lateral recess of lumbar spine 03/22/2017   Acute recurrent sinusitis 03/02/2017   Anemia, unspecified 01/31/2016   Knee pain, left 08/05/2015   Allergic rhinitis 07/15/2015   Stenosis of carotid artery 07/15/2015   PVD (peripheral vascular disease) (HCC) 07/15/2015   Renal artery stenosis (HCC) 07/15/2015   Bilateral carotid artery stenosis 07/15/2015   Arterial vascular disease 06/13/2015   Essential (primary) hypertension 06/13/2015   HLD (hyperlipidemia) 06/13/2015   Urgency of micturation 05/03/2015   Urination frequency 05/03/2015   Pedal edema 12/04/2014   Dysesthesia 12/04/2014   Nuclear sclerotic cataract 09/20/2014   Lumbar radiculopathy 08/16/2014   Spondylolisthesis at L5-S1 level 08/07/2014   Neck pain 08/07/2014   Chronic rheumatic arthritis (HCC) 08/07/2014   Rheumatoid arthritis (HCC) 08/07/2014   Angina pectoris (HCC) 08/03/2014   Aseptic bony necrosis (HCC) 08/03/2014   Aseptic necrosis of bone (HCC) 08/03/2014    Past Surgical History:  Procedure Laterality Date   CAROTID ENDARTERECTOMY     CORONARY ANGIOPLASTY WITH STENT PLACEMENT       OB History   No obstetric history on file.     Family History  Problem Relation Age of Onset   Heart disease Mother    Heart disease Father    Allergic rhinitis Sister    Asthma  Neg Hx    Eczema Neg Hx    Urticaria Neg Hx    Immunodeficiency Neg Hx    Angioedema Neg Hx     Social History   Tobacco Use   Smoking status: Former    Packs/day: 0.50    Years: 25.00    Pack years: 12.50    Types: Cigarettes    Quit date: 2000    Years since quitting: 22.7   Smokeless tobacco: Never  Vaping Use   Vaping Use: Never used  Substance Use Topics   Alcohol use: No    Alcohol/week: 0.0 standard drinks   Drug use: No    Home Medications Prior to Admission medications    Medication Sig Start Date End Date Taking? Authorizing Provider  cephALEXin (KEFLEX) 500 MG capsule Take 1 capsule (500 mg total) by mouth 2 (two) times daily for 7 days. 02/12/21 02/19/21 Yes Gerre Ranum T, PA-C  ascorbic acid (VITAMIN C) 100 MG tablet Take by mouth.    [provider]  clopidogrel (PLAVIX) 75 MG tablet Take 75 mg by mouth daily.    [provider]  diazepam (VALIUM) 5 MG tablet Take 0.5 tablets (2.5 mg total) by mouth every 12 (twelve) hours as needed (dizziness). 01/13/21   Pricilla Loveless, MD  donepezil (ARICEPT) 10 MG tablet Take 0.5 tablets (5 mg total) by mouth at bedtime. 01/01/21   Sater, Pearletha Furl, MD  meclizine (ANTIVERT) 25 MG tablet Take 1 tablet (25 mg total) by mouth 3 (three) times daily as needed for dizziness. 01/01/21   Sater, Pearletha Furl, MD  NITROSTAT 0.4 MG SL tablet Place 0.4 mg under the tongue every 5 (five) minutes as needed for chest pain.  07/08/14   [provider]  Triamcinolone Acetonide (NASACORT AQ NA) Place into the nose.    [provider]    Allergies    Other, Lamotrigine, Latex, and Nickel  Review of Systems   Review of Systems  Neurological:  Positive for dizziness, light-headedness and numbness.   Physical Exam Updated Vital Signs BP (!) 160/76 (BP Location: Right Arm)   Pulse 88   Temp 98.2 F (36.8 C) (Oral)   Resp 18   Ht 5\' 8"  (1.727 m)   Wt 53.1 kg   SpO2 100%   BMI 17.79 kg/m   Physical Exam  ED Results / Procedures / Treatments   Labs (all labs ordered are listed, but only abnormal results are displayed) Labs Reviewed  COMPREHENSIVE METABOLIC PANEL - Abnormal; Notable for the following components:      Result Value   Potassium 3.4 (*)    Glucose, Bld 102 (*)    All other components within normal limits  URINALYSIS, ROUTINE W REFLEX MICROSCOPIC - Abnormal; Notable for the following components:   APPearance CLOUDY (*)    Hgb urine dipstick TRACE (*)    Leukocytes,Ua LARGE (*)     All other components within normal limits  URINALYSIS, MICROSCOPIC (REFLEX) - Abnormal; Notable for the following components:   Bacteria, UA FEW (*)    All other components within normal limits  CBC WITH DIFFERENTIAL/PLATELET    EKG EKG Interpretation  Date/Time:  Wednesday February 12 2021 11:19:38 EDT Ventricular Rate:  84 PR Interval:  168 QRS Duration: 100 QT Interval:  385 QTC Calculation: 456 R Axis:   70 Text Interpretation: Sinus rhythm Right atrial enlargement LVH with secondary repolarization abnormality since last tracing no significant change Confirmed by [id number] [phone]) on 02/12/2021 11:33:17  AM  Radiology No results found.  Procedures Procedures   Medications Ordered in ED Medications - No data to display  ED Course  I have reviewed the triage vital signs and the nursing notes.  Pertinent labs & imaging results that were available during my care of the patient were reviewed by me and considered in my medical decision making (see chart for details).  Clinical Course as of 02/12/21 1502  Wed Feb 12, 2021  1130 Patient refused bloodwork, Dr Fredderick Phenix MD made aware, order discontinued [LR]  1156 Spoke to daughter on the phone who stated patient's mental state is at baseline. Discussed patient refused blood work. Daughter verbalized concern we are not addressing her underlying problem. [LR]  1232 Daughter at bedside. Patient now consenting to workup. Nursing staff to attempt second IV. [LR]    Clinical Course User Index [LR] Alajia Schmelzer, Lora Paula, PA-C   MDM Rules/Calculators/A&P                           Patient is 75 y/o female who presents with dizziness and "pins and needles all over her body". Symptoms seem to be chronic. Patient in PT for vertigo and is followed by neurology. Most recent MRI of brain and inner ear on 8/20 was normal.  Patient initially refused evaluation. Daughter arrived at bedside and discussed with patient. Patient later consented  to workup.  CBC and CMP unremarkable. EKG shows no change from prior. Urinalysis shows evidence of UTI. Patient appears otherwise well. I do not think she requires admission or inpatient treatment for her symptoms at this time. Discussed results and plan with patient. Patient stable to d/c to home with abx. Reinforced importance of neurology follow up for chronic vertigo and neuropathy. Patient and daughter agreeable to plan.   Final Clinical Impression(s) / ED Diagnoses Final diagnoses:  Numbness and tingling  Acute cystitis without hematuria    Rx / DC Orders ED Discharge Orders          Ordered    cephALEXin (KEFLEX) 500 MG capsule  2 times daily        02/12/21 1445             Greydis Stlouis T, PA-C 02/12/21 1502    Rolan Bucco, MD 02/13/21 1501

## 2021-02-12 NOTE — ED Triage Notes (Signed)
Reports having feeling of pins and needles all over her body for the last few months.  Also recently treated for vertigo.  Doing therapy but reports it has not gone away.  Also c/o having a lot of itching from her eczema.

## 2021-02-13 ENCOUNTER — Telehealth: Payer: Self-pay | Admitting: Neurology

## 2021-02-13 NOTE — Telephone Encounter (Signed)
Pt's daughter is asking for a call to discuss the new memory medication that pt is on. Pt daughter is also wanting pt to be seen about possible neuropathy.  Pt informed phone rep that Dr Epimenio Foot has never seen her about this feeling of pins and needles in both hands.  When pt was told that every diagnosis needs a referral she then said Dr Epimenio Foot saw her about this years ago.  Daughter came on call then and what was relayed to pt was said back to her.  Daughter then asked that an appointment be set for memory again although pt was seen in August.  Daughter was advised it would be documented of what she and pt was told. This is FYI, no call back has been requested about this.

## 2021-02-14 ENCOUNTER — Encounter: Payer: Federal, State, Local not specified - PPO | Admitting: Physical Therapy

## 2021-02-17 ENCOUNTER — Encounter: Payer: Medicare Other | Admitting: Physical Therapy

## 2021-02-20 ENCOUNTER — Other Ambulatory Visit: Payer: Self-pay

## 2021-02-20 ENCOUNTER — Ambulatory Visit (INDEPENDENT_AMBULATORY_CARE_PROVIDER_SITE_OTHER): Payer: Medicare Other | Admitting: Physical Therapy

## 2021-02-20 DIAGNOSIS — R293 Abnormal posture: Secondary | ICD-10-CM

## 2021-02-20 DIAGNOSIS — R42 Dizziness and giddiness: Secondary | ICD-10-CM

## 2021-02-20 DIAGNOSIS — R2681 Unsteadiness on feet: Secondary | ICD-10-CM

## 2021-02-20 NOTE — Therapy (Addendum)
[facility] [place], [place], [address] Phone: [phone]   Fax:  [phone]  Physical Therapy Treatment and Recertification and Discharge  Patient Details  Name: Sara Villanueva MRN: [id number] Date of Birth: 12-20-1945 Referring Provider (PT): Arlice Colt   Encounter Date: 02/20/2021   PT End of Session - 02/20/21 1151     Visit Number 7    Number of Visits 13    Date for PT Re-Evaluation 04/03/21    Authorization Type MCR + [employer]    Authorization - Visit Number 7    Progress Note Due on Visit 10    PT Start Time 1100    PT Stop Time 8466    PT Time Calculation (min) 43 min    Activity Tolerance Patient tolerated treatment well    Behavior During Therapy [facility] for tasks assessed/performed             Past Medical History:  Diagnosis Date   Arthritis    Bladder disorder    CAD (coronary artery disease)    stents x 3, 2013, 2017   Carotid arterial disease (HCC)    CHF (congestive heart failure) (HCC)    Colon polyps    Eczema    High cholesterol    Hypertension    Lower GI bleed    Rheumatoid arteritis ([place])    Rheumatoid arthritis ([place])     Past Surgical History:  Procedure Laterality Date   CAROTID ENDARTERECTOMY     CORONARY ANGIOPLASTY WITH STENT PLACEMENT      There were no vitals filed for this visit.   Subjective Assessment - 02/20/21 1106     Subjective Pt reports she has "not been feeling good" lately. Dizziness is unchanged    Patient Stated Goals get rid of dizziness.    Currently in Pain? No/denies                [facility] PT Assessment - 02/20/21 0001       Assessment   Medical Diagnosis H81.4 Vertigo of central origin    Referring Provider (PT) Felecia Shelling, Richard    Onset Date/Surgical Date 12/05/20      Merrilee Jansky Balance Test   Sit to Stand Able to stand without using hands and stabilize independently    Standing Unsupported Able to stand safely 2 minutes     Sitting with Back Unsupported but Feet Supported on Floor or Stool Able to sit safely and securely 2 minutes    Stand to Sit Sits safely with minimal use of hands    Transfers Able to transfer safely, minor use of hands    Standing Unsupported with Eyes Closed Able to stand 10 seconds safely    Standing Unsupported with Feet Together Able to place feet together independently and stand 1 minute safely    From Standing, Reach Forward with Outstretched Arm Can reach confidently >25 cm (10")    From Standing Position, Pick up Object from Floor Able to pick up shoe safely and easily    From Standing Position, Turn to Look Behind Over each Shoulder Turn sideways only but maintains balance    Turn 360 Degrees Able to turn 360 degrees safely but slowly    Standing Unsupported, Alternately Place Feet on Step/Stool Able to complete 4 steps without aid or supervision    Standing Unsupported, One Foot in [place] to place foot tandem independently and hold 30 seconds    Standing on One Leg Able  to lift leg independently and hold 5-10 seconds    Total Score 49                 Vestibular Assessment - 02/20/21 0001       Positional Sensitivities   Sit to Supine Mild dizziness    Supine to Right Side Mild dizziness    Supine to Sitting Mild dizziness    Rolling Right Mild dizziness    Rolling Left No dizziness                      [facility] Adult PT Treatment/Exercise - 02/20/21 0001       Bed Mobility   Bed Mobility Sit to Sidelying Right;Sit to Supine    Sit to Supine --   3 reps for habituation   Sit to Sidelying Right --   3 reps for habituation     Ambulation/Gait   Gait Comments gait with head turns vertically and horizontally with CGA, gait with reaching to touch objects on floor with mild dizziness, CGA             Vestibular Treatment/Exercise - 02/20/21 0001       Standing Horizontal Head Turns   Number of Reps  10      Standing Vertical Head Turns    Number of Reps  10                    PT Education - 02/20/21 1136     Education Details updated HEP and POC    Person(s) Educated Patient    Methods Explanation;Demonstration;Handout    Comprehension Verbalized understanding;Returned demonstration              PT Short Term Goals - 02/07/21 1328       PT SHORT TERM GOAL #1   Title n/a               PT Long Term Goals - 02/20/21 1117       PT LONG TERM GOAL #1   Title Pt. will be independent with progressive HEP to improve outcomes.    Status On-going    Target Date 04/03/21      PT LONG TERM GOAL #2   Title Pt. will report 50% improvement in dizziness symptoms.    Status On-going    Target Date 04/03/21      PT LONG TERM GOAL #3   Title Pt. will complete BERG with goal to be deteremined to decrease fall risk.  UPDATED GOAL:  Pt will improve Berg to > or equal to 52/56.    Baseline 49/56 on 02/20/21    Time 6    Period Weeks    Status On-going    Target Date 04/03/21      PT LONG TERM GOAL #4   Title Pt. will be able to transition from sitting to standing without dizziness or loss of balance.    Status Achieved      PT LONG TERM GOAL #5   Title Pt will perform supine <> sit with dizziness scale of 1    Baseline 2    Time 6    Period Weeks    Status New    Target Date 04/03/21                   Plan - 02/20/21 1151     Clinical Impression Statement Pt has decreased dizziness and improved balance since eval. She  still presents with dizziness with supine <> sit and with rolling right and still has balance deficts making her a moderate fall risk. pt will continue to benefit from skilled PT to address deficits and decreased risk of falls    Personal Factors and Comorbidities Age;Comorbidity 3+    Comorbidities history of vertigo, HTN, HLD, carotid artery disease, mild cognitive impairment, RA,    Examination-Activity Limitations [place];Stand;Transfers    Examination-Participation  Restrictions Driving;Community Activity;Shop    Stability/Clinical Decision Making Evolving/Moderate complexity    Clinical Decision Making Moderate    Rehab Potential Good    PT Frequency 1x / week    PT Duration 6 weeks    PT Treatment/Interventions Canalith Repostioning;Stair training;Functional mobility training;Therapeutic activities;Gait training;Therapeutic exercise;Balance training;Neuromuscular re-education;Passive range of motion;Vestibular;Visual/perceptual remediation/compensation;Joint Manipulations;Manual techniques;Patient/family education    PT Next Visit Plan continue to progress dynamic gait, balance and habituation    PT Home Exercise Plan [email or web address]    Consulted and Agree with Plan of Care Patient             Patient will benefit from skilled therapeutic intervention in order to improve the following deficits and impairments:  Dizziness, Abnormal gait, Decreased balance, Decreased safety awareness, Difficulty walking, Postural dysfunction, Impaired perceived functional ability  Visit Diagnosis: Dizziness and giddiness - Plan: PT plan of care cert/re-cert  Abnormal posture - Plan: PT plan of care cert/re-cert  Unsteady gait - Plan: PT plan of care cert/re-cert     Problem List Patient Active Problem List   Diagnosis Date Noted   Vertigo of central origin 37/03/6268   Chronic systolic congestive heart failure ([place]) 05/12/2019   Memory loss 03/21/2019   Near syncope 02/25/2019   UTI due to extended-spectrum beta lactamase (ESBL) producing Escherichia coli 02/25/2019   Urinary retention with incomplete bladder emptying 02/25/2019   Voiding dysfunction 02/14/2019   Venous insufficiency (chronic) (peripheral) 12/08/2018   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 10/27/2018   Mild cognitive impairment with memory loss 09/21/2018   Acute cystitis without hematuria 09/09/2018   Heme positive stool 03/28/2018   S/P carotid endarterectomy 12/27/2017    Chronic constipation 10/18/2017   History of colon polyps 10/18/2017   Stenosis of lateral recess of lumbar spine 03/22/2017   Acute recurrent sinusitis 03/02/2017   Anemia, unspecified 01/31/2016   Knee pain, left 08/05/2015   Allergic rhinitis 07/15/2015   Stenosis of carotid artery 07/15/2015   PVD (peripheral vascular disease) ([place]) 07/15/2015   Renal artery stenosis ([place]) 07/15/2015   Bilateral carotid artery stenosis 07/15/2015   Arterial vascular disease 06/13/2015   Essential (primary) hypertension 06/13/2015   HLD (hyperlipidemia) 06/13/2015   Urgency of micturation 05/03/2015   Urination frequency 05/03/2015   Pedal edema 12/04/2014   Dysesthesia 12/04/2014   Nuclear sclerotic cataract 09/20/2014   Lumbar radiculopathy 08/16/2014   Spondylolisthesis at L5-S1 level 08/07/2014   Neck pain 08/07/2014   Chronic rheumatic arthritis ([place]) 08/07/2014   Rheumatoid arthritis ([place]) 08/07/2014   Angina pectoris ([place]) 08/03/2014   Aseptic bony necrosis ([place]) 08/03/2014   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 Patient goals were partially met. Patient is being discharged due to not returning since the last visit.  Isabelle Course, PT,DPT11/16/2211:31 AM  Isabelle Course, PT 02/20/2021,  11:55 AM  [facility] [place], [place], [address] Phone: [phone]   Fax:  [phone]  Name: Sara Villanueva MRN: [id number] Date of Birth: 07/22/1945

## 2021-02-20 NOTE — Patient Instructions (Signed)
Access Code: [email or web address] URL: [email or web address] Date: 02/20/2021 Prepared by: Reggy Eye  Exercises Sit to Stand with Armchair - 2 x daily - 7 x weekly - 2 sets - 8 reps Wide Stance with Head Rotations and Unilateral Counter Support - 2 x daily - 7 x weekly - 1 sets - 10 reps Side Stepping with Counter Support - 2 x daily - 7 x weekly - 1 sets - 10 reps Seated Gaze Stabilization with Head Rotation - 1 x daily - 7 x weekly - 3 sets - 10 reps Seated Gaze Stabilization with Head Nod - 1 x daily - 7 x weekly - 3 sets - 10 reps Supine to Long Sitting Vestibular Habituation - 3 x daily - 7 x weekly - 1 sets - 5 reps Supine to Right Sidelying Vestibular Habituation - 3 x daily - 7 x weekly - 1 sets - 5 reps

## 2021-02-28 ENCOUNTER — Encounter: Payer: Medicare Other | Admitting: Physical Therapy

## 2021-04-09 ENCOUNTER — Encounter: Payer: Self-pay | Admitting: Neurology

## 2021-04-09 ENCOUNTER — Ambulatory Visit (INDEPENDENT_AMBULATORY_CARE_PROVIDER_SITE_OTHER): Payer: Medicare Other | Admitting: Neurology

## 2021-04-09 VITALS — BP 136/75 | HR 85 | Ht 68.0 in | Wt 113.5 lb

## 2021-04-09 DIAGNOSIS — R413 Other amnesia: Secondary | ICD-10-CM | POA: Diagnosis not present

## 2021-04-09 DIAGNOSIS — I6529 Occlusion and stenosis of unspecified carotid artery: Secondary | ICD-10-CM

## 2021-04-09 DIAGNOSIS — R200 Anesthesia of skin: Secondary | ICD-10-CM | POA: Diagnosis not present

## 2021-04-09 DIAGNOSIS — R55 Syncope and collapse: Secondary | ICD-10-CM

## 2021-04-09 DIAGNOSIS — H814 Vertigo of central origin: Secondary | ICD-10-CM | POA: Diagnosis not present

## 2021-04-09 DIAGNOSIS — M069 Rheumatoid arthritis, unspecified: Secondary | ICD-10-CM

## 2021-04-09 DIAGNOSIS — F03A0 Unspecified dementia, mild, without behavioral disturbance, psychotic disturbance, mood disturbance, and anxiety: Secondary | ICD-10-CM | POA: Diagnosis not present

## 2021-04-09 NOTE — Progress Notes (Signed)
[facility]  PATIENT: Sara Villanueva DOB: 05-11-1946  REFERRING DOCTOR OR PCP:  Rodena Medin SOURCE: patient  _________________________________   HISTORICAL  CHIEF COMPLAINT:  Chief Complaint  Patient presents with   Follow-up    Rm 2, w daughter. Here for memory f/u. Pt would like to discuss her medications. Pt only took 3 doses of donepezil.     HISTORY OF PRESENT ILLNESS:  She is a 75 yo woman with chronic lower back pain and memory loss  Update  04/09/2021: She continues to experience memory loss that is mostly stable.  She is forgetful and misplaces items.  She stopped donepezil due to GI upset.   She saw GI and amitriptyline was started for irritanle bowel/cramps.   Dicyclomine had previously been started for cramps.     She has vertigo that seems fairly constant to her.   Her daughter feels it is intermittent.    Meclizine helped at first but not after a few months   Vestibular therapy had helped her some.   MRI of the brain with attention to the internal auditory canals showed atrophy and chronic microvascular ischemic change.  No source of the vertigo was identified.  She notes tingling in her fingers, especially the fingertips, and feels this makes her drop items.   Her fingertips turn white when cold in winter but not during summer.    She denies pain.      She scored 15/30 on the MoCA at the last visit.    Sightly progressed since 2019.     She is sleeping well most nights.  She feels mood is doing better with 8 hours of sleep.    She has not needed any medication for anxiety recently.      No more episodes of near-syncope.   She has carotid arterial disease and underwent right carotid endarterectomy July 2019.  She follows up with vascular surgery.  She also has had bilateral renal artery stents and has coronary artery disease.  Vascular risk factors include hyperlipidemia and HTN.     She had two stents placed in arteries for the GI system.  She is  on Plavix (was on DAPT but aspirin was d/c).  She exercises with a treadmill.    She had her second vaccination a couple weeks ago.      Montreal Cognitive Assessment  01/01/2021 01/01/2021 03/21/2019  Visuospatial/ Executive (0/5) 0 (No Data) 1  Naming (0/3) '3 3 3  ' Attention: Read list of digits (0/2) '2 2 1  ' Attention: Read list of letters (0/1) 0 0 1  Attention: Serial 7 subtraction starting at 100 (0/3) '1 1 1  ' Language: Repeat phrase (0/2) '2 2 2  ' Language : Fluency (0/1) 0 0 0  Abstraction (0/2) '1 1 1  ' Delayed Recall (0/5) 0 0 2  Orientation (0/6) '6 6 6  ' Total 15 - 18  Adjusted Score (based on education) 16 - 19      MRI of the brain 01/18/2021 with added attention to the internal auditory canal showed 1.   The internal auditory canals and contents appeared normal before and after contrast. 2.   Mild generalized cortical atrophy, stable compared to the 10/11/2019 MRI. 3.   Scattered T2/FLAIR hyperintense foci in the hemispheres and pons consistent with mild to moderate chronic microvascular ischemic change.  Stable compared to the 10/11/2019 MRI. 4.   No acute findings.  Normal enhancement pattern.   MRI lumbar 05/2017 showed  spondylolisthesis of L5  upon S1 associated with facet hypertrophy and disc bulging of the uncovered disc. There is moderately severe left foraminal narrowing with possible left L5 nerve root compression. Additionally there is moderate bilateral lateral recess stenosis. No nerve root impingement at other levels.    REVIEW OF SYSTEMS: Constitutional: No fevers, chills, sweats, or change in appetite Eyes: No visual changes, double vision, eye pain Ear, nose and throat: No hearing loss, ear pain, nasal congestion, sore throat Cardiovascular: No chest pain, palpitations Respiratory:  No shortness of breath at rest or with exertion.   No wheezes GastrointestinaI: No nausea, vomiting, diarrhea, abdominal pain, fecal incontinence Genitourinary:  No dysuria, urinary  retention or frequency.  No nocturia. Musculoskeletal:  Mild neck pain; moderate back pain and left hip pain Integumentary: No rash, pruritus, skin lesions Neurological: as above Psychiatric: No depression at this time.  No anxiety Endocrine: No palpitations, diaphoresis, change in appetite, change in weigh or increased thirst Hematologic/Lymphatic:  No anemia, purpura, petechiae. Allergic/Immunologic: No itchy/runny eyes, nasal congestion, recent allergic reactions, rashes  ALLERGIES: Allergies  Allergen Reactions   Other Other (See Comments)    Prefers paper tape, allergic to tree pollen   Lamotrigine Rash   Latex Other (See Comments) and Rash    unknown    Nickel Rash    HOME MEDICATIONS:  Current Outpatient Medications:    ascorbic acid (VITAMIN C) 100 MG tablet, Take by mouth., Disp: , Rfl:    clopidogrel (PLAVIX) 75 MG tablet, Take 75 mg by mouth daily., Disp: , Rfl:    diazepam (VALIUM) 5 MG tablet, Take 0.5 tablets (2.5 mg total) by mouth every 12 (twelve) hours as needed (dizziness)., Disp: 6 tablet, Rfl: 0   donepezil (ARICEPT) 10 MG tablet, Take 0.5 tablets (5 mg total) by mouth at bedtime., Disp: 30 tablet, Rfl: 11   meclizine (ANTIVERT) 25 MG tablet, Take 1 tablet (25 mg total) by mouth 3 (three) times daily as needed for dizziness., Disp: 90 tablet, Rfl: 1   NITROSTAT 0.4 MG SL tablet, Place 0.4 mg under the tongue every 5 (five) minutes as needed for chest pain. , Disp: , Rfl:    Triamcinolone Acetonide (NASACORT AQ NA), Place into the nose., Disp: , Rfl:   PAST MEDICAL HISTORY: Past Medical History:  Diagnosis Date   Arthritis    Bladder disorder    CAD (coronary artery disease)    stents x 3, 2013, 2017   Carotid arterial disease (HCC)    CHF (congestive heart failure) (HCC)    Colon polyps    Eczema    High cholesterol    Hypertension    Lower GI bleed    Rheumatoid arteritis (HCC)    Rheumatoid arthritis ([place])     PAST SURGICAL HISTORY: Past  Surgical History:  Procedure Laterality Date   CAROTID ENDARTERECTOMY     CORONARY ANGIOPLASTY WITH STENT PLACEMENT      FAMILY HISTORY: Family History  Problem Relation Age of Onset   Heart disease Mother    Heart disease Father    Allergic rhinitis Sister    Asthma Neg Hx    Eczema Neg Hx    Urticaria Neg Hx    Immunodeficiency Neg Hx    Angioedema Neg Hx     SOCIAL HISTORY:  Social History   Socioeconomic History   Marital status: Widowed    Spouse name: Not on file   Number of children: Not on file   Years of education: Not on file  Highest education level: Not on file  Occupational History   Not on file  Tobacco Use   Smoking status: Former    Packs/day: 0.50    Years: 25.00    Pack years: 12.50    Types: Cigarettes    Quit date: 2000    Years since quitting: 22.8   Smokeless tobacco: Never  Vaping Use   Vaping Use: Never used  Substance and Sexual Activity   Alcohol use: No    Alcohol/week: 0.0 standard drinks   Drug use: No   Sexual activity: Not on file  Other Topics Concern   Not on file  Social History Narrative   Not on file   Social Determinants of Health   Financial Resource Strain: Not on file  Food Insecurity: Not on file  Transportation Needs: Not on file  Physical Activity: Not on file  Stress: Not on file  Social Connections: Not on file  Intimate Partner Violence: Not on file     PHYSICAL EXAM  Vitals:   04/09/21 1335  BP: 136/75  Pulse: 85  Weight: 113 lb 8 oz (51.5 kg)  Height: '5\' 8"'  (1.727 m)    Body mass index is 17.26 kg/m.   General: The patient is well-developed and well-nourished and in no acute distress.  Tympanic membranes intact  Skin/Musculoskeletal:    There is mild pedal edema.  No rashes.    Neurologic Exam  Mental status: The patient is alert and oriented x 3 at the time of the examination.  Reduced short-term memory and focus  Cranial nerves: Extraocular movements are full.    Facial strength  and sensation is normal.  Hearing is normal and appears to be symmetric.  The Weber test did not lateralize.  Motor:  Muscle bulk is normal.   Tone is normal. Strength is 5/5 in the arms and legs except for 4+/5 EHL strength on the left..   Sensory: She has intact sensation to touch and vibration in the hands, including the fingertips. In the legs, she has symmetric vibration sensation.  There is reduced sensation in the L5 distribution on the left.  Gait and station: Station is normal.   The gait is arthritic.  Tandem gait is wide.. There is no Romberg sign.   Reflexes: Deep tendon reflexes are symmetric and normal bilaterally.     Maneuvers: The Dix-Hallpike maneuver did not evoked nystagmus to either direction.  However, she did have mild vertigo with the move was done.    DIAGNOSTIC DATA (LABS, IMAGING, TESTING) - I reviewed patient records, labs, notes, testing and imaging myself where available.  Lab Results  Component Value Date   WBC 6.1 02/12/2021   HGB 13.3 02/12/2021   HCT 40.9 02/12/2021   MCV 96.2 02/12/2021   PLT 157 02/12/2021      Component Value Date/Time   NA 142 02/12/2021 1300   K 3.4 (L) 02/12/2021 1300   CL 109 02/12/2021 1300   CO2 25 02/12/2021 1300   GLUCOSE 102 (H) 02/12/2021 1300   BUN 16 02/12/2021 1300   CREATININE 0.80 02/12/2021 1300   CALCIUM 9.8 02/12/2021 1300   PROT 7.5 02/12/2021 1300   ALBUMIN 4.0 02/12/2021 1300   AST 37 02/12/2021 1300   ALT 41 02/12/2021 1300   ALKPHOS 78 02/12/2021 1300   BILITOT 0.4 02/12/2021 1300   GFRNONAA >60 02/12/2021 1300   GFRAA >60 11/04/2019 1326       ASSESSMENT AND PLAN  Mild dementia without behavioral disturbance,  psychotic disturbance, mood disturbance, or anxiety, unspecified dementia type - Plan: Sjogren's syndrome antibods(ssa + ssb), Multiple Myeloma Panel (SPEP&IFE w/QIG)  Numbness - Plan: Sjogren's syndrome antibods(ssa + ssb), Multiple Myeloma Panel (SPEP&IFE w/QIG)  Memory  loss  Vertigo of central origin  Rheumatoid arthritis, involving unspecified site, unspecified whether rheumatoid factor present ([place])  Stenosis of carotid artery, unspecified laterality  Near syncope   1.  Stop amitriptyline as it may worsen cognition.  I would also like her to stop the dicyclomine as it is also an anticholinergic but she reports that it helps her GI cramps a lot and she does not want to stop..  She prefers to be off donepezil.  She will take folic acid 0.8 mg daily. 2.   Check SPEP/IEF and ssa/ssb.  If numbness worsens consider NCV/EMG 3.   return to clinic in 12 months or sooner based on results or if there are new or worsening neurologic symptoms.  Jana Swartzlander A. Felecia Shelling, MD, PhD 73/01/5843, 1:71 PM Certified in Neurology, [place] [facility], Sleep Medicine, Pain Medicine and Neuroimaging  [facility] [address] [place], [place] [address] [phone]

## 2021-04-14 LAB — MULTIPLE MYELOMA PANEL, SERUM
Albumin SerPl Elph-Mcnc: 3.4 g/dL (ref 2.9–4.4)
Albumin/Glob SerPl: 1.2 (ref 0.7–1.7)
Alpha 1: 0.2 g/dL (ref 0.0–0.4)
Alpha2 Glob SerPl Elph-Mcnc: 0.8 g/dL (ref 0.4–1.0)
B-Globulin SerPl Elph-Mcnc: 1 g/dL (ref 0.7–1.3)
Gamma Glob SerPl Elph-Mcnc: 0.8 g/dL (ref 0.4–1.8)
Globulin, Total: 2.9 g/dL (ref 2.2–3.9)
IgA/Immunoglobulin A, Serum: 305 mg/dL (ref 64–422)
IgG (Immunoglobin G), Serum: 889 mg/dL (ref 586–1602)
IgM (Immunoglobulin M), Srm: 44 mg/dL (ref 26–217)
Total Protein: 6.3 g/dL (ref 6.0–8.5)

## 2021-04-14 LAB — SJOGREN'S SYNDROME ANTIBODS(SSA + SSB)
ENA SSA (RO) Ab: <0.2 AI (ref 0.0–0.9)
ENA SSB (LA) Ab: <0.2 AI (ref 0.0–0.9)

## 2021-05-05 ENCOUNTER — Encounter (HOSPITAL_BASED_OUTPATIENT_CLINIC_OR_DEPARTMENT_OTHER): Payer: Self-pay | Admitting: Emergency Medicine

## 2021-05-05 ENCOUNTER — Emergency Department (HOSPITAL_BASED_OUTPATIENT_CLINIC_OR_DEPARTMENT_OTHER): Payer: Medicare Other

## 2021-05-05 ENCOUNTER — Other Ambulatory Visit: Payer: Self-pay

## 2021-05-05 ENCOUNTER — Emergency Department (HOSPITAL_BASED_OUTPATIENT_CLINIC_OR_DEPARTMENT_OTHER)
Admission: EM | Admit: 2021-05-05 | Discharge: 2021-05-05 | Disposition: A | Discharge status: Home / Self Care | Payer: Medicare Other | Attending: Emergency Medicine | Admitting: Emergency Medicine

## 2021-05-05 DIAGNOSIS — I2510 Atherosclerotic heart disease of native coronary artery without angina pectoris: Secondary | ICD-10-CM | POA: Diagnosis not present

## 2021-05-05 DIAGNOSIS — I110 Hypertensive heart disease with heart failure: Secondary | ICD-10-CM | POA: Diagnosis not present

## 2021-05-05 DIAGNOSIS — R1031 Right lower quadrant pain: Secondary | ICD-10-CM | POA: Diagnosis present

## 2021-05-05 DIAGNOSIS — K5900 Constipation, unspecified: Secondary | ICD-10-CM

## 2021-05-05 DIAGNOSIS — F0390 Unspecified dementia without behavioral disturbance: Secondary | ICD-10-CM | POA: Diagnosis not present

## 2021-05-05 DIAGNOSIS — Z7901 Long term (current) use of anticoagulants: Secondary | ICD-10-CM | POA: Diagnosis not present

## 2021-05-05 DIAGNOSIS — Z79899 Other long term (current) drug therapy: Secondary | ICD-10-CM | POA: Insufficient documentation

## 2021-05-05 DIAGNOSIS — I5022 Chronic systolic (congestive) heart failure: Secondary | ICD-10-CM | POA: Diagnosis not present

## 2021-05-05 DIAGNOSIS — Z87891 Personal history of nicotine dependence: Secondary | ICD-10-CM | POA: Insufficient documentation

## 2021-05-05 DIAGNOSIS — Z91040 Latex allergy status: Secondary | ICD-10-CM | POA: Diagnosis not present

## 2021-05-05 DIAGNOSIS — R109 Unspecified abdominal pain: Secondary | ICD-10-CM

## 2021-05-05 LAB — CBC WITH DIFFERENTIAL/PLATELET
Abs Immature Granulocytes: 0.01 10*3/uL (ref 0.00–0.07)
Basophils Absolute: 0 10*3/uL (ref 0.0–0.1)
Basophils Relative: 1 %
Eosinophils Absolute: 0.1 10*3/uL (ref 0.0–0.5)
Eosinophils Relative: 2 %
HCT: 40.4 % (ref 36.0–46.0)
Hemoglobin: 13 g/dL (ref 12.0–15.0)
Immature Granulocytes: 0 %
Lymphocytes Relative: 18 %
Lymphs Abs: 0.7 10*3/uL (ref 0.7–4.0)
MCH: 31 pg (ref 26.0–34.0)
MCHC: 32.2 g/dL (ref 30.0–36.0)
MCV: 96.4 fL (ref 80.0–100.0)
Monocytes Absolute: 0.3 10*3/uL (ref 0.1–1.0)
Monocytes Relative: 7 %
Neutro Abs: 2.8 10*3/uL (ref 1.7–7.7)
Neutrophils Relative %: 72 %
Platelets: 212 10*3/uL (ref 150–400)
RBC: 4.19 MIL/uL (ref 3.87–5.11)
RDW: 13.5 % (ref 11.5–15.5)
WBC: 3.9 10*3/uL — ABNORMAL LOW (ref 4.0–10.5)
nRBC: 0 % (ref 0.0–0.2)

## 2021-05-05 LAB — COMPREHENSIVE METABOLIC PANEL
ALT: 25 U/L (ref 0–44)
AST: 27 U/L (ref 15–41)
Albumin: 3.9 g/dL (ref 3.5–5.0)
Alkaline Phosphatase: 79 U/L (ref 38–126)
Anion gap: 9 (ref 5–15)
BUN: 13 mg/dL (ref 8–23)
CO2: 22 mmol/L (ref 22–32)
Calcium: 9.7 mg/dL (ref 8.9–10.3)
Chloride: 105 mmol/L (ref 98–111)
Creatinine, Ser: 0.88 mg/dL (ref 0.44–1.00)
GFR, Estimated: >60 mL/min (ref >60)
Glucose, Bld: 82 mg/dL (ref 70–99)
Potassium: 3.8 mmol/L (ref 3.5–5.1)
Sodium: 136 mmol/L (ref 135–145)
Total Bilirubin: 0.4 mg/dL (ref 0.3–1.2)
Total Protein: 7.5 g/dL (ref 6.5–8.1)

## 2021-05-05 LAB — URINALYSIS, MICROSCOPIC (REFLEX)

## 2021-05-05 LAB — URINALYSIS, ROUTINE W REFLEX MICROSCOPIC
Bilirubin Urine: NEGATIVE
Glucose, UA: NEGATIVE mg/dL
Hgb urine dipstick: NEGATIVE
Ketones, ur: NEGATIVE mg/dL
Nitrite: NEGATIVE
Protein, ur: NEGATIVE mg/dL
Specific Gravity, Urine: 1.015 (ref 1.005–1.030)
pH: 7 (ref 5.0–8.0)

## 2021-05-05 LAB — LIPASE, BLOOD: Lipase: 28 U/L (ref 11–51)

## 2021-05-05 MED ORDER — GOLYTELY 236 G PO SOLR: 4000.0000 mL | Freq: Once | ORAL | 0 refills | Status: AC

## 2021-05-05 MED ORDER — SODIUM CHLORIDE 0.9 % IV BOLUS
500.0000 mL | Freq: Once | INTRAVENOUS | Status: AC
  Administered 2021-05-05: 500 mL via INTRAVENOUS

## 2021-05-05 NOTE — ED Provider Notes (Signed)
[facility] Provider Note   CSN: [id number] Arrival date & time: 05/05/21  [id number]     History Chief Complaint  Patient presents with   Abdominal Pain    Sara Villanueva is a 75 y.o. female.  The history is provided by the patient.  Abdominal Pain Pain location:  RLQ and suprapubic Pain quality: aching   Pain radiates to:  Does not radiate Pain severity:  Mild Onset quality:  Gradual Timing:  Intermittent Progression:  Waxing and waning Context: not previous surgeries   Relieved by:  Nothing Worsened by:  Nothing Associated symptoms: constipation   Associated symptoms: no anorexia, no belching, no chest pain, no chills, no cough, no diarrhea, no dysuria, no fatigue, no fever, no flatus, no hematuria, no melena, no nausea, no shortness of breath, no sore throat and no vomiting       Past Medical History:  Diagnosis Date   Arthritis    Bladder disorder    CAD (coronary artery disease)    stents x 3, 2013, 2017   Carotid arterial disease (HCC)    CHF (congestive heart failure) (HCC)    Colon polyps    Eczema    High cholesterol    Hypertension    Lower GI bleed    Rheumatoid arteritis (HCC)    Rheumatoid arthritis ([place])     Patient Active Problem List   Diagnosis Date Noted   Mild dementia without behavioral disturbance, psychotic disturbance, mood disturbance, or anxiety 04/09/2021   Numbness 04/09/2021   Vertigo of central origin [id number]   Chronic systolic congestive heart failure ([place]) 05/12/2019   Memory loss 03/21/2019   Near syncope 02/25/2019   UTI due to extended-spectrum beta lactamase (ESBL) producing Escherichia coli 02/25/2019   Urinary retention with incomplete bladder emptying 02/25/2019   Voiding dysfunction 02/14/2019   Venous insufficiency (chronic) (peripheral) 12/08/2018   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) 07/15/2015   Bilateral carotid artery stenosis 07/15/2015   Arterial vascular disease 06/13/2015   Essential (primary) hypertension 06/13/2015   HLD (hyperlipidemia) 06/13/2015   Urgency of micturation 05/03/2015   Urination frequency 05/03/2015   Pedal edema 12/04/2014   Dysesthesia 12/04/2014   Nuclear sclerotic cataract 09/20/2014   Lumbar radiculopathy 08/16/2014   Spondylolisthesis at L5-S1 level 08/07/2014   Neck pain 08/07/2014   Chronic rheumatic arthritis ([place]) 08/07/2014   Rheumatoid arthritis ([place]) 08/07/2014   Angina pectoris ([place]) 08/03/2014   Aseptic bony necrosis ([place]) 08/03/2014   Aseptic necrosis of bone ([place]) 08/03/2014    Past Surgical History:  Procedure Laterality Date   CAROTID ENDARTERECTOMY     CORONARY ANGIOPLASTY WITH STENT PLACEMENT       OB History   No obstetric history on file.     Family History  Problem Relation Age of Onset   Heart disease Mother    Heart disease Father    Allergic rhinitis Sister    Asthma  Neg Hx    Eczema Neg Hx    Urticaria Neg Hx    Immunodeficiency Neg Hx    Angioedema Neg Hx     Social History   Tobacco Use   Smoking status: Former    Packs/day: 0.50    Years: 25.00    Pack years: 12.50    Types: Cigarettes    Quit date: 2000    Years since quitting: 22.9   Smokeless tobacco: Never  Vaping Use   Vaping Use: Never used  Substance Use Topics   Alcohol use: No    Alcohol/week: 0.0 standard drinks   Drug use: No     Home Medications Prior to Admission medications   Medication Sig Start Date End Date Taking? Authorizing Provider  polyethylene glycol (GOLYTELY) 236 g solution Take 4,000 mLs by mouth once for 1 dose. 05/05/21 05/05/21 Yes Gwendolyn Mclees, DO  ascorbic acid (VITAMIN C) 100 MG tablet Take by mouth.    [provider]  clopidogrel (PLAVIX) 75 MG tablet Take 75 mg by mouth daily.    [provider]  diazepam (VALIUM) 5 MG tablet Take 0.5 tablets (2.5 mg total) by mouth every 12 (twelve) hours as needed (dizziness). 01/13/21   Sherwood Gambler, MD  donepezil (ARICEPT) 10 MG tablet Take 0.5 tablets (5 mg total) by mouth at bedtime. 01/01/21   Sater, Nanine Means, MD  meclizine (ANTIVERT) 25 MG tablet Take 1 tablet (25 mg total) by mouth 3 (three) times daily as needed for dizziness. 01/01/21   Sater, Nanine Means, MD  NITROSTAT 0.4 MG SL tablet Place 0.4 mg under the tongue every 5 (five) minutes as needed for chest pain.  07/08/14   [provider]  Triamcinolone Acetonide (NASACORT AQ NA) Place into the nose.    [provider]    Allergies    Other, Lamotrigine, Latex, and Nickel  Review of Systems   Review of Systems  Constitutional:  Negative for chills, fatigue and fever.  HENT:  Negative for ear pain and sore throat.   Eyes:  Negative for pain and visual disturbance.  Respiratory:  Negative for cough and shortness of breath.   Cardiovascular:  Negative for chest pain and palpitations.  Gastrointestinal:  Positive for abdominal pain and constipation. Negative for anorexia, diarrhea, flatus, melena, nausea and vomiting.  Genitourinary:  Negative for dysuria and hematuria.  Musculoskeletal:  Negative for arthralgias and back pain.  Skin:  Negative for color change and rash.  Neurological:  Negative for seizures and syncope.  All other systems reviewed and are negative.  Physical Exam Updated Vital Signs BP 120/66   Pulse 82   Temp 97.9 F (36.6 C) (Oral)    Resp 14   Wt 49.9 kg   SpO2 98%   BMI 16.73 kg/m   Physical Exam Vitals and nursing note reviewed.  Constitutional:      General: She is not in acute distress.    Appearance: She is well-developed. She is not ill-appearing.  HENT:     Head: Normocephalic and atraumatic.     Mouth/Throat:     Mouth: Mucous membranes are moist.  Eyes:     Extraocular Movements: Extraocular movements intact.     Conjunctiva/sclera: Conjunctivae normal.     Pupils: Pupils are equal, round, and reactive to light.  Cardiovascular:     Rate and Rhythm: Normal rate and regular rhythm.     Heart sounds: Normal heart sounds. No murmur heard. Pulmonary:  Effort: Pulmonary effort is normal. No respiratory distress.     Breath sounds: Normal breath sounds.  Abdominal:     Palpations: Abdomen is soft.     Tenderness: There is abdominal tenderness in the right lower quadrant and suprapubic area.     Hernia: No hernia is present.  Musculoskeletal:        General: No swelling.     Cervical back: Neck supple.  Skin:    General: Skin is warm and dry.     Capillary Refill: Capillary refill takes less than 2 seconds.  Neurological:     Mental Status: She is alert.  Psychiatric:        Mood and Affect: Mood normal.    ED Results / Procedures / Treatments   Labs (all labs ordered are listed, but only abnormal results are displayed) Labs Reviewed  CBC WITH DIFFERENTIAL/PLATELET - Abnormal; Notable for the following components:      Result Value   WBC 3.9 (*)    All other components within normal limits  URINALYSIS, ROUTINE W REFLEX MICROSCOPIC - Abnormal; Notable for the following components:   Leukocytes,Ua TRACE (*)    All other components within normal limits  URINALYSIS, MICROSCOPIC (REFLEX) - Abnormal; Notable for the following components:   Bacteria, UA RARE (*)    All other components within normal limits  COMPREHENSIVE METABOLIC PANEL  LIPASE, BLOOD    EKG EKG  Interpretation  Date/Time:  Monday May 05 2021 07:29:28 EST Ventricular Rate:  101 PR Interval:  150 QRS Duration: 104 QT Interval:  342 QTC Calculation: 444 R Axis:   73 Text Interpretation: Sinus tachycardia Biatrial enlargement LVH with secondary repolarization abnormality No significant change since last tracing Confirmed by Lennice Sites ([id number]) on 05/05/2021 7:34:52 AM  Radiology CT ABDOMEN PELVIS WO CONTRAST  Result Date: 05/05/2021 CLINICAL DATA:  Abdominal pain, constipation EXAM: CT ABDOMEN AND PELVIS WITHOUT CONTRAST TECHNIQUE: Multidetector CT imaging of the abdomen and pelvis was performed following the standard protocol without IV contrast. COMPARISON:  07/29/2020 FINDINGS: Lower chest: Small pericardial effusion is seen. Coronary artery calcifications are seen. Increased interstitial markings in both lower lung fields suggest scarring. Hepatobiliary: Scattered calcifications are seen. There is no dilation of bile ducts. Gallbladder is unremarkable. Pancreas: No focal abnormality is seen. Spleen: Unremarkable. Adrenals/Urinary Tract: Left adrenal appears more prominent than right. There is no significant hydronephrosis. Right kidney is smaller than left. There are scattered arterial calcifications in the renal artery branches. Possibility of small nonobstructing renal stone is not excluded. There is 2.2 cm fluid density structure in the upper pole of left kidney suggesting renal cyst. There is slight prominence of left renal pelvis. Ureters are not dilated. Urinary bladder is distended. Stomach/Bowel: There is no significant small bowel dilation. Appendix is not seen. There is no focal pericecal inflammation. There is no significant wall thickening in colon. Vascular/Lymphatic: Extensive arterial calcifications are seen. Vascular stents are noted in the celiac axis renal arteries and both common iliac arteries Reproductive: Unremarkable. Other: There is no ascites or pneumoperitoneum.  Musculoskeletal: There is first-degree spondylolisthesis at L4-L5 level. There is sclerosis in the both femoral heads, possibly suggesting avascular necrosis. There is no flattening of articular surfaces of both proximal femurs. IMPRESSION: There is no evidence of intestinal obstruction or pneumoperitoneum. There is no hydronephrosis. Extensive atherosclerotic changes are noted. There is atrophy in the right kidney. Left renal cyst. There are multiple small calcifications in both kidneys, possibly arterial calcifications. Possibility of small nonobstructive ureteral  stone is not excluded. 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 Coronary artery calcifications are seen. Small pericardial effusion is present. Other findings as described in the body of the report. Electronically Signed   By: Ernie Avena M.D.   On: 05/05/2021 08:49    Procedures Procedures   Medications Ordered in ED Medications  sodium chloride 0.9 % bolus 500 mL (0 mLs Intravenous Stopped 05/05/21 1010)    ED Course  I have reviewed the triage vital signs and the nursing notes.  Pertinent labs & imaging results that were available during my care of the patient were reviewed by me and considered in my medical decision making (see chart for details).    MDM Rules/Calculators/A&P                           Harrietta Incorvaia is here with lower abdominal pain.  History of dementia.  Denies any pain with urination or nausea or vomiting.  Concern for possibly constipation.  Normal vitals.  No fever.  Well-appearing.  Will get basic labs and CT scan abdomen and pelvis.  Will rule out bowel obstruction, appendicitis/evaluate for stool burden.  Will check for urine infection.  Urinalysis negative for infection.  CT scan of the abdomen pelvis overall unremarkable.  No stool impaction.  No obstruction.  Overall extensive conversation about constipation treatments were talked about.  At the end the preferred GoLytely which has  been prescribed.  Recommend drinking may be 500 cc over the course of today and titrating to effect.  Recommend follow-up with primary care doctor.  Discharged in good condition.  This chart was dictated using voice recognition software.  Despite best efforts to proofread,  errors can occur which can change the documentation meaning.   Final Clinical Impression(s) / ED Diagnoses Final diagnoses:  Abdominal pain, unspecified abdominal location  Constipation, unspecified constipation type    Rx / DC Orders ED Discharge Orders          Ordered    polyethylene glycol (GOLYTELY) 236 g solution   Once        05/05/21 1038             [place], DO 05/05/21 1047

## 2021-05-05 NOTE — ED Notes (Signed)
Attempted to obtain IV access x 2 , Iv in place yet unable to obtain blood sample.

## 2021-05-05 NOTE — ED Triage Notes (Signed)
Lower abd pain , constipation , last BM last week per daughter . No relief despite laxatives , sees a GI .

## 2021-05-05 NOTE — Discharge Instructions (Signed)
Drink GoLytely solution that has been prescribed.  Recommend drinking may be 500 mL over the course of several hours today and titrate to effect as discussed.  Follow-up with your primary care doctor.

## 2021-07-07 ENCOUNTER — Ambulatory Visit: Payer: Medicare Other | Admitting: Neurology

## 2021-07-09 ENCOUNTER — Telehealth: Payer: Self-pay | Admitting: Neurology

## 2021-07-09 NOTE — Telephone Encounter (Addendum)
Called and spoke with daughter. Advised I spoke with Dr. Felecia Shelling who agreed to see her tomorrow. Scheduled work in visit for 2pm, asked they check in no later than 145p. She will make sure to bring her updated insurance cards/med list. She is aware to bring mother to ER if sx become severe for immediate eval/treatment.

## 2021-07-09 NOTE — Telephone Encounter (Signed)
Called daughter back. Mother had fall 2 wk ago/got deep cut on face. Went to hospital/got stitches. They checked urine. showed UTI and treated w/ antibiotics. Previously had pneumonia and treated w/ antibiotics. This past Sat, she woke up w/ diarrhea (thinking it may be r/t to her being on two antibiotics last week). Was having bowel incontinence.  She continues to be very confused. Mother knows her name. However, when using walker, she had to prompt her to grab handles. If she needs to sit, she has to prompt her to sit. Mother lives alone. Daughter lives two houses down from her. She fell again this past Monday night. Thinks she slipped. Had f/u w/ PCP who ordered CT scan (she completed yesterday) . Showed no bleeding. PCP/Dr. Edward JollySilva told her mother very dehydrated and asked that she drink Gatorade/Pedialyte. They f/u w/ her today and told her mother's Urine/culture negative but would like her to come back in for repeat labs. Daughter did not understand this. I explained they may be wanting to get updated labs to make sure there has not been any significant changes since last labs completed ( Example-looking for infection.) PCP did not test for flu/covid. Gave fluid pill for swollen legs/feet and told there was fluid in lungs.  They did give her Risperdal in the hospital. When she got out, Dr. Regino SchultzeWang wrote 30 days supply and told her to get further refills from neurologist.  She is not taking donepezil. States it makes her drowsy. Changes in behavior reminds her of episode of two years ago. Found she was being over medicated at that time and took her off some meds. This change is sudden and daughter concerned.  She is requesting to make appt asap w/ Dr. Epimenio FootSater to have her further evaluated. Aware I am going to discuss w/ MD and call her back.

## 2021-07-09 NOTE — Telephone Encounter (Signed)
[facility]) pt need urgent appt due to increase confusion;pt was rubbing a towel; daughter ask what she was doing pt said she was taking a picture, pt forgetting to take her medication. Please call the daughter to schedule a sooner appt.  Moise Boring, daughter's phone: [phone]

## 2021-07-10 ENCOUNTER — Ambulatory Visit: Payer: Medicare Other | Admitting: Neurology

## 2021-08-27 ENCOUNTER — Ambulatory Visit: Payer: Medicare Other | Admitting: Neurology

## 2021-08-28 ENCOUNTER — Emergency Department (HOSPITAL_BASED_OUTPATIENT_CLINIC_OR_DEPARTMENT_OTHER): Payer: Medicare Other

## 2021-08-28 ENCOUNTER — Other Ambulatory Visit: Payer: Self-pay

## 2021-08-28 ENCOUNTER — Emergency Department (HOSPITAL_BASED_OUTPATIENT_CLINIC_OR_DEPARTMENT_OTHER)
Admission: EM | Admit: 2021-08-28 | Discharge: 2021-08-28 | Disposition: A | Discharge status: Home / Self Care | Payer: Medicare Other | Attending: Emergency Medicine | Admitting: Emergency Medicine

## 2021-08-28 ENCOUNTER — Encounter (HOSPITAL_BASED_OUTPATIENT_CLINIC_OR_DEPARTMENT_OTHER): Payer: Self-pay | Admitting: Emergency Medicine

## 2021-08-28 DIAGNOSIS — R310 Gross hematuria: Secondary | ICD-10-CM | POA: Insufficient documentation

## 2021-08-28 DIAGNOSIS — I110 Hypertensive heart disease with heart failure: Secondary | ICD-10-CM | POA: Diagnosis not present

## 2021-08-28 DIAGNOSIS — I509 Heart failure, unspecified: Secondary | ICD-10-CM | POA: Diagnosis not present

## 2021-08-28 DIAGNOSIS — I2510 Atherosclerotic heart disease of native coronary artery without angina pectoris: Secondary | ICD-10-CM | POA: Insufficient documentation

## 2021-08-28 DIAGNOSIS — Z7901 Long term (current) use of anticoagulants: Secondary | ICD-10-CM | POA: Insufficient documentation

## 2021-08-28 DIAGNOSIS — K5900 Constipation, unspecified: Secondary | ICD-10-CM | POA: Insufficient documentation

## 2021-08-28 DIAGNOSIS — R319 Hematuria, unspecified: Secondary | ICD-10-CM | POA: Diagnosis present

## 2021-08-28 LAB — COMPREHENSIVE METABOLIC PANEL
ALT: 60 U/L — ABNORMAL HIGH (ref 0–44)
AST: 58 U/L — ABNORMAL HIGH (ref 15–41)
Albumin: 3.9 g/dL (ref 3.5–5.0)
Alkaline Phosphatase: 80 U/L (ref 38–126)
Anion gap: 12 (ref 5–15)
BUN: 20 mg/dL (ref 8–23)
CO2: 23 mmol/L (ref 22–32)
Calcium: 9.7 mg/dL (ref 8.9–10.3)
Chloride: 99 mmol/L (ref 98–111)
Creatinine, Ser: 0.79 mg/dL (ref 0.44–1.00)
GFR, Estimated: >60 mL/min (ref >60)
Glucose, Bld: 96 mg/dL (ref 70–99)
Potassium: 3.7 mmol/L (ref 3.5–5.1)
Sodium: 134 mmol/L — ABNORMAL LOW (ref 135–145)
Total Bilirubin: 0.4 mg/dL (ref 0.3–1.2)
Total Protein: 7.8 g/dL (ref 6.5–8.1)

## 2021-08-28 LAB — CBC WITH DIFFERENTIAL/PLATELET
Abs Immature Granulocytes: 0.01 10*3/uL (ref 0.00–0.07)
Basophils Absolute: 0 10*3/uL (ref 0.0–0.1)
Basophils Relative: 0 %
Eosinophils Absolute: 0.1 10*3/uL (ref 0.0–0.5)
Eosinophils Relative: 1 %
HCT: 36.3 % (ref 36.0–46.0)
Hemoglobin: 11.9 g/dL — ABNORMAL LOW (ref 12.0–15.0)
Immature Granulocytes: 0 %
Lymphocytes Relative: 12 %
Lymphs Abs: 1 10*3/uL (ref 0.7–4.0)
MCH: 30.3 pg (ref 26.0–34.0)
MCHC: 32.8 g/dL (ref 30.0–36.0)
MCV: 92.4 fL (ref 80.0–100.0)
Monocytes Absolute: 0.6 10*3/uL (ref 0.1–1.0)
Monocytes Relative: 8 %
Neutro Abs: 6.6 10*3/uL (ref 1.7–7.7)
Neutrophils Relative %: 79 %
Platelets: 195 10*3/uL (ref 150–400)
RBC: 3.93 MIL/uL (ref 3.87–5.11)
RDW: 13.4 % (ref 11.5–15.5)
WBC: 8.3 10*3/uL (ref 4.0–10.5)
nRBC: 0 % (ref 0.0–0.2)

## 2021-08-28 LAB — URINALYSIS, MICROSCOPIC (REFLEX): RBC / HPF: >50 RBC/hpf (ref 0–5)

## 2021-08-28 LAB — LIPASE, BLOOD: Lipase: 33 U/L (ref 11–51)

## 2021-08-28 LAB — URINALYSIS, ROUTINE W REFLEX MICROSCOPIC: Hgb urine dipstick: NEGATIVE

## 2021-08-28 MED ORDER — FENTANYL CITRATE PF 50 MCG/ML IJ SOSY
25.0000 ug | PREFILLED_SYRINGE | Freq: Once | INTRAMUSCULAR | Status: AC
  Administered 2021-08-28: 25 ug via INTRAVENOUS
  Filled 2021-08-28: qty 1

## 2021-08-28 MED ORDER — SODIUM CHLORIDE 0.9 % IV BOLUS
500.0000 mL | Freq: Once | INTRAVENOUS | Status: AC
  Administered 2021-08-28: 500 mL via INTRAVENOUS

## 2021-08-28 MED ORDER — SENNOSIDES-DOCUSATE SODIUM 8.6-50 MG PO TABS
1.0000 | ORAL_TABLET | Freq: Every evening | ORAL | 0 refills | Status: AC | PRN
  Filled 2021-08-28: qty 100, 100d supply, fill #0

## 2021-08-28 NOTE — ED Triage Notes (Signed)
Patient brought in by daughter c/o hematuria. Patient was seen by urology yesterday for increased urinary urgency, had catheter placed and drained her bladder. Daughter states pts caregiver has seen blood each time patient was wiped today and is unsure if it is from urine or if it is vaginal bleeding.  ?

## 2021-08-28 NOTE — ED Provider Notes (Signed)
? ?Emergency Department Provider Note ? ? ?I have reviewed the triage vital signs and the nursing notes. ? ? ?HISTORY ? ?Chief Complaint ?Hematuria ? ? ?HPI ?Sara Villanueva is a 76 y.o. female with PMH reviewed below presents to the ED with concern for blood seen in the toilet today along with abdominal pain.  Family at bedside provide most of the history.  They state that the patient was seen by her urologist yesterday and found to have urinary retention in the office.  Foley catheter was placed but then removed prior to discharge.  No sign of infection or blood in the urine at that time.  Patient returned home and today family have noted some worsening of her chronic abdominal pain as well as some bright red blood in the toilet water.  Patient denies any pain with urination.  Family states that she has been urinating normally today but on several occasions of seeing dark red blood in the toilet. No fever. Patient is on Plavix. Unknown if this bleeding is hematuria or vaginal bleeding.  ? ?Past Medical History:  ?Diagnosis Date  ? Arthritis   ? Bladder disorder   ? CAD (coronary artery disease)   ? stents x 3, 2013, 2017  ? Carotid arterial disease (HCC)   ? CHF (congestive heart failure) (HCC)   ? Colon polyps   ? Eczema   ? High cholesterol   ? Hypertension   ? Lower GI bleed   ? Rheumatoid arteritis (HCC)   ? Rheumatoid arthritis (HCC)   ? ? ?Review of Systems ? ?Constitutional: No fever/chills ?Eyes: No visual changes. ?ENT: No sore throat. ?Cardiovascular: Denies chest pain. ?Respiratory: Denies shortness of breath. ?Gastrointestinal: Positive abdominal pain.  No nausea, no vomiting.  No diarrhea.  No constipation. ?Genitourinary: Negative for dysuria. ? Hematuria.  ?Musculoskeletal: Negative for back pain. ?Skin: Negative for rash. ?Neurological: Negative for headaches, focal weakness or numbness. ? ? ?____________________________________________ ? ? ?PHYSICAL EXAM: ? ?VITAL SIGNS: ?ED Triage Vitals  ?Enc  Vitals Group  ?   BP 08/28/21 1553 (!) 175/99  ?   Pulse Rate 08/28/21 1553 99  ?   Resp 08/28/21 1553 16  ?   Temp 08/28/21 1553 (!) 97.5 ?F (36.4 ?C)  ?   Temp Source 08/28/21 1553 Oral  ?   SpO2 08/28/21 1553 100 %  ?   Weight 08/28/21 1550 120 lb (54.4 kg)  ?   Height 08/28/21 1550  (1.727 m)  ? ?Constitutional: Alert and oriented. No distress but frail appearing.  ?Eyes: Conjunctivae are normal.  ?Head: Atraumatic. ?Nose: No congestion/rhinnorhea. ?Mouth/Throat: Mucous membranes are slightly dry.  ?Neck: No stridor.  ?Cardiovascular: Mild tachycardia. Good peripheral circulation. Grossly normal heart sounds.   ?Respiratory: Normal respiratory effort.  No retractions. Lungs CTAB. ?Gastrointestinal: Soft and nontender. No distention.  ?Musculoskeletal: No lower extremity tenderness nor edema. No gross deformities of extremities. ?Neurologic:  Normal speech and language.  ?Skin:  Skin is warm, dry and intact. No rash noted. ? ?____________________________________________ ?  ?LABS ?(all labs ordered are listed, but only abnormal results are displayed) ? ?Labs Reviewed  ?URINE CULTURE - Abnormal; Notable for the following components:  ?    Result Value  ? Culture   (*)   ? Value: 70,000 COLONIES/mL ESCHERICHIA COLI ?Confirmed Extended Spectrum Beta-Lactamase Producer (ESBL).  In bloodstream infections from ESBL organisms, carbapenems are preferred over piperacillin/tazobactam. They are shown to have a lower risk of mortality. ?  ? Organism ID, Bacteria  ESCHERICHIA COLI (*)   ? All other components within normal limits  ?COMPREHENSIVE METABOLIC PANEL - Abnormal; Notable for the following components:  ? Sodium 134 (*)   ? AST 58 (*)   ? ALT 60 (*)   ? All other components within normal limits  ?CBC WITH DIFFERENTIAL/PLATELET - Abnormal; Notable for the following components:  ? Hemoglobin 11.9 (*)   ? All other components within normal limits  ?URINALYSIS, ROUTINE W REFLEX MICROSCOPIC - Abnormal; Notable for the  following components:  ? Color, Urine RED (*)   ? APPearance TURBID (*)   ? Glucose, UA   (*)   ? Value: TEST NOT REPORTED DUE TO COLOR INTERFERENCE OF URINE PIGMENT  ? Bilirubin Urine   (*)   ? Value: TEST NOT REPORTED DUE TO COLOR INTERFERENCE OF URINE PIGMENT  ? Ketones, ur   (*)   ? Value: TEST NOT REPORTED DUE TO COLOR INTERFERENCE OF URINE PIGMENT  ? Protein, ur   (*)   ? Value: TEST NOT REPORTED DUE TO COLOR INTERFERENCE OF URINE PIGMENT  ? Nitrite   (*)   ? Value: TEST NOT REPORTED DUE TO COLOR INTERFERENCE OF URINE PIGMENT  ? Leukocytes,Ua   (*)   ? Value: TEST NOT REPORTED DUE TO COLOR INTERFERENCE OF URINE PIGMENT  ? All other components within normal limits  ?URINALYSIS, MICROSCOPIC (REFLEX) - Abnormal; Notable for the following components:  ? Bacteria, UA RARE (*)   ? All other components within normal limits  ?LIPASE, BLOOD  ? ? ?____________________________________________ ? ? ?PROCEDURES ? ?Procedure(s) performed:  ? ?Procedures ? ?None ?____________________________________________ ? ? ?INITIAL IMPRESSION / ASSESSMENT AND PLAN / ED COURSE ? ?Pertinent labs & imaging results that were available during my care of the patient were reviewed by me and considered in my medical decision making (see chart for details). ?  ?This patient is Presenting for Evaluation of abdominal pain, which does require a range of treatment options, and is a complaint that involves a high risk of morbidity and mortality. ? ?The Differential Diagnoses includes ureteral stone, UTI, hematuria, vaginal bleeding.  ? ?Critical Interventions-  ?  ?Medications  ?sodium chloride 0.9 % bolus 500 mL (0 mLs Intravenous Stopped 08/28/21 1824)  ?fentaNYL (SUBLIMAZE) injection 25 mcg (25 mcg Intravenous Given 08/28/21 1702)  ?fentaNYL (SUBLIMAZE) injection 25 mcg (25 mcg Intravenous Given 08/28/21 1814)  ? ? ?Reassessment after intervention:  Pain improved.  ? ? ?I did obtain Additional Historical Information from daughter at bedside and  caregiver. ? ?I decided to review pertinent External Data, and in summary last ED visit was 05/05/21. ?  ?Clinical Laboratory Tests Ordered, included lipase negative. Mild LFT elevation. Normal bilirubin. No leukocytosis. No severe anemia.  ? ?Radiologic Tests Ordered, included CT renal scan. I independently interpreted the images and agree with radiology interpretation.  ? ?Cardiac Monitor Tracing which shows NSR ? ? ?Social Determinants of Health Risk denies ETOH or smoking.  ? ?Medical Decision Making: Summary:  ?Patient presents to the emergency department with abdominal pain with blood in the toilet water today.  Unclear if this is hematuria, vaginal bleeding, or GI bleeding.  Seems urinary based on timing of when symptoms are seen in the toilet bowl. Abdomen is non-tender. Plan for CT renal and IVF.  ? ?CT renal showing high degree of stool burden. No ureteral stone. No hydro. UA interpretation limited by blood. Doubt vaginal bleeding or GI bleeding in this setting. Will need close urology follow up. Plan  to send UA for culture. Constipation mgmt discussed along with Urology follow up.  ? ?Reevaluation with update and discussion with patient and family at bedside. Discussed ED return precautions.  ? ?Disposition: discharge ? ?____________________________________________ ? ?FINAL CLINICAL IMPRESSION(S) / ED DIAGNOSES ? ?Final diagnoses:  ?Gross hematuria  ?Constipation, unspecified constipation type  ? ? ? ?NEW OUTPATIENT MEDICATIONS STARTED DURING THIS VISIT: ? ?Discharge Medication List as of 08/28/2021  6:20 PM  ?  ? ?START taking these medications  ? Details  ?senna-docusate (SENOKOT-S) 8.6-50 MG tablet Take 1 tablet by mouth at bedtime as needed for moderate constipation., Starting Thu 08/28/2021, Normal  ?  ?  ? ? ?Note:  This document was prepared using Dragon voice recognition software and may include unintentional dictation errors. ? ?Alona Bene, MD, FACEP ?Emergency Medicine ? ?  ?Maia Plan,  MD ?09/02/21 0820 ? ?

## 2021-08-28 NOTE — Discharge Instructions (Signed)
You were seen in the emergency room today with abdominal pain and blood in the urine.  I wonder if some trauma may have caused bleeding after the catheter was placed yesterday.  Please follow with your urologist in the next 2 days.  If you develop fever, worsening abdominal pain or inability to urinate you should seek medical attention immediately. ?

## 2021-08-29 ENCOUNTER — Other Ambulatory Visit (HOSPITAL_BASED_OUTPATIENT_CLINIC_OR_DEPARTMENT_OTHER): Payer: Self-pay

## 2021-08-30 LAB — URINE CULTURE: Culture: 70000 — AB

## 2021-08-31 ENCOUNTER — Telehealth (HOSPITAL_BASED_OUTPATIENT_CLINIC_OR_DEPARTMENT_OTHER): Payer: Self-pay | Admitting: *Deleted

## 2021-08-31 NOTE — Telephone Encounter (Signed)
Post ED Visit - Positive Culture Follow-up ? ?Culture report reviewed by antimicrobial stewardship pharmacist: ?[facility] ?[]  Enzo BiNathan Batchelder, [address] ?[]  Celedonio MiyamotoJeremy Frens, Pharm.D., BCPS AQ-ID ?[]  Garvin FilaMike Maccia, Pharm.D., BCPS ?[]  Georgina PillionElizabeth Martin, Pharm.D., BCPS ?[]  CrookstonMinh Pham, [address] BCPS, AAHIVP ?[]  Estella HuskMichelle Turner, Pharm.D., BCPS, AAHIVP ?[]  Lysle Pearlachel Rumbarger, PharmD, BCPS ?[]  Phillips Climeshuy Dang, PharmD, BCPS ?[]  Agapito GamesAlison Masters, PharmD, BCPS ?[]  Verlan FriendsErin Deja, PharmD ?[]  Mervyn Gayatherine Pierce, PharmD, BCPS ?[x] Daylene PoseyJonathan Oriet PharmD ? ?[facility] ?[]  Len ChildsMichelle Bell, PharmD ?[]  Greer PickerelJigna Gadhia, PharmD ?[]  Adalberto ColeNikola Glogovac, PharmD ?[]  Perlie Golderri Green, Rph ?[]  Lonell Faceachel (Ellen) Jean RosenthalJackson, PharmD ?[]  Earl ManyJustin Legge, PharmD ?[]  Junita PushMichelle Lilliston, PharmD ?[]  Dorna LeitzAnh Pham, PharmD ?[]  Terrilee FilesLeann Poindexter, PharmD ?[]  Lynann Beaverhristine Shade, PharmD ?[]  Keturah BarreMary Swayne, PharmD ?[]  Loralee PacasErin Williamson, PharmD ?[]  Bernadene Personrew Wofford, PharmD ? ? ?Positive urine culture ? no further patient follow-up is required at this time per Lowanda FosterMindy Brewer, NP ? ?Patsey BertholdLemons, Tatum Corl King ?08/31/2021, 4:48 PM ?  ?

## 2021-09-16 ENCOUNTER — Ambulatory Visit: Payer: Medicare Other | Admitting: Neurology

## 2021-09-22 ENCOUNTER — Telehealth: Payer: Self-pay | Admitting: Neurology

## 2021-09-22 NOTE — Telephone Encounter (Signed)
Baird Lyons, nurse navigator on behalf of PCP Dr. Regino Schultze calling concerning getting pt appt with Dr. Epimenio Foot.  ?She would like a call back from the nurse to see if pt can get an earlier appt than what is available.  ?Baird Lyons- [phone] ?

## 2021-09-22 NOTE — Telephone Encounter (Signed)
Called back. Got automated system. LVM asking for return call ? ?If they call, please offer appt for 09/29/21 at 9am with Dr. Epimenio FootSater  ?

## 2021-09-22 NOTE — Telephone Encounter (Signed)
Tried calling again, got automated system again. ?

## 2021-09-23 NOTE — Telephone Encounter (Signed)
Left another VM asking for call back. ?

## 2021-09-23 NOTE — Telephone Encounter (Signed)
Sara Villanueva called, requesting that we call pt daughter, Sara Villanueva to discuss date of appt.  ?Called daughter at [phone] LVM asking her to call us back.  ?

## 2021-09-23 NOTE — Telephone Encounter (Signed)
Called the daughter back. She was confused because she spoke with the coordinator Myriam Jacobson about the pt having difficulty with sleep and wanted to have her PCP recommend something to help with sleep.  ?The PCP deferred her coming to see Dr Felecia Shelling for this concern.  ?The daughter thought this would need to come from PCP. I advised that is typically correct. I did inform that the patient is due for a an appt anyway and if she is ok we can go ahead and get her scheduled and have her come in for follow up and discuss sleep concerns. We scheduled her for 10/15/21 at 2 pm and advised since she is a memory it would need to be check in at 1:30 p to allow time for [place] to do a visit. ?

## 2021-10-15 ENCOUNTER — Ambulatory Visit: Payer: Medicare Other | Admitting: Neurology

## 2021-10-29 ENCOUNTER — Encounter: Payer: Self-pay | Admitting: Neurology

## 2021-10-29 ENCOUNTER — Ambulatory Visit (INDEPENDENT_AMBULATORY_CARE_PROVIDER_SITE_OTHER): Payer: Medicare Other | Admitting: Neurology

## 2021-10-29 ENCOUNTER — Ambulatory Visit: Payer: Medicare Other | Admitting: Neurology

## 2021-10-29 VITALS — BP 108/59 | HR 86 | Ht 68.0 in | Wt 97.0 lb

## 2021-10-29 DIAGNOSIS — M5416 Radiculopathy, lumbar region: Secondary | ICD-10-CM | POA: Diagnosis not present

## 2021-10-29 DIAGNOSIS — H814 Vertigo of central origin: Secondary | ICD-10-CM

## 2021-10-29 DIAGNOSIS — F03B0 Unspecified dementia, moderate, without behavioral disturbance, psychotic disturbance, mood disturbance, and anxiety: Secondary | ICD-10-CM | POA: Diagnosis not present

## 2021-10-29 DIAGNOSIS — I6529 Occlusion and stenosis of unspecified carotid artery: Secondary | ICD-10-CM | POA: Diagnosis not present

## 2021-10-29 DIAGNOSIS — G4700 Insomnia, unspecified: Secondary | ICD-10-CM

## 2021-10-29 MED ORDER — DONEPEZIL HCL 10 MG PO TABS: ORAL_TABLET | ORAL | 3 refills | Status: AC

## 2021-10-29 MED ORDER — BELSOMRA 10 MG PO TABS
ORAL_TABLET | ORAL | 5 refills | Status: DC
Start: 2021-10-29 — End: 2021-11-13

## 2021-10-29 NOTE — Progress Notes (Signed)
[facility]  PATIENT:  Sara Villanueva DOB: 02/02/1946  REFERRING DOCTOR OR PCP:  Sara Villanueva SOURCE: patient  _________________________________   HISTORICAL  CHIEF COMPLAINT:  Chief Complaint  Patient presents with   Follow-up    Rm 1, w daughter and caregiver. Pt has not been sleeping well.     HISTORY OF PRESENT ILLNESS:  She is a 76 yo woman with chronic lower back pain and memory loss  Update  10/29/2021 She was hospitalized in APril 2023 for a UTI that became urosepsis.     She has GI issues.  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  Some foods cause stomach upset.   She is on dicyclomine (Bentyl) for cramps.  It does help her GI issues.  She has lost 20 pounds - now under 100 pounds.   She continues to experience memory loss that is mostly stable.  She is forgetful and misplaces items.  She stopped donepezil due to GI upset.   She saw GI and amitriptyline was started for irritanle bowel/cramps.   Dicyclomine had previously been started for cramps.   She is mre quie and apathetic.   No speech issues.   No behavioral issues.      Vertigo is not happening anymore.   MRI 2022 showed no IAC issue.    She is sleeping poorly most nights.  Amitriptyline (was for GI) did not help.     No more episodes of near-syncope.   She has carotid arterial disease and underwent right carotid endarterectomy July 2019.  She follows up with vascular surgery.  She also has had bilateral renal artery stents and has coronary artery disease.  Vascular risk factors include hyperlipidemia and HTN.      She had two stents placed in arteries for the GI system.  She is on Plavix (was on DAPT but aspirin was d/c).  She no longer exercises with a treadmill.          01/01/2021   11:36 AM 01/01/2021   11:07 AM 03/21/2019    1:39 PM  Montreal Cognitive Assessment   Visuospatial/ Executive (0/5) 0  1  Naming (0/3) 3 3 3   Attention: Read list of digits (0/2) 2 2 1    Attention: Read list of letters (0/1) 0 0 1  Attention: Serial 7 subtraction starting at 100 (0/3) 1 1 1   Language: Repeat phrase (0/2) 2 2 2   Language : Fluency (0/1) 0 0 0  Abstraction (0/2) 1 1 1   Delayed Recall (0/5) 0 0 2  Orientation (0/6) 6 6 6   Total 15  18  Adjusted Score (based on education) 16  19      MRI of the brain 01/18/2021 with added attention to the internal auditory canal showed 1.   The internal auditory canals and contents appeared normal before and after contrast. 2.   Mild generalized cortical atrophy, stable compared to the 10/11/2019 MRI. 3.   Scattered T2/FLAIR hyperintense foci in the hemispheres and pons consistent with mild to moderate chronic microvascular ischemic change.  Stable compared to the 10/11/2019 MRI. 4.   No acute findings.  Normal enhancement pattern.   MRI lumbar 05/2017 showed  spondylolisthesis of L5 upon S1 associated with facet hypertrophy and disc bulging of the uncovered disc. There is moderately severe left foraminal narrowing with possible left L5 nerve root compression. Additionally there is moderate bilateral lateral recess stenosis. No nerve root impingement at other levels.    REVIEW OF  SYSTEMS: Constitutional: No fevers, chills, sweats, or change in appetite Eyes: No visual changes, double vision, eye pain Ear, nose and throat: No hearing loss, ear pain, nasal congestion, sore throat Cardiovascular: No chest pain, palpitations Respiratory:  No shortness of breath at rest or with exertion.   No wheezes GastrointestinaI: No nausea, vomiting, diarrhea, abdominal pain, fecal incontinence Genitourinary:  No dysuria, urinary retention or frequency.  No nocturia. Musculoskeletal:  Mild neck pain; moderate back pain and left hip pain Integumentary: No rash, pruritus, skin lesions Neurological: as above Psychiatric: No depression at this time.  No anxiety Endocrine: No palpitations, diaphoresis, change in appetite, change in weigh or  increased thirst Hematologic/Lymphatic:  No anemia, purpura, petechiae. Allergic/Immunologic: No itchy/runny eyes, nasal congestion, recent allergic reactions, rashes  ALLERGIES: Allergies  Allergen Reactions   Other Other (See Comments)    Prefers paper tape, allergic to tree pollen   Lamotrigine Rash   Latex Other (See Comments) and Rash    unknown    Nickel Rash    HOME MEDICATIONS:  Current Outpatient Medications:    amitriptyline (ELAVIL) 25 MG tablet, Take 2 tablets by mouth at bedtime., Disp: , Rfl:    ascorbic acid (VITAMIN C) 100 MG tablet, Take by mouth., Disp: , Rfl:    aspirin EC 81 MG tablet, Take 1 tablet by mouth daily., Disp: , Rfl:    clopidogrel (PLAVIX) 75 MG tablet, Take 75 mg by mouth daily., Disp: , Rfl:    dicyclomine (BENTYL) 20 MG tablet, Take 20 mg by mouth 3 (three) times daily before meals., Disp: , Rfl:    hydroxychloroquine (PLAQUENIL) 200 MG tablet, Take 200 mg by mouth daily., Disp: , Rfl:    NITROSTAT 0.4 MG SL tablet, Place 0.4 mg under the tongue every 5 (five) minutes as needed for chest pain. , Disp: , Rfl:    pantoprazole (PROTONIX) 40 MG tablet, Take 40 mg by mouth daily., Disp: , Rfl:    potassium chloride (KLOR-CON) 10 MEQ tablet, Take 10 mEq by mouth daily., Disp: , Rfl:    rosuvastatin (CRESTOR) 20 MG tablet, Take 20 mg by mouth daily., Disp: , Rfl:    senna-docusate (SENOKOT-S) 8.6-50 MG tablet, Take 1 tablet by mouth at bedtime as needed for moderate constipation., Disp: 100 tablet, Rfl: 0   Suvorexant (BELSOMRA) 10 MG TABS, Take one po qHS, Disp: 30 tablet, Rfl: 5   torsemide (DEMADEX) 10 MG tablet, Take 20 mg by mouth as needed., Disp: , Rfl:    donepezil (ARICEPT) 10 MG tablet, Take 1/2 to 1 pill po qHS, Disp: 90 tablet, Rfl: 3  PAST MEDICAL HISTORY: Past Medical History:  Diagnosis Date   Arthritis    Bladder disorder    CAD (coronary artery disease)    stents x 3, 2013, 2017   Carotid arterial disease (HCC)    CHF (congestive  heart failure) (HCC)    Colon polyps    Eczema    High cholesterol    Hypertension    Lower GI bleed    Rheumatoid arteritis (HCC)    Rheumatoid arthritis (HCC)     PAST SURGICAL HISTORY: Past Surgical History:  Procedure Laterality Date   CAROTID ENDARTERECTOMY     CORONARY ANGIOPLASTY WITH STENT PLACEMENT      FAMILY HISTORY: Family History  Problem Relation Age of Onset   Heart disease Mother    Heart disease Father    Allergic rhinitis Sister    Asthma Neg Hx    Eczema  Neg Hx    Urticaria Neg Hx    Immunodeficiency Neg Hx    Angioedema Neg Hx     SOCIAL HISTORY:  Social History   Socioeconomic History   Marital status: Widowed    Spouse name: Not on file   Number of children: Not on file   Years of education: Not on file   Highest education level: Not on file  Occupational History   Not on file  Tobacco Use   Smoking status: Former    Packs/day: 0.50    Years: 25.00    Pack years: 12.50    Types: Cigarettes    Quit date: 2000    Years since quitting: 23.4   Smokeless tobacco: Never  Vaping Use   Vaping Use: Never used  Substance and Sexual Activity   Alcohol use: No    Alcohol/week: 0.0 standard drinks   Drug use: No   Sexual activity: Not on file  Other Topics Concern   Not on file  Social History Narrative   Not on file   Social Determinants of Health   Financial Resource Strain: Not on file  Food Insecurity: Not on file  Transportation Needs: Not on file  Physical Activity: Not on file  Stress: Not on file  Social Connections: Not on file  Intimate Partner Violence: Not on file     PHYSICAL EXAM  Vitals:   10/29/21 1059  BP: (!) 108/59  Pulse: 86  Weight: 97 lb (44 kg)  Height:  (1.727 m)     Body mass index is 14.75 kg/m.   General: The patient is well-developed and well-nourished and in no acute distress.  Tympanic membranes intact  Skin/Musculoskeletal:    There is mild pedal edema.  No rashes.    Neurologic  Exam  Mental status: The patient is alert and oriented x 1 at the time of the examination.  Reduced short-term memory and focus (cannot spell world backwards or serial 7s).    5+7=12.    reduced left right.      Cranial nerves: Extraocular movements are full.    Facial strength and sensation is normal.  Hearing is normal and appears to be symmetric.  The Weber test did not lateralize.  Motor:  Muscle bulk is normal.   Tone is normal. Strength is 5/5 in the arms and legs except for 4+/5 EHL strength on the left..   Sensory: She has intact sensation to touch and vibration in the hands, including the fingertips. In the legs, she has symmetric vibration sensation.  There is reduced sensation in the L5 distribution on the left.  Gait and station: Station is normal.   The gait is arthritic and wide.   She uses a walker. There is no Romberg sign.   Reflexes: Deep tendon reflexes are symmetric and normal bilaterally.     Maneuvers: The Dix-Hallpike maneuver did not evoked nystagmus to either direction.  However, she did have mild vertigo with the move was done.    DIAGNOSTIC DATA (LABS, IMAGING, TESTING) - I reviewed patient records, labs, notes, testing and imaging myself where available.  Lab Results  Component Value Date   WBC 8.3 08/28/2021   HGB 11.9 (L) 08/28/2021   HCT 36.3 08/28/2021   MCV 92.4 08/28/2021   PLT 195 08/28/2021      Component Value Date/Time   NA 134 (L) 08/28/2021 1647   K 3.7 08/28/2021 1647   CL 99 08/28/2021 1647   CO2 23 08/28/2021  1647   GLUCOSE 96 08/28/2021 1647   BUN 20 08/28/2021 1647   CREATININE 0.79 08/28/2021 1647   CALCIUM 9.7 08/28/2021 1647   PROT 7.8 08/28/2021 1647   PROT 6.3 04/09/2021 1420   ALBUMIN 3.9 08/28/2021 1647   AST 58 (H) 08/28/2021 1647   ALT 60 (H) 08/28/2021 1647   ALKPHOS 80 08/28/2021 1647   BILITOT 0.4 08/28/2021 1647   GFRNONAA >60 08/28/2021 1647   GFRAA >60 11/04/2019 1326       ASSESSMENT AND PLAN  Moderate  dementia without behavioral disturbance, psychotic disturbance, mood disturbance, or anxiety, unspecified dementia type (HCC)  Stenosis of carotid artery, unspecified laterality  Lumbar radiculopathy  Vertigo of central origin  Insomnia, unspecified type   1.  She has dementia.  The MRI of the brain did not show a specific atrophy pattern.  Alzheimer's is possible but not definite.  Stop amitriptyline as it may worsen cognition.  I would also like her to stop the dicyclomine as it is also an anticholinergic but she reports that it helps her GI cramps a lot and she does not want to stop..   2.   Restart donepezil.  2.   For insomnia, try Belsomra (FDA approved for insomnia associated with Alzheimer's disease)- if not better try hydroxyzine or  quetiapine.  3.   return to clinic in 12 months or sooner based on results or if there are new or worsening neurologic symptoms.   Nadra Hritz A. Epimenio Foot, MD, PhD 10/29/2021, 4:56 PM Certified in Neurology, Clinical Neurophysiology, Sleep Medicine, Pain Medicine and Neuroimaging  [facility] [address], [address] [employer] [employer] [place], [place] [address] [phone]

## 2021-11-12 ENCOUNTER — Telehealth: Payer: Self-pay | Admitting: Neurology

## 2021-11-12 NOTE — Telephone Encounter (Signed)
Called daughter back. Belsomra ineffective. Also, cost is 192.00. First week mother took medication, it seemed to help. After that, she went back to not sleeping.  Per Dr. Bonnita Hollow last night: "For insomnia, try Belsomra (FDA approved for insomnia associated with Alzheimer's disease)- if not better try hydroxyzine or quetiapine."  Caregiver states she will lay down but up after an hour. Up several times a night. Does not nap during the day. She has tried hydroxyzine for eczema via dermatologist she found out. Helped her itching but never helped sleep. Does not want to try this alternate option. Did research on quetiapine, saw helped anxiety but not sleep. Did advise off label use could help with sleep. She has caregiver stay with her at night. Concerned about her not getting sleep.   She is open to seeing if Dr. Epimenio Foot would recommend trial of quetiapine instead. Wants to know dose/directions or if he would recommend increased dose of Belsomra? She would prefer trial of quetiapine first since cost of Belsomra high. If quetiapine ineffective, would be willing to go back to Belsomra if needed.

## 2021-11-12 NOTE — Telephone Encounter (Signed)
Pt's daughter is requesting a call back to discuss the pt's Suvorexant (BELSOMRA) 10 MG TABS. She states that the medication is not working and would like to discuss options. Please advise.

## 2021-11-13 MED ORDER — QUETIAPINE FUMARATE 25 MG PO TABS: 25.0000 mg | ORAL_TABLET | Freq: Every day | ORAL | 5 refills | Status: AC

## 2021-11-13 NOTE — Telephone Encounter (Signed)
Called and spoke w/ daughter. Relayed Dr. Bonnita Hollow recommendations. She is agreeable to plan. She will have her d/c Belsomra and try quetiapine. I e-scribed to pharmacy. She will call if she has any more questions/concerns moving forward.

## 2021-12-14 ENCOUNTER — Encounter (HOSPITAL_BASED_OUTPATIENT_CLINIC_OR_DEPARTMENT_OTHER): Payer: Self-pay | Admitting: Emergency Medicine

## 2021-12-14 ENCOUNTER — Emergency Department (HOSPITAL_BASED_OUTPATIENT_CLINIC_OR_DEPARTMENT_OTHER)
Admission: EM | Admit: 2021-12-14 | Discharge: 2021-12-14 | Discharge status: Against Medical Advice | Payer: Medicare Other | Attending: Emergency Medicine | Admitting: Emergency Medicine

## 2021-12-14 DIAGNOSIS — R410 Disorientation, unspecified: Secondary | ICD-10-CM | POA: Diagnosis present

## 2021-12-14 DIAGNOSIS — Z5321 Procedure and treatment not carried out due to patient leaving prior to being seen by health care provider: Secondary | ICD-10-CM | POA: Insufficient documentation

## 2021-12-14 DIAGNOSIS — R4182 Altered mental status, unspecified: Secondary | ICD-10-CM | POA: Insufficient documentation

## 2021-12-14 NOTE — ED Triage Notes (Signed)
Per pt family, pt has been confused today. Family is concerned if pt has a UTI. Pt has indwelling catheter. Family would also like wounds on both groin checked from procedure last month.

## 2021-12-27 DIAGNOSIS — J9621 Acute and chronic respiratory failure with hypoxia: Secondary | ICD-10-CM | POA: Diagnosis not present

## 2021-12-27 DIAGNOSIS — Z930 Tracheostomy status: Secondary | ICD-10-CM

## 2021-12-27 DIAGNOSIS — R6521 Severe sepsis with septic shock: Secondary | ICD-10-CM

## 2021-12-27 DIAGNOSIS — J90 Pleural effusion, not elsewhere classified: Secondary | ICD-10-CM | POA: Diagnosis not present

## 2021-12-28 DIAGNOSIS — J9621 Acute and chronic respiratory failure with hypoxia: Secondary | ICD-10-CM | POA: Diagnosis not present

## 2021-12-28 DIAGNOSIS — R6521 Severe sepsis with septic shock: Secondary | ICD-10-CM | POA: Diagnosis not present

## 2021-12-28 DIAGNOSIS — J90 Pleural effusion, not elsewhere classified: Secondary | ICD-10-CM | POA: Diagnosis not present

## 2021-12-28 DIAGNOSIS — Z930 Tracheostomy status: Secondary | ICD-10-CM | POA: Diagnosis not present

## 2021-12-29 DIAGNOSIS — R6521 Severe sepsis with septic shock: Secondary | ICD-10-CM

## 2021-12-29 DIAGNOSIS — Z930 Tracheostomy status: Secondary | ICD-10-CM

## 2021-12-29 DIAGNOSIS — J9621 Acute and chronic respiratory failure with hypoxia: Secondary | ICD-10-CM

## 2021-12-29 DIAGNOSIS — J90 Pleural effusion, not elsewhere classified: Secondary | ICD-10-CM | POA: Diagnosis not present

## 2021-12-30 DIAGNOSIS — J9621 Acute and chronic respiratory failure with hypoxia: Secondary | ICD-10-CM | POA: Diagnosis not present

## 2021-12-30 DIAGNOSIS — R6521 Severe sepsis with septic shock: Secondary | ICD-10-CM | POA: Diagnosis not present

## 2021-12-30 DIAGNOSIS — J90 Pleural effusion, not elsewhere classified: Secondary | ICD-10-CM | POA: Diagnosis not present

## 2021-12-30 DIAGNOSIS — Z930 Tracheostomy status: Secondary | ICD-10-CM | POA: Diagnosis not present

## 2021-12-31 DIAGNOSIS — J90 Pleural effusion, not elsewhere classified: Secondary | ICD-10-CM | POA: Diagnosis not present

## 2021-12-31 DIAGNOSIS — R6521 Severe sepsis with septic shock: Secondary | ICD-10-CM | POA: Diagnosis not present

## 2021-12-31 DIAGNOSIS — J9621 Acute and chronic respiratory failure with hypoxia: Secondary | ICD-10-CM | POA: Diagnosis not present

## 2021-12-31 DIAGNOSIS — Z930 Tracheostomy status: Secondary | ICD-10-CM | POA: Diagnosis not present

## 2022-01-01 DIAGNOSIS — Z930 Tracheostomy status: Secondary | ICD-10-CM | POA: Diagnosis not present

## 2022-01-01 DIAGNOSIS — J90 Pleural effusion, not elsewhere classified: Secondary | ICD-10-CM | POA: Diagnosis not present

## 2022-01-01 DIAGNOSIS — R6521 Severe sepsis with septic shock: Secondary | ICD-10-CM | POA: Diagnosis not present

## 2022-01-01 DIAGNOSIS — J9621 Acute and chronic respiratory failure with hypoxia: Secondary | ICD-10-CM | POA: Diagnosis not present

## 2022-01-02 DIAGNOSIS — J90 Pleural effusion, not elsewhere classified: Secondary | ICD-10-CM | POA: Diagnosis not present

## 2022-01-02 DIAGNOSIS — R6521 Severe sepsis with septic shock: Secondary | ICD-10-CM | POA: Diagnosis not present

## 2022-01-02 DIAGNOSIS — J9621 Acute and chronic respiratory failure with hypoxia: Secondary | ICD-10-CM | POA: Diagnosis not present

## 2022-01-02 DIAGNOSIS — Z930 Tracheostomy status: Secondary | ICD-10-CM | POA: Diagnosis not present

## 2022-01-03 DIAGNOSIS — Z930 Tracheostomy status: Secondary | ICD-10-CM | POA: Diagnosis not present

## 2022-01-03 DIAGNOSIS — J90 Pleural effusion, not elsewhere classified: Secondary | ICD-10-CM | POA: Diagnosis not present

## 2022-01-03 DIAGNOSIS — J9621 Acute and chronic respiratory failure with hypoxia: Secondary | ICD-10-CM | POA: Diagnosis not present

## 2022-01-03 DIAGNOSIS — R6521 Severe sepsis with septic shock: Secondary | ICD-10-CM | POA: Diagnosis not present

## 2022-01-04 DIAGNOSIS — Z930 Tracheostomy status: Secondary | ICD-10-CM | POA: Diagnosis not present

## 2022-01-04 DIAGNOSIS — J9621 Acute and chronic respiratory failure with hypoxia: Secondary | ICD-10-CM | POA: Diagnosis not present

## 2022-01-04 DIAGNOSIS — R6521 Severe sepsis with septic shock: Secondary | ICD-10-CM | POA: Diagnosis not present

## 2022-01-04 DIAGNOSIS — J90 Pleural effusion, not elsewhere classified: Secondary | ICD-10-CM | POA: Diagnosis not present

## 2022-01-12 DIAGNOSIS — R6521 Severe sepsis with septic shock: Secondary | ICD-10-CM | POA: Diagnosis not present

## 2022-01-12 DIAGNOSIS — Z930 Tracheostomy status: Secondary | ICD-10-CM | POA: Diagnosis not present

## 2022-01-12 DIAGNOSIS — J90 Pleural effusion, not elsewhere classified: Secondary | ICD-10-CM | POA: Diagnosis not present

## 2022-01-12 DIAGNOSIS — J9621 Acute and chronic respiratory failure with hypoxia: Secondary | ICD-10-CM | POA: Diagnosis not present

## 2022-01-13 DIAGNOSIS — Z930 Tracheostomy status: Secondary | ICD-10-CM | POA: Diagnosis not present

## 2022-01-13 DIAGNOSIS — R6521 Severe sepsis with septic shock: Secondary | ICD-10-CM | POA: Diagnosis not present

## 2022-01-13 DIAGNOSIS — J90 Pleural effusion, not elsewhere classified: Secondary | ICD-10-CM | POA: Diagnosis not present

## 2022-01-13 DIAGNOSIS — J9621 Acute and chronic respiratory failure with hypoxia: Secondary | ICD-10-CM | POA: Diagnosis not present

## 2022-01-14 DIAGNOSIS — J9621 Acute and chronic respiratory failure with hypoxia: Secondary | ICD-10-CM | POA: Diagnosis not present

## 2022-01-14 DIAGNOSIS — J90 Pleural effusion, not elsewhere classified: Secondary | ICD-10-CM | POA: Diagnosis not present

## 2022-01-14 DIAGNOSIS — Z930 Tracheostomy status: Secondary | ICD-10-CM | POA: Diagnosis not present

## 2022-01-14 DIAGNOSIS — R6521 Severe sepsis with septic shock: Secondary | ICD-10-CM | POA: Diagnosis not present

## 2022-01-22 DIAGNOSIS — J90 Pleural effusion, not elsewhere classified: Secondary | ICD-10-CM | POA: Diagnosis not present

## 2022-01-22 DIAGNOSIS — Z930 Tracheostomy status: Secondary | ICD-10-CM | POA: Diagnosis not present

## 2022-01-22 DIAGNOSIS — J9621 Acute and chronic respiratory failure with hypoxia: Secondary | ICD-10-CM | POA: Diagnosis not present

## 2022-01-22 DIAGNOSIS — R6521 Severe sepsis with septic shock: Secondary | ICD-10-CM | POA: Diagnosis not present

## 2022-01-23 DIAGNOSIS — Z930 Tracheostomy status: Secondary | ICD-10-CM | POA: Diagnosis not present

## 2022-01-23 DIAGNOSIS — R6521 Severe sepsis with septic shock: Secondary | ICD-10-CM | POA: Diagnosis not present

## 2022-01-23 DIAGNOSIS — J9621 Acute and chronic respiratory failure with hypoxia: Secondary | ICD-10-CM | POA: Diagnosis not present

## 2022-01-23 DIAGNOSIS — J90 Pleural effusion, not elsewhere classified: Secondary | ICD-10-CM | POA: Diagnosis not present

## 2022-01-24 DIAGNOSIS — J9621 Acute and chronic respiratory failure with hypoxia: Secondary | ICD-10-CM | POA: Diagnosis not present

## 2022-01-24 DIAGNOSIS — Z930 Tracheostomy status: Secondary | ICD-10-CM | POA: Diagnosis not present

## 2022-01-24 DIAGNOSIS — J90 Pleural effusion, not elsewhere classified: Secondary | ICD-10-CM | POA: Diagnosis not present

## 2022-01-24 DIAGNOSIS — R6521 Severe sepsis with septic shock: Secondary | ICD-10-CM | POA: Diagnosis not present

## 2022-09-09 IMAGING — MG DIGITAL SCREENING BILAT W/ TOMO W/ CAD
6 of 12 series · 6 of 36 positions shown · non-contrast
Comparison: Previous exam(s).

CLINICAL DATA: Screening.

EXAM:
DIGITAL SCREENING BILATERAL MAMMOGRAM WITH TOMO AND CAD

[L CC synth-2D]
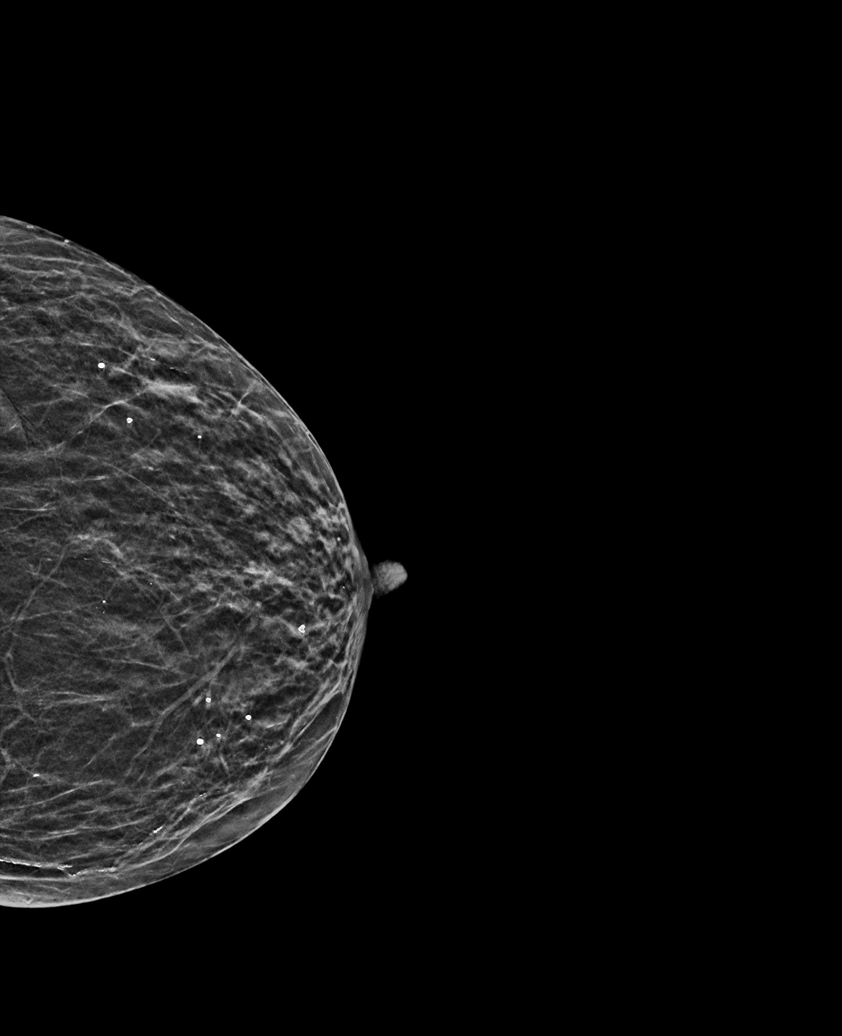

[R MLO synth-2D]
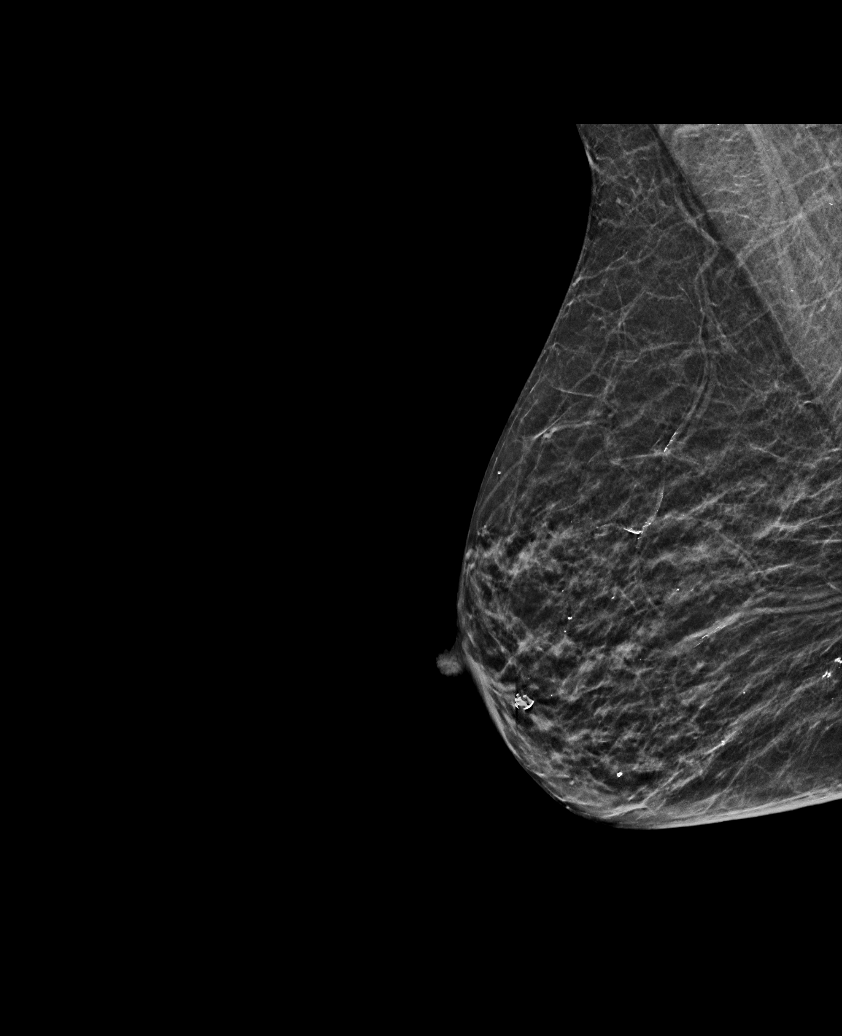

[R CC synth-2D]
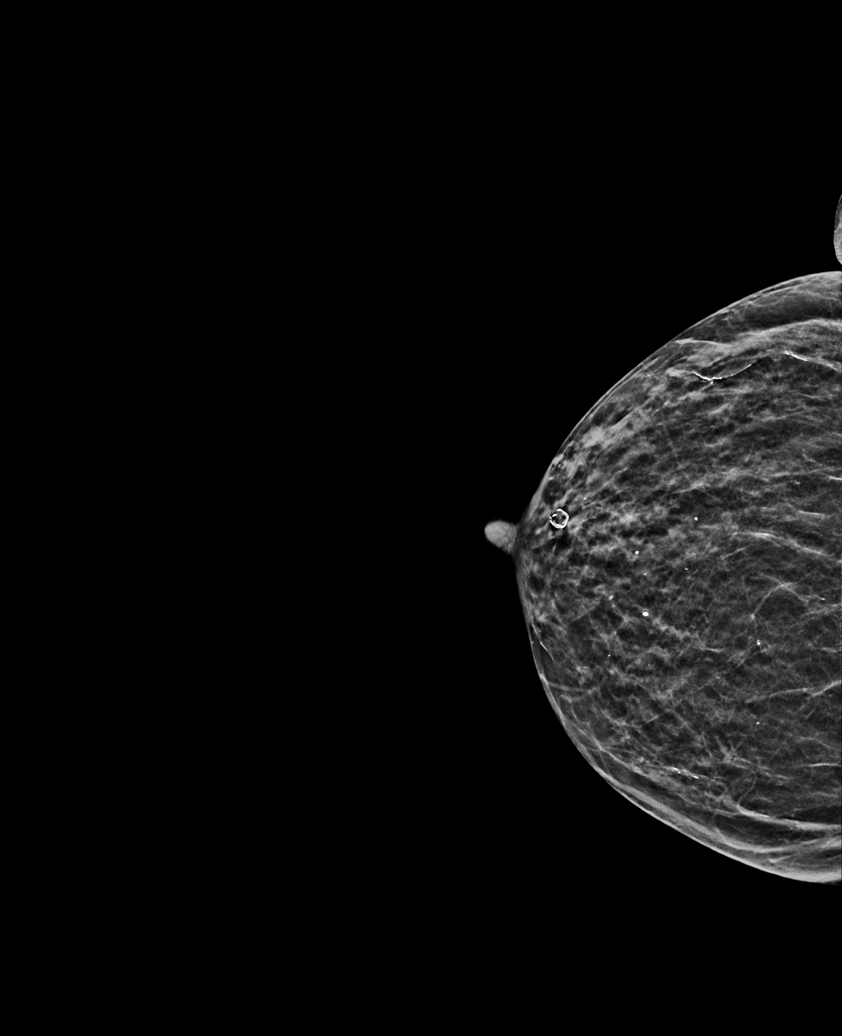

[R XCCL synth-2D]
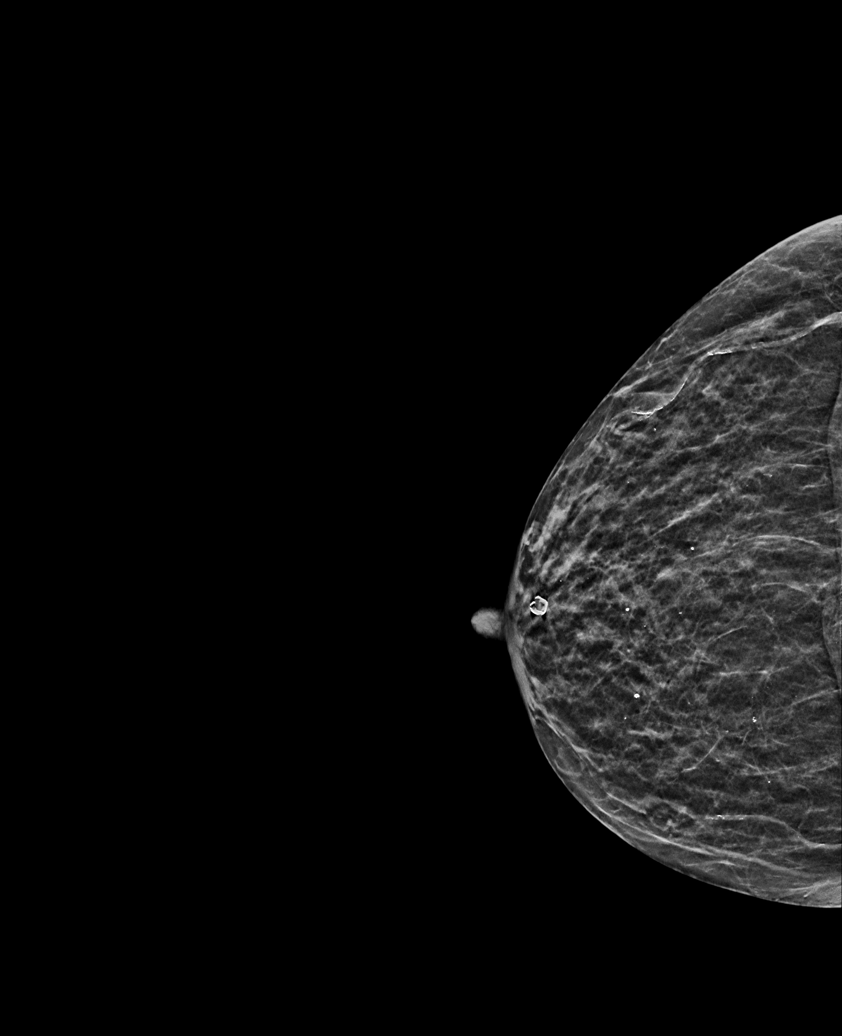

[L XCCL synth-2D]
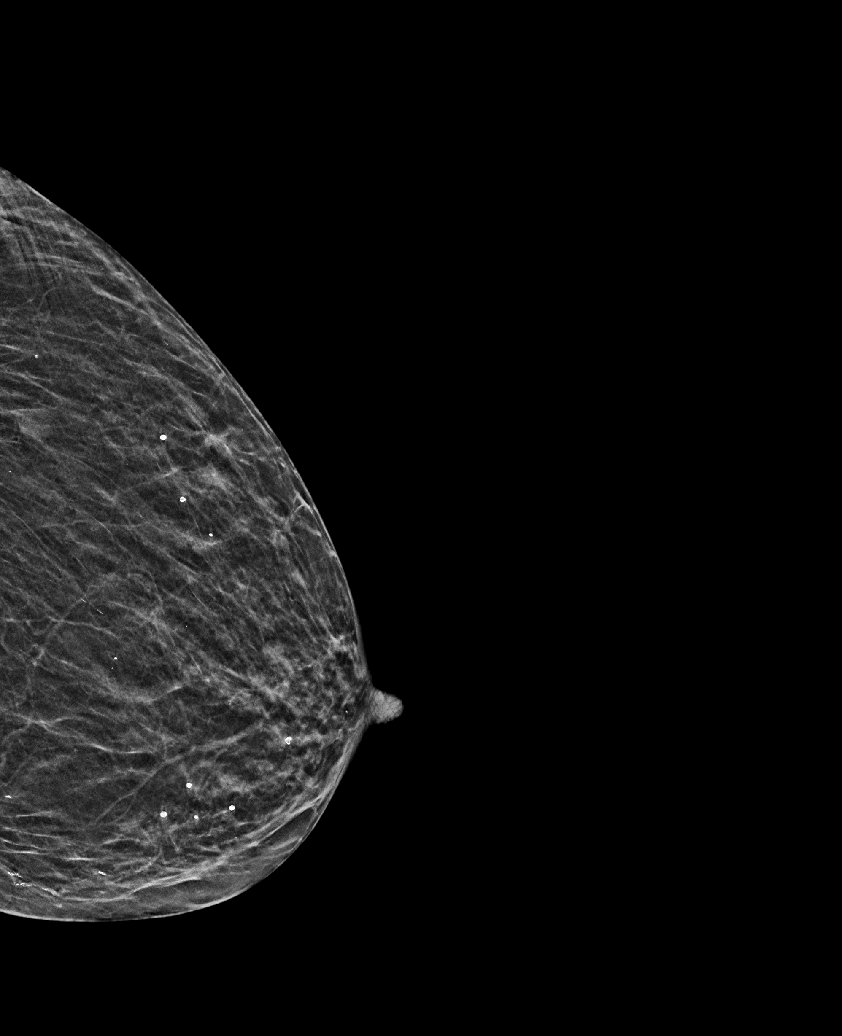

[L MLO synth-2D]
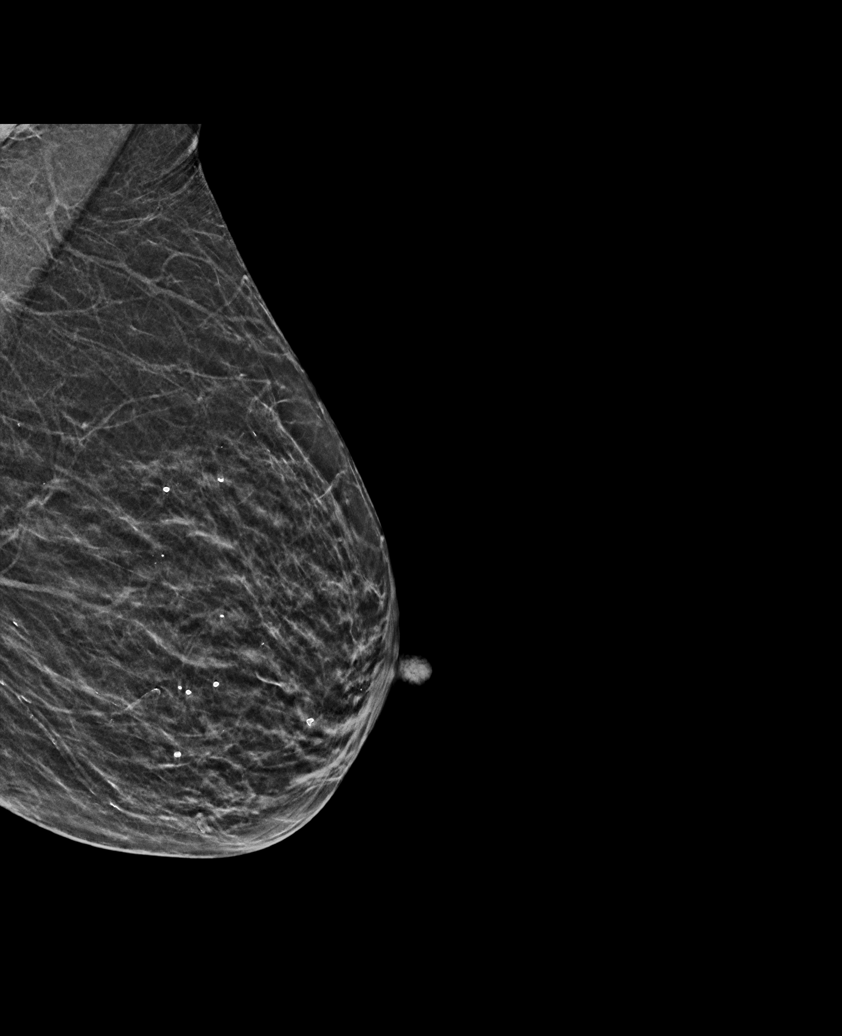

[6 of 36 positions shown; findings below may reference images not displayed]

ACR Breast Density Category b: There are scattered areas of
fibroglandular density.
FINDINGS: There are no findings suspicious for malignancy. Images were
processed with CAD.
IMPRESSION: No mammographic evidence of malignancy. A result letter of this
screening mammogram will be mailed directly to the patient.

RECOMMENDATION:
Screening mammogram in one year. (Code:[id number])

BI-RADS CATEGORY  1: Negative.
